# Patient Record
Sex: Male | Born: 1950 | Race: Black or African American | Hispanic: No | Marital: Single | Smoking: Never smoker
Health system: Southern US, Community
[De-identification: ages and names within clinical notes are randomized; demographics above are authoritative.]

## PROBLEM LIST (undated history)

## (undated) DIAGNOSIS — M1990 Unspecified osteoarthritis, unspecified site: Secondary | ICD-10-CM

## (undated) DIAGNOSIS — G4730 Sleep apnea, unspecified: Secondary | ICD-10-CM

## (undated) DIAGNOSIS — N529 Male erectile dysfunction, unspecified: Secondary | ICD-10-CM

## (undated) DIAGNOSIS — I509 Heart failure, unspecified: Secondary | ICD-10-CM

## (undated) DIAGNOSIS — E119 Type 2 diabetes mellitus without complications: Secondary | ICD-10-CM

## (undated) DIAGNOSIS — Z87442 Personal history of urinary calculi: Secondary | ICD-10-CM

## (undated) DIAGNOSIS — I10 Essential (primary) hypertension: Secondary | ICD-10-CM

## (undated) HISTORY — DX: Male erectile dysfunction, unspecified: N52.9

## (undated) HISTORY — PX: RENAL ARTERY STENT: SHX2321

## (undated) HISTORY — PX: JOINT REPLACEMENT: SHX530

## (undated) HISTORY — PX: CARDIAC CATHETERIZATION: SHX172

## (undated) HISTORY — DX: Heart failure, unspecified: I50.9

---

## 2001-02-12 HISTORY — PX: CORONARY ARTERY BYPASS GRAFT: SHX141

## 2014-07-28 ENCOUNTER — Encounter: Payer: Self-pay | Admitting: Emergency Medicine

## 2014-07-28 ENCOUNTER — Emergency Department
Admission: EM | Admit: 2014-07-28 | Discharge: 2014-07-28 | Disposition: A | Discharge status: Home / Self Care | Payer: Self-pay | Attending: Emergency Medicine | Admitting: Emergency Medicine

## 2014-07-28 DIAGNOSIS — Y9289 Other specified places as the place of occurrence of the external cause: Secondary | ICD-10-CM | POA: Insufficient documentation

## 2014-07-28 DIAGNOSIS — E119 Type 2 diabetes mellitus without complications: Secondary | ICD-10-CM | POA: Insufficient documentation

## 2014-07-28 DIAGNOSIS — S91202A Unspecified open wound of left great toe with damage to nail, initial encounter: Secondary | ICD-10-CM | POA: Insufficient documentation

## 2014-07-28 DIAGNOSIS — X58XXXA Exposure to other specified factors, initial encounter: Secondary | ICD-10-CM | POA: Insufficient documentation

## 2014-07-28 DIAGNOSIS — I10 Essential (primary) hypertension: Secondary | ICD-10-CM | POA: Insufficient documentation

## 2014-07-28 DIAGNOSIS — Y998 Other external cause status: Secondary | ICD-10-CM | POA: Insufficient documentation

## 2014-07-28 DIAGNOSIS — S91209A Unspecified open wound of unspecified toe(s) with damage to nail, initial encounter: Secondary | ICD-10-CM

## 2014-07-28 DIAGNOSIS — Y9389 Activity, other specified: Secondary | ICD-10-CM | POA: Insufficient documentation

## 2014-07-28 HISTORY — DX: Type 2 diabetes mellitus without complications: E11.9

## 2014-07-28 HISTORY — DX: Essential (primary) hypertension: I10

## 2014-07-28 MED ORDER — BACITRACIN-NEOMYCIN-POLYMYXIN OINTMENT TUBE
TOPICAL_OINTMENT | Freq: Once | CUTANEOUS | Status: AC
  Administered 2014-07-28: 1 via TOPICAL

## 2014-07-28 MED ORDER — SULFAMETHOXAZOLE-TRIMETHOPRIM 800-160 MG PO TABS
1.0000 | ORAL_TABLET | Freq: Once | ORAL | Status: AC
  Administered 2014-07-28: 1 via ORAL

## 2014-07-28 MED ORDER — BACITRACIN-NEOMYCIN-POLYMYXIN 400-5-5000 EX OINT
TOPICAL_OINTMENT | CUTANEOUS | Status: AC
  Filled 2014-07-28: qty 1

## 2014-07-28 MED ORDER — SULFAMETHOXAZOLE-TRIMETHOPRIM 800-160 MG PO TABS: 1.0000 | ORAL_TABLET | Freq: Two times a day (BID) | ORAL | Status: DC

## 2014-07-28 MED ORDER — SULFAMETHOXAZOLE-TRIMETHOPRIM 800-160 MG PO TABS
ORAL_TABLET | ORAL | Status: AC
  Administered 2014-07-28: 1 via ORAL
  Filled 2014-07-28: qty 1

## 2014-07-28 MED ORDER — NEOMYCIN-POLYMYXIN-PRAMOXINE 1 % EX CREA: TOPICAL_CREAM | Freq: Two times a day (BID) | CUTANEOUS | Status: DC

## 2014-07-28 NOTE — ED Notes (Signed)
Pt reports ingrown toenail 2 weeks ago, reports "ripping toenail off" about a week ago. Pt reports pain to left big toe, toe is bleeding. Bandage applied in triage.

## 2014-07-28 NOTE — Discharge Instructions (Signed)
Fingernail or Toenail Loss All or part of your fingernail or toenail has been lost. This may or may not grow back as a normal nail. A special non-stick bandage has been put on your finger or toe tightly to prevent bleeding. HOME CARE INSTRUCTIONS  The tips of fingers and toes are full of nerves and injuries are often very painful. The following will help you decrease the pain and obtain the best outcome.  Keep your hand or foot elevated above your heart to relieve pain and swelling. This will require lying in bed or on a couch with the hand or leg on pillows or sitting in a recliner with the leg up. Letting your hand or leg dangle may increase swelling, slow healing and cause throbbing pain.  Keep your dressing dry and clean.  Change your bandage in 24 hours after going home.  After your bandage is changed, soak your hand or foot in warm soapy water for 10 to 20 minutes. Do this 3 times per day. This helps reduce pain and swelling. After soaking, apply a clean, dry bandage. Change your bandage if it is wet or dirty.  Only take over-the-counter or prescription medicines for pain, discomfort, or fever as directed by your caregiver.  See your caregiver as needed for problems. SEEK IMMEDIATE MEDICAL CARE IF:   You have increased pain, swelling, drainage, or bleeding.  You have a fever. MAKE SURE YOU:   Understand these instructions.  Will watch your condition.  Will get help right away if you are not doing well or get worse. Document Released: 12/21/2005 Document Revised: 04/23/2011 Document Reviewed: 03/12/2006 Exit[employer] Patient Information 2015 [employer]. This information is not intended to replace advice given to you by your health care provider. Make sure you discuss any questions you have with your health care provider.  

## 2014-07-28 NOTE — ED Provider Notes (Signed)
[facility] Emergency Department Provider Note  ____________________________________________  Time seen: Approximately 7:37 PM  I have reviewed the triage vital signs and the nursing notes.   HISTORY  Chief Complaint Toe Pain    HPI Scott Hale is a 65 y.o. male patient's report have an ingrown toenail 2 weeks ago and he sized to take it out himself. Patient removed the whole nail on the left great toe. They he will he was healing well but he stopped bothering today and it started bleeding. He also noticed that there was more edema to the toe. Patient denies any pain at this time but he believes might be secondary to his neuropathy. Patient denies any fever or chills secondary to this complaint.   Past Medical History  Diagnosis Date  . Hypertension   . Diabetes mellitus without complication     There are no active problems to display for this patient.   Past Surgical History  Procedure Laterality Date  . Coronary artery bypass graft    . Joint replacement      Current Outpatient Rx  Name  Route  Sig  Dispense  Refill  . neomycin-polymyxin-pramoxine (NEOSPORIN PLUS) 1 % cream   Topical   Apply topically 2 (two) times daily.   14.2 g   0   . sulfamethoxazole-trimethoprim (BACTRIM DS,SEPTRA DS) 800-160 MG per tablet   Oral   Take 1 tablet by mouth 2 (two) times daily.   20 tablet   0     Allergies Review of patient's allergies indicates no known allergies.  No family history on file.  Social History History  Substance Use Topics  . Smoking status: Never Smoker   . Smokeless tobacco: Not on file  . Alcohol Use: No    Review of Systems Constitutional: No fever/chills Eyes: No visual changes. ENT: No sore throat. Cardiovascular: Denies chest pain. Respiratory: Denies shortness of breath. Gastrointestinal: No abdominal pain.  No nausea, no vomiting.  No diarrhea.  No constipation. Genitourinary: Negative for  dysuria. Musculoskeletal: Negative for back pain. Skin: Negative for rash. Erythema and edema to the left great toe. Neurological: Negative for headaches, focal weakness or numbness. Endocrine:Hypertension and diabetes  10-point ROS otherwise negative.  ____________________________________________   PHYSICAL EXAM:  VITAL SIGNS: ED Triage Vitals  Enc Vitals Group     BP 07/28/14 1921 147/95 mmHg     Pulse Rate 07/28/14 1921 82     Resp 07/28/14 1921 19     Temp 07/28/14 1921 98.4 F (36.9 C)     Temp Source 07/28/14 1921 Oral     SpO2 07/28/14 1921 100 %     Weight 07/28/14 1921 237 lb (107.502 kg)     Height 07/28/14 1921 6' (1.829 m)     Head Cir --      Peak Flow --      Pain Score 07/28/14 1922 0     Pain Loc --      Pain Edu? --      Excl. in GC? --    Constitutional: Alert and oriented. Well appearing and in no acute distress. Eyes: Conjunctivae are normal. PERRL. EOMI. Head: Atraumatic. Nose: No congestion/rhinnorhea. Mouth/Throat: Mucous membranes are moist.  Oropharynx non-erythematous. Neck: No stridor.  No deformity for nuchal range of motion nontender palpation. Hematological/Lymphatic/Immunilogical: No cervical lymphadenopathy. Cardiovascular: Normal rate, regular rhythm. Grossly normal heart sounds.  Good peripheral circulation. Respiratory: Normal respiratory effort.  No retractions. Lungs CTAB. Gastrointestinal: Soft and nontender. No distention.  No abdominal bruits. No CVA tenderness. Musculoskeletal: No lower extremity tenderness nor edema.  No joint effusions. Neurologic:  Normal speech and language. No gross focal neurologic deficits are appreciated. Speech is normal. No gait instability. Skin:  Skin is warm, dry and intact. No rash noted. Complete avulsion of the nail left great toe. There is erythema and edema. Psychiatric: Mood and affect are normal. Speech and behavior are normal.  ____________________________________________   LABS (all labs  ordered are listed, but only abnormal results are displayed)  Labs Reviewed - No data to display ____________________________________________  EKG   ____________________________________________  RADIOLOGY   ____________________________________________   PROCEDURES  Procedure(s) performed: None  Critical Care performed: No  ____________________________________________   INITIAL IMPRESSION / ASSESSMENT AND PLAN / ED COURSE  Pertinent labs & imaging results that were available during my care of the patient were reviewed by me and considered in my medical decision making (see chart for details).  Avulsion of the great left toe nail with cellulitis. ____________________________________________   FINAL CLINICAL IMPRESSION(S) / ED DIAGNOSES  Final diagnoses:  Nail avulsion of toe, initial encounter      Joni Reining, PA-C 07/28/14 1949  Maurilio Lovely, MD 07/29/14 9147

## 2015-12-26 DIAGNOSIS — E785 Hyperlipidemia, unspecified: Secondary | ICD-10-CM | POA: Diagnosis not present

## 2015-12-26 DIAGNOSIS — E119 Type 2 diabetes mellitus without complications: Secondary | ICD-10-CM | POA: Diagnosis not present

## 2015-12-26 DIAGNOSIS — M791 Myalgia: Secondary | ICD-10-CM | POA: Diagnosis not present

## 2015-12-26 DIAGNOSIS — I10 Essential (primary) hypertension: Secondary | ICD-10-CM | POA: Diagnosis not present

## 2015-12-26 DIAGNOSIS — Z125 Encounter for screening for malignant neoplasm of prostate: Secondary | ICD-10-CM | POA: Diagnosis not present

## 2015-12-27 DIAGNOSIS — Z125 Encounter for screening for malignant neoplasm of prostate: Secondary | ICD-10-CM | POA: Diagnosis not present

## 2015-12-27 DIAGNOSIS — E785 Hyperlipidemia, unspecified: Secondary | ICD-10-CM | POA: Diagnosis not present

## 2015-12-27 DIAGNOSIS — E119 Type 2 diabetes mellitus without complications: Secondary | ICD-10-CM | POA: Diagnosis not present

## 2015-12-27 DIAGNOSIS — I10 Essential (primary) hypertension: Secondary | ICD-10-CM | POA: Diagnosis not present

## 2016-02-09 DIAGNOSIS — D649 Anemia, unspecified: Secondary | ICD-10-CM | POA: Diagnosis not present

## 2016-03-19 DIAGNOSIS — E785 Hyperlipidemia, unspecified: Secondary | ICD-10-CM | POA: Diagnosis not present

## 2016-03-19 DIAGNOSIS — R5383 Other fatigue: Secondary | ICD-10-CM | POA: Diagnosis not present

## 2016-03-19 DIAGNOSIS — I10 Essential (primary) hypertension: Secondary | ICD-10-CM | POA: Diagnosis not present

## 2016-03-19 DIAGNOSIS — E119 Type 2 diabetes mellitus without complications: Secondary | ICD-10-CM | POA: Diagnosis not present

## 2016-03-19 DIAGNOSIS — N529 Male erectile dysfunction, unspecified: Secondary | ICD-10-CM | POA: Diagnosis not present

## 2016-03-19 DIAGNOSIS — R5381 Other malaise: Secondary | ICD-10-CM | POA: Diagnosis not present

## 2016-03-23 DIAGNOSIS — E349 Endocrine disorder, unspecified: Secondary | ICD-10-CM | POA: Diagnosis not present

## 2016-10-22 DIAGNOSIS — H52213 Irregular astigmatism, bilateral: Secondary | ICD-10-CM | POA: Diagnosis not present

## 2016-10-22 DIAGNOSIS — E113393 Type 2 diabetes mellitus with moderate nonproliferative diabetic retinopathy without macular edema, bilateral: Secondary | ICD-10-CM | POA: Diagnosis not present

## 2016-11-05 DIAGNOSIS — E1159 Type 2 diabetes mellitus with other circulatory complications: Secondary | ICD-10-CM | POA: Diagnosis not present

## 2016-11-11 ENCOUNTER — Emergency Department
Admission: EM | Admit: 2016-11-11 | Discharge: 2016-11-11 | Disposition: A | Discharge status: Home / Self Care | Payer: Medicare HMO | Attending: Emergency Medicine | Admitting: Emergency Medicine

## 2016-11-11 DIAGNOSIS — E119 Type 2 diabetes mellitus without complications: Secondary | ICD-10-CM | POA: Insufficient documentation

## 2016-11-11 DIAGNOSIS — X500XXA Overexertion from strenuous movement or load, initial encounter: Secondary | ICD-10-CM | POA: Diagnosis not present

## 2016-11-11 DIAGNOSIS — S76012A Strain of muscle, fascia and tendon of left hip, initial encounter: Secondary | ICD-10-CM | POA: Insufficient documentation

## 2016-11-11 DIAGNOSIS — Z9660 Presence of unspecified orthopedic joint implant: Secondary | ICD-10-CM | POA: Diagnosis not present

## 2016-11-11 DIAGNOSIS — G5702 Lesion of sciatic nerve, left lower limb: Secondary | ICD-10-CM | POA: Diagnosis not present

## 2016-11-11 DIAGNOSIS — M5432 Sciatica, left side: Secondary | ICD-10-CM

## 2016-11-11 DIAGNOSIS — Y999 Unspecified external cause status: Secondary | ICD-10-CM | POA: Insufficient documentation

## 2016-11-11 DIAGNOSIS — Y93B1 Activity, exercise machines primarily for muscle strengthening: Secondary | ICD-10-CM | POA: Insufficient documentation

## 2016-11-11 DIAGNOSIS — Y9239 Other specified sports and athletic area as the place of occurrence of the external cause: Secondary | ICD-10-CM | POA: Diagnosis not present

## 2016-11-11 DIAGNOSIS — I25810 Atherosclerosis of coronary artery bypass graft(s) without angina pectoris: Secondary | ICD-10-CM | POA: Insufficient documentation

## 2016-11-11 DIAGNOSIS — S3992XA Unspecified injury of lower back, initial encounter: Secondary | ICD-10-CM | POA: Diagnosis present

## 2016-11-11 DIAGNOSIS — S76312A Strain of muscle, fascia and tendon of the posterior muscle group at thigh level, left thigh, initial encounter: Secondary | ICD-10-CM | POA: Diagnosis not present

## 2016-11-11 DIAGNOSIS — I10 Essential (primary) hypertension: Secondary | ICD-10-CM | POA: Diagnosis not present

## 2016-11-11 MED ORDER — KETOROLAC TROMETHAMINE 10 MG PO TABS: 10.0000 mg | ORAL_TABLET | Freq: Three times a day (TID) | ORAL | 0 refills | Status: AC

## 2016-11-11 MED ORDER — BACLOFEN 5 MG PO TABS: 5.0000 mg | ORAL_TABLET | Freq: Three times a day (TID) | ORAL | 0 refills | Status: AC | PRN

## 2016-11-11 MED ORDER — KETOROLAC TROMETHAMINE 60 MG/2ML IM SOLN
30.0000 mg | Freq: Once | INTRAMUSCULAR | Status: AC
  Administered 2016-11-11: 30 mg via INTRAMUSCULAR
  Filled 2016-11-11: qty 2

## 2016-11-11 NOTE — ED Triage Notes (Signed)
Patient arrives c/o non-traumatic low back pain, described as "spasms"

## 2016-11-11 NOTE — Discharge Instructions (Signed)
Your exam is consistent with a strain to the muscles of the gluteal region, specifically, the piriformis muscle. Take the prescription meds as directed. Follow-up with ortho for continued symptoms. Perform the stretches as demonstrated.

## 2016-11-11 NOTE — ED Provider Notes (Signed)
[facility] Emergency Department Provider Note ____________________________________________  Time seen: 1903  I have reviewed the triage vital signs and the nursing notes.  HISTORY  Chief Complaint  Back Pain  HPI Scott Hale is a 66 y.o. male Presents to the ED for evaluation of pain to the low back and left high that he describes as spasms. The patient denies any frank injury, accident, or fall. He does admit to onset a few days after he was doing some leg presses and leg curls in the gym. He localizes pain to his left buttocks region with referral down the posterior lateral thigh. He describes pain is worse with transitioning from sitting to standing.He also describes intermittent catchy pain in the same region. He denies any distal paresthesias, foot drop, or incontinence. He has taken a couple of intermittent doses of Aleve without any lasting benefit.He denies any history of intermittent, chronic, ongoing back pain.  Past Medical History:  Diagnosis Date  . Diabetes mellitus without complication   . Hypertension    There are no active problems to display for this patient.  Past Surgical History:  Procedure Laterality Date  . CORONARY ARTERY BYPASS GRAFT    . JOINT REPLACEMENT      Prior to Admission medications   Medication Sig Start Date End Date Taking? Authorizing Provider  neomycin-polymyxin-pramoxine (NEOSPORIN PLUS) 1 % cream Apply topically 2 (two) times daily. 07/28/14   Joni Reining, PA-C  sulfamethoxazole-trimethoprim (BACTRIM DS,SEPTRA DS) 800-160 MG per tablet Take 1 tablet by mouth 2 (two) times daily. 07/28/14   Joni Reining, PA-C   Allergies Patient has no known allergies.  No family history on file.  Social History Social History  Substance Use Topics  . Smoking status: Never Smoker  . Smokeless tobacco: Not on file  . Alcohol use No    Review of Systems  Constitutional: Negative for fever. Cardiovascular: Negative for  chest pain. Respiratory: Negative for shortness of breath. Genitourinary: Negative for dysuria. Musculoskeletal: Negative for back pain. Left hip/leg pain as above Skin: Negative for rash. Neurological: Negative for headaches, focal weakness or numbness. ____________________________________________  PHYSICAL EXAM:  VITAL SIGNS: ED Triage Vitals  Enc Vitals Group     BP 11/11/16 1826 (!) 179/93     Pulse Rate 11/11/16 1826 (!) 59     Resp 11/11/16 1826 18     Temp 11/11/16 1826 99.1 F (37.3 C)     Temp Source 11/11/16 1826 Oral     SpO2 11/11/16 1826 98 %     Weight --      Height --      Head Circumference --      Peak Flow --      Pain Score 11/11/16 1824 8     Pain Loc --      Pain Edu? --      Excl. in GC? --     Constitutional: Alert and oriented. Well appearing and in no distress. Head: Normocephalic and atraumatic. Eyes: Conjunctivae are normal. Normal extraocular movements Cardiovascular: Normal rate, regular rhythm. Normal distal pulses. Respiratory: Normal respiratory effort. No wheezes/rales/rhonchi. Gastrointestinal: Soft and nontender. No distention. Musculoskeletal: normal spinal alignment without midline tenderness, spasm, deformity, or step-off. Patient with tenderness to palpation over the left mid gluteal region. He is tender to palpation over the lateral left hip as well. Pain is increased with hip flexion as well as supine straight leg raise. Nontender with normal range of motion in all extremities.  Neurologic:  Normal gait without ataxia. Normal speech and language. No gross focal neurologic deficits are appreciated. ___________________________________________   RADIOLOGY  deferred ____________________________________________  PROCEDURES  Toradol 30 mg IM ____________________________________________  INITIAL IMPRESSION / ASSESSMENT AND PLAN / ED COURSE  Patient ED evaluation of an acute piriformis syndrome on the left. He is discharged with a  prescription for ketorolac and baclofen to dose as directed. He will follow up with orthopod ongoing or worsening symptoms. ____________________________________________  FINAL CLINICAL IMPRESSION(S) / ED DIAGNOSES  Final diagnoses:  Muscle strain of gluteal region, left, initial encounter  Sciatica of left side  Piriformis syndrome of left side      Karmen Stabs, Charlesetta Ivory, PA-C 11/11/16 2044    Dionne Bucy, MD 11/12/16 [phone]

## 2016-11-12 ENCOUNTER — Telehealth: Payer: Self-pay | Admitting: Emergency Medicine

## 2016-11-12 NOTE — Telephone Encounter (Signed)
[facility] [place] [facility] called asking for alternative to toradol due to  Insurance coverage.  Per dr Roxan Hockey can changed to naproxen 375 mg twice daily for 10 days.

## 2017-03-13 ENCOUNTER — Telehealth: Payer: Self-pay | Admitting: Gastroenterology

## 2017-03-13 NOTE — Telephone Encounter (Signed)
Patient called to schedule colonoscopy.

## 2017-03-14 ENCOUNTER — Other Ambulatory Visit: Payer: Self-pay

## 2017-03-14 DIAGNOSIS — Z1211 Encounter for screening for malignant neoplasm of colon: Secondary | ICD-10-CM

## 2017-03-19 NOTE — Addendum Note (Signed)
Addended by: Ardyth ManARTER, Contessa Preuss Z on: 03/19/2017 10:34 AM   Modules accepted: Orders

## 2017-03-19 NOTE — Progress Notes (Signed)
gasrt

## 2017-03-24 ENCOUNTER — Emergency Department: Payer: Medicare HMO

## 2017-03-24 ENCOUNTER — Emergency Department
Admission: EM | Admit: 2017-03-24 | Discharge: 2017-03-24 | Disposition: A | Discharge status: Home / Self Care | Payer: Medicare HMO | Attending: Emergency Medicine | Admitting: Emergency Medicine

## 2017-03-24 ENCOUNTER — Encounter: Payer: Self-pay | Admitting: Emergency Medicine

## 2017-03-24 DIAGNOSIS — Z79899 Other long term (current) drug therapy: Secondary | ICD-10-CM | POA: Insufficient documentation

## 2017-03-24 DIAGNOSIS — I10 Essential (primary) hypertension: Secondary | ICD-10-CM | POA: Insufficient documentation

## 2017-03-24 DIAGNOSIS — R0602 Shortness of breath: Secondary | ICD-10-CM | POA: Insufficient documentation

## 2017-03-24 DIAGNOSIS — E119 Type 2 diabetes mellitus without complications: Secondary | ICD-10-CM | POA: Insufficient documentation

## 2017-03-24 DIAGNOSIS — R7989 Other specified abnormal findings of blood chemistry: Secondary | ICD-10-CM | POA: Diagnosis not present

## 2017-03-24 DIAGNOSIS — R21 Rash and other nonspecific skin eruption: Secondary | ICD-10-CM | POA: Diagnosis not present

## 2017-03-24 DIAGNOSIS — Z794 Long term (current) use of insulin: Secondary | ICD-10-CM | POA: Diagnosis not present

## 2017-03-24 DIAGNOSIS — Z7982 Long term (current) use of aspirin: Secondary | ICD-10-CM | POA: Diagnosis not present

## 2017-03-24 DIAGNOSIS — Z951 Presence of aortocoronary bypass graft: Secondary | ICD-10-CM | POA: Insufficient documentation

## 2017-03-24 DIAGNOSIS — B356 Tinea cruris: Secondary | ICD-10-CM | POA: Diagnosis not present

## 2017-03-24 LAB — COMPREHENSIVE METABOLIC PANEL
ALK PHOS: 62 U/L (ref 38–126)
ALT: 23 U/L (ref 17–63)
ANION GAP: 8 (ref 5–15)
AST: 28 U/L (ref 15–41)
Albumin: 3.8 g/dL (ref 3.5–5.0)
BILIRUBIN TOTAL: 0.7 mg/dL (ref 0.3–1.2)
BUN: 10 mg/dL (ref 6–20)
CALCIUM: 9.2 mg/dL (ref 8.9–10.3)
CO2: 25 mmol/L (ref 22–32)
Chloride: 105 mmol/L (ref 101–111)
Creatinine, Ser: 1.2 mg/dL (ref 0.61–1.24)
GFR calc Af Amer: >60 mL/min (ref >60)
GFR calc non Af Amer: >60 mL/min (ref >60)
Glucose, Bld: 134 mg/dL — ABNORMAL HIGH (ref 65–99)
Potassium: 4 mmol/L (ref 3.5–5.1)
Sodium: 138 mmol/L (ref 135–145)
TOTAL PROTEIN: 7.5 g/dL (ref 6.5–8.1)

## 2017-03-24 LAB — CBC
HEMATOCRIT: 38.7 % — AB (ref 40.0–52.0)
Hemoglobin: 12.8 g/dL — ABNORMAL LOW (ref 13.0–18.0)
MCH: 29.2 pg (ref 26.0–34.0)
MCHC: 33.2 g/dL (ref 32.0–36.0)
MCV: 87.9 fL (ref 80.0–100.0)
Platelets: 211 10*3/uL (ref 150–440)
RBC: 4.4 MIL/uL (ref 4.40–5.90)
RDW: 14 % (ref 11.5–14.5)
WBC: 6.2 10*3/uL (ref 3.8–10.6)

## 2017-03-24 LAB — BRAIN NATRIURETIC PEPTIDE: B Natriuretic Peptide: 185 pg/mL — ABNORMAL HIGH (ref 0.0–100.0)

## 2017-03-24 MED ORDER — CLOTRIMAZOLE-BETAMETHASONE 1-0.05 % EX CREA: TOPICAL_CREAM | CUTANEOUS | 0 refills | Status: DC

## 2017-03-24 NOTE — ED Provider Notes (Signed)
EKG read and interpreted by me shows normal sinus rhythm rate of 60 normal axis nonspecific ST-T wave changes   Arnaldo NatalMalinda, Paul F, MD 03/24/17 1242

## 2017-03-24 NOTE — ED Notes (Signed)
FIRST NURSE NOTE: pt states he tried some just for men and is having a rash, no noted hives or distress on arrival.

## 2017-03-24 NOTE — ED Triage Notes (Signed)
Pt reports using "just for men" and reports rash to face. Also reports starting a new diabetic medication and now has discoloration to his right upper thigh.

## 2017-03-24 NOTE — ED Provider Notes (Signed)
[facility] Emergency Department Provider Note  ____________________________________________  Time seen: Approximately 1:40 PM  I have reviewed the triage vital signs and the nursing notes.   HISTORY  Chief Complaint Rash    HPI Scott Hale is a 67 y.o. male that presents emergency department for evaluation of "scarring" to chin from beard dye for several weeks and rash to right inner thigh for 1 week.  Patient states that he saw a commercial on TV that he should get documentation if your beard dye is causing problems.  He has been using this dye for "a long time."  He also has a rash on his inner thigh that itches.  He has never had this before.  He also wants to discuss that for several months he also is noticing increased shortness of breath with exercise. Sometimes he gets out of breath just walking to the mailbox. He has not had any shortness of breath today. He does not have any SOB at rest.  He exercises daily.  He does not smoke. No cough, CP, nausea, vomiting, abdominal pain.  Past Medical History:  Diagnosis Date  . Diabetes mellitus without complication (HCC)   . Hypertension     There are no active problems to display for this patient.   Past Surgical History:  Procedure Laterality Date  . CORONARY ARTERY BYPASS GRAFT    . JOINT REPLACEMENT      Prior to Admission medications   Medication Sig Start Date End Date Taking? Authorizing Provider  ASPIRIN 81 PO Take by mouth.    [provider]  atorvastatin (LIPITOR) 40 MG tablet TAKE 1 TABLET ONE TIME DAILY 08/07/16   [provider]  clotrimazole-betamethasone (LOTRISONE) cream Apply to affected area 2 times daily 03/24/17 03/24/18  Enid Derry, PA-C  CREATINE MONOHYDRATE PO Use 1 Scoop once daily.    [provider]  glipiZIDE (GLUCOTROL) 10 MG tablet TAKE 1 TABLET EVERY MORNING BEFORE BREAKFAST 08/07/16   [provider]  insulin detemir (LEVEMIR) 100  UNIT/ML injection Inject into the skin. 08/20/16   [provider]  lisinopril (PRINIVIL,ZESTRIL) 20 MG tablet Take by mouth. 03/19/16 03/19/17  [provider]  neomycin-polymyxin-pramoxine (NEOSPORIN PLUS) 1 % cream Apply topically 2 (two) times daily. 07/28/14   Joni Reining, PA-C  sulfamethoxazole-trimethoprim (BACTRIM DS,SEPTRA DS) 800-160 MG per tablet Take 1 tablet by mouth 2 (two) times daily. 07/28/14   Joni Reining, PA-C    Allergies Patient has no known allergies.  No family history on file.  Social History Social History   Tobacco Use  . Smoking status: Never Smoker  Substance Use Topics  . Alcohol use: No  . Drug use: No     Review of Systems  Constitutional: No fever/chills Cardiovascular: No chest pain. Respiratory: No cough.  Gastrointestinal: No abdominal pain.  No nausea, no vomiting.  Musculoskeletal: Negative for musculoskeletal pain. Skin: Negative for abrasions, lacerations, ecchymosis.   ____________________________________________   PHYSICAL EXAM:  VITAL SIGNS: ED Triage Vitals  Enc Vitals Group     BP 03/24/17 1104 (!) 189/84     Pulse Rate 03/24/17 1104 74     Resp 03/24/17 1104 18     Temp 03/24/17 1104 98.3 F (36.8 C)     Temp Source 03/24/17 1104 Oral     SpO2 03/24/17 1104 100 %     Weight 03/24/17 1112 244 lb (110.7 kg)     Height 03/24/17 1112 6' (1.829 m)  Head Circumference --      Peak Flow --      Pain Score 03/24/17 1112 2     Pain Loc --      Pain Edu? --      Excl. in GC? --      Constitutional: Alert and oriented. Well appearing and in no acute distress. Eyes: Conjunctivae are normal. PERRL. EOMI. Head: Atraumatic. No rash to face noted.  ENT:      Ears:      Nose: No congestion/rhinnorhea.      Mouth/Throat: Mucous membranes are moist.  Neck: No stridor.   Cardiovascular: Normal rate, regular rhythm.  Good peripheral circulation. Respiratory: Normal respiratory effort without tachypnea or  retractions. Lungs CTAB. Good air entry to the bases with no decreased or absent breath sounds. Gastrointestinal: Bowel sounds 4 quadrants. Soft and nontender to palpation. No guarding or rigidity. No palpable masses. No distention. Musculoskeletal: Full range of motion to all extremities. No gross deformities appreciated. Dark scaling patch to inside of right thigh.  No edema to legs. Neurologic:  Normal speech and language. No gross focal neurologic deficits are appreciated.  Skin:  Skin is warm, dry and intact. No rash to face noted.   ____________________________________________   LABS (all labs ordered are listed, but only abnormal results are displayed)  Labs Reviewed  BRAIN NATRIURETIC PEPTIDE - Abnormal; Notable for the following components:      Result Value   B Natriuretic Peptide 185.0 (*)    All other components within normal limits  CBC - Abnormal; Notable for the following components:   Hemoglobin 12.8 (*)    HCT 38.7 (*)    All other components within normal limits  COMPREHENSIVE METABOLIC PANEL - Abnormal; Notable for the following components:   Glucose, Bld 134 (*)    All other components within normal limits   ____________________________________________  EKG   ____________________________________________  RADIOLOGY Lexine Baton, personally viewed and evaluated these images (plain radiographs) as part of my medical decision making, as well as reviewing the written report by the radiologist.  No results found.  ____________________________________________    PROCEDURES  Procedure(s) performed:    Procedures    Medications - No data to display   ____________________________________________   INITIAL IMPRESSION / ASSESSMENT AND PLAN / ED COURSE  Pertinent labs & imaging results that were available during my care of the patient were reviewed by me and considered in my medical decision making (see chart for details).  Review of the [place] CSRS  was performed in accordance of the [facility] prior to dispensing any controlled drugs.   Patient presented to the emergency department for evaluation of "scarring" to face after using beard dye. No visible scarring.  Rash on thigh is consistent with tinea cruris.  Patient also mentioned that he has noticed increase of SOB recently. No SOB today.  BNP was elevated on lab work.  Patient will follow up this week with heart failure clinic.  No EKG changes.  Chest x-ray negative for acute cardiopulmonary processes.  Vital signs and exam are reassuring.  Patient will be discharged home with prescriptions for clotrimazole. Patient is to follow up with PCP, the heart failure clinic and dermatology as directed. Patient is given ED precautions to return to the ED for any worsening or new symptoms.     ____________________________________________  FINAL CLINICAL IMPRESSION(S) / ED DIAGNOSES  Final diagnoses:  Tinea cruris  Elevated brain natriuretic peptide (BNP) level  Rash  NEW MEDICATIONS STARTED DURING THIS VISIT:  ED Discharge Orders        Ordered    clotrimazole-betamethasone (LOTRISONE) cream     03/24/17 1520          This chart was dictated using voice recognition software/Dragon. Despite best efforts to proofread, errors can occur which can change the meaning. Any change was purely unintentional.    Enid DerryWagner, Jaiyden Laur, PA-C 03/25/17 1518    Jeanmarie PlantMcShane, James A, MD 03/27/17 [phone]

## 2017-03-26 ENCOUNTER — Encounter: Payer: Self-pay | Admitting: Family

## 2017-03-26 ENCOUNTER — Ambulatory Visit: Payer: Medicare HMO | Attending: Family | Admitting: Family

## 2017-03-26 VITALS — BP 161/82 | HR 63 | Resp 18 | Ht 72.0 in | Wt 238.5 lb

## 2017-03-26 DIAGNOSIS — I509 Heart failure, unspecified: Secondary | ICD-10-CM | POA: Diagnosis not present

## 2017-03-26 DIAGNOSIS — Z794 Long term (current) use of insulin: Secondary | ICD-10-CM | POA: Diagnosis not present

## 2017-03-26 DIAGNOSIS — Z811 Family history of alcohol abuse and dependence: Secondary | ICD-10-CM | POA: Insufficient documentation

## 2017-03-26 DIAGNOSIS — Z9660 Presence of unspecified orthopedic joint implant: Secondary | ICD-10-CM | POA: Insufficient documentation

## 2017-03-26 DIAGNOSIS — Z833 Family history of diabetes mellitus: Secondary | ICD-10-CM | POA: Insufficient documentation

## 2017-03-26 DIAGNOSIS — Z79899 Other long term (current) drug therapy: Secondary | ICD-10-CM | POA: Diagnosis not present

## 2017-03-26 DIAGNOSIS — E119 Type 2 diabetes mellitus without complications: Secondary | ICD-10-CM

## 2017-03-26 DIAGNOSIS — Z951 Presence of aortocoronary bypass graft: Secondary | ICD-10-CM | POA: Insufficient documentation

## 2017-03-26 DIAGNOSIS — I10 Essential (primary) hypertension: Secondary | ICD-10-CM

## 2017-03-26 DIAGNOSIS — Z8249 Family history of ischemic heart disease and other diseases of the circulatory system: Secondary | ICD-10-CM | POA: Insufficient documentation

## 2017-03-26 DIAGNOSIS — N529 Male erectile dysfunction, unspecified: Secondary | ICD-10-CM | POA: Insufficient documentation

## 2017-03-26 DIAGNOSIS — I110 Hypertensive heart disease with heart failure: Secondary | ICD-10-CM | POA: Diagnosis not present

## 2017-03-26 DIAGNOSIS — Z7982 Long term (current) use of aspirin: Secondary | ICD-10-CM | POA: Insufficient documentation

## 2017-03-26 DIAGNOSIS — R0683 Snoring: Secondary | ICD-10-CM | POA: Diagnosis not present

## 2017-03-26 NOTE — Patient Instructions (Addendum)
Continue weighing daily and call for an overnight weight gain of > 2 pounds or a weekly weight gain of >5 pounds.   Follow up with Dr. Lady Hale on 04/10/2017 at 2:45pm    Low-Sodium Eating Plan Sodium, which is an element that makes up salt, helps you maintain a healthy balance of fluids in your body. Too much sodium can increase your blood pressure and cause fluid and waste to be held in your body. Your health care provider or dietitian may recommend following this plan if you have high blood pressure (hypertension), kidney disease, liver disease, or heart failure. Eating less sodium can help lower your blood pressure, reduce swelling, and protect your heart, liver, and kidneys. What are tips for following this plan? General guidelines  Most people on this plan should limit their sodium intake to 2,000 mg (milligrams) of sodium each day. Reading food labels  The Nutrition Facts label lists the amount of sodium in one serving of the food. If you eat more than one serving, you must multiply the listed amount of sodium by the number of servings.  Choose foods with less than 140 mg of sodium per serving.  Avoid foods with 300 mg of sodium or more per serving. Shopping  Look for lower-sodium products, often labeled as "low-sodium" or "no salt added."  Always check the sodium content even if foods are labeled as "unsalted" or "no salt added".  Buy fresh foods. ? Avoid canned foods and premade or frozen meals. ? Avoid canned, cured, or processed meats  Buy breads that have less than 80 mg of sodium per slice. Cooking  Eat more home-cooked food and less restaurant, buffet, and fast food.  Avoid adding salt when cooking. Use salt-free seasonings or herbs instead of table salt or sea salt. Check with your health care provider or pharmacist before using salt substitutes.  Cook with plant-based oils, such as canola, sunflower, or olive oil. Meal planning  When eating at a restaurant, ask that  your food be prepared with less salt or no salt, if possible.  Avoid foods that contain MSG (monosodium glutamate). MSG is sometimes added to [place] food, bouillon, and some canned foods. What foods are recommended? The items listed may not be a complete list. Talk with your dietitian about what dietary choices are best for you. Grains Low-sodium cereals, including oats, puffed wheat and rice, and shredded wheat. Low-sodium crackers. Unsalted rice. Unsalted pasta. Low-sodium bread. Whole-grain breads and whole-grain pasta. Vegetables Fresh or frozen vegetables. "No salt added" canned vegetables. "No salt added" tomato sauce and paste. Low-sodium or reduced-sodium tomato and vegetable juice. Fruits Fresh, frozen, or canned fruit. Fruit juice. Meats and other protein foods Fresh or frozen (no salt added) meat, poultry, seafood, and fish. Low-sodium canned tuna and salmon. Unsalted nuts. Dried peas, beans, and lentils without added salt. Unsalted canned beans. Eggs. Unsalted nut butters. Dairy Milk. Soy milk. Cheese that is naturally low in sodium, such as ricotta cheese, fresh mozzarella, or Swiss cheese Low-sodium or reduced-sodium cheese. Cream cheese. Yogurt. Fats and oils Unsalted butter. Unsalted margarine with no trans fat. Vegetable oils such as canola or olive oils. Seasonings and other foods Fresh and dried herbs and spices. Salt-free seasonings. Low-sodium mustard and ketchup. Sodium-free salad dressing. Sodium-free light mayonnaise. Fresh or refrigerated horseradish. Lemon juice. Vinegar. Homemade, reduced-sodium, or low-sodium soups. Unsalted popcorn and pretzels. Low-salt or salt-free chips. What foods are not recommended? The items listed may not be a complete list. Talk with your dietitian  about what dietary choices are best for you. Grains Instant hot cereals. Bread stuffing, pancake, and biscuit mixes. Croutons. Seasoned rice or pasta mixes. Noodle soup cups. Boxed or frozen  macaroni and cheese. Regular salted crackers. Self-rising flour. Vegetables Sauerkraut, pickled vegetables, and relishes. Olives. [place] fries. Onion rings. Regular canned vegetables (not low-sodium or reduced-sodium). Regular canned tomato sauce and paste (not low-sodium or reduced-sodium). Regular tomato and vegetable juice (not low-sodium or reduced-sodium). Frozen vegetables in sauces. Meats and other protein foods Meat or fish that is salted, canned, smoked, spiced, or pickled. Bacon, ham, sausage, hotdogs, corned beef, chipped beef, packaged lunch meats, salt pork, jerky, pickled herring, anchovies, regular canned tuna, sardines, salted nuts. Dairy Processed cheese and cheese spreads. Cheese curds. Blue cheese. Feta cheese. String cheese. Regular cottage cheese. Buttermilk. Canned milk. Fats and oils Salted butter. Regular margarine. Ghee. Bacon fat. Seasonings and other foods Onion salt, garlic salt, seasoned salt, table salt, and sea salt. Canned and packaged gravies. Worcestershire sauce. Tartar sauce. Barbecue sauce. Teriyaki sauce. Soy sauce, including reduced-sodium. Steak sauce. Fish sauce. Oyster sauce. Cocktail sauce. Horseradish that you find on the shelf. Regular ketchup and mustard. Meat flavorings and tenderizers. Bouillon cubes. Hot sauce and Tabasco sauce. Premade or packaged marinades. Premade or packaged taco seasonings. Relishes. Regular salad dressings. Salsa. Potato and tortilla chips. Corn chips and puffs. Salted popcorn and pretzels. Canned or dried soups. Pizza. Frozen entrees and pot pies. Summary  Eating less sodium can help lower your blood pressure, reduce swelling, and protect your heart, liver, and kidneys.  Most people on this plan should limit their sodium intake to 1,500-2,000 mg (milligrams) of sodium each day.  Canned, boxed, and frozen foods are high in sodium. Restaurant foods, fast foods, and pizza are also very high in sodium. You also get sodium by adding  salt to food.  Try to cook at home, eat more fresh fruits and vegetables, and eat less fast food, canned, processed, or prepared foods. This information is not intended to replace advice given to you by your health care provider. Make sure you discuss any questions you have with your health care provider. Document Released: 07/21/2001 Document Revised: 01/23/2016 Document Reviewed: 01/23/2016 Elsevier Interactive Patient Education  [employer].

## 2017-03-26 NOTE — Progress Notes (Signed)
Patient ID: Scott Hale, male    DOB: 1950/11/21, 67 y.o.   MRN: [id number]  HPI  Mr Strubel is a 67 y/o male with a history of diabetes, HTN and chronic heart failure.   There is no echo report.  Was in the ED 03/24/17 due to shortness of breath and a rash. BNP was elevated. He was treated and released.   He presents today for his initial visit with a chief complaint of minimal shortness of breath upon moderate exertion. He describes this as chronic having been present for several months with varying levels of severity. He has associated fatigue, light-headedness, intermittent chest pain and chronic difficulty sleeping along with this. He denies any cough, edema, abdominal distention or weight gain. Does endorse snoring.   Past Medical History:  Diagnosis Date  . CHF (congestive heart failure) (HCC)   . Diabetes mellitus without complication (HCC)   . Erectile dysfunction   . Hypertension    Past Surgical History:  Procedure Laterality Date  . CORONARY ARTERY BYPASS GRAFT  2003  . JOINT REPLACEMENT     Family History  Problem Relation Age of Onset  . Diabetes Mother   . Alcoholism Father   . Diabetes Sister   . Diabetes Sister   . Arrhythmia Sister   . Diabetes Sister    Social History   Tobacco Use  . Smoking status: Never Smoker  . Smokeless tobacco: Never Used  Substance Use Topics  . Alcohol use: No   No Known Allergies Prior to Admission medications   Medication Sig Start Date End Date Taking? Authorizing Provider  ASPIRIN 81 PO Take by mouth.   Yes [provider]  atorvastatin (LIPITOR) 40 MG tablet TAKE 1 TABLET ONE TIME DAILY 08/07/16  Yes [provider]  clotrimazole-betamethasone (LOTRISONE) cream Apply to affected area 2 times daily 03/24/17 03/24/18 Yes Enid Derry, PA-C  glipiZIDE (GLUCOTROL) 10 MG tablet TAKE 1 TABLET EVERY MORNING BEFORE BREAKFAST 08/07/16  Yes [provider]  insulin detemir (LEVEMIR) 100 UNIT/ML injection  Inject into the skin. 08/20/16  Yes [provider]  lisinopril (PRINIVIL,ZESTRIL) 20 MG tablet Take by mouth. 03/19/16 03/26/17 Yes [provider]    Review of Systems  Constitutional: Positive for fatigue. Negative for appetite change.  HENT: Negative for congestion, postnasal drip and sore throat.   Eyes: Negative.   Respiratory: Positive for shortness of breath. Negative for cough and chest tightness.   Cardiovascular: Positive for chest pain (when lifting heavy weights). Negative for leg swelling.  Gastrointestinal: Negative for abdominal distention and abdominal pain.  Endocrine: Negative.   Genitourinary: Negative.   Musculoskeletal: Negative for back pain and neck pain.  Skin: Negative.   Allergic/Immunologic: Negative.   Neurological: Positive for light-headedness (sometimes). Negative for dizziness.  Hematological: Negative for adenopathy. Does not bruise/bleed easily.  Psychiatric/Behavioral: Positive for sleep disturbance. Negative for dysphoric mood. The patient is not nervous/anxious.     Vitals:   03/26/17 1306  BP: (!) 161/82  Pulse: 63  Resp: 18  SpO2: 100%  Weight: 238 lb 8 oz (108.2 kg)  Height: 6' (1.829 m)   Wt Readings from Last 3 Encounters:  03/26/17 238 lb 8 oz (108.2 kg)  03/24/17 244 lb (110.7 kg)  07/28/14 237 lb (107.5 kg)   Lab Results  Component Value Date   CREATININE 1.20 03/24/2017     Physical Exam  Constitutional: He is oriented to person, place, and time. He appears well-developed and well-nourished.  HENT:  Head: Normocephalic and atraumatic.  Neck: Normal range of motion. Neck supple. No JVD present.  Cardiovascular: Normal rate and regular rhythm.  Pulmonary/Chest: Effort normal. He has no wheezes. He has no rales.  Abdominal: Soft. He exhibits no distension. There is no tenderness.  Musculoskeletal: He exhibits no edema or tenderness.  Neurological: He is alert and oriented to person, place, and time.  Skin:  Skin is warm and dry.  Psychiatric: He has a normal mood and affect. His behavior is normal. Thought content normal.  Nursing note and vitals reviewed.  Assessment & Plan:  1: Chronic heart failure with unknown ejection fraction- - NYHA class II - euvolemic today - weighing daily and he was reminded to call for an overnight weight gain of >2 pounds or a weekly weight gain of >5 pounds - not adding salt to his food. Reviewed the importance of closely following a 2000mg  sodium diet and written dietary information was given to him about this. Reviewed how to read food labels. Did admit to eating onion rings and hot dogs last night. - BNP 03/24/17 was 185.0 - is quite active and goes to the gym for about an hour every day - echo scheduled - cardiology (Fath) follow-up scheduled on 04/10/17  2: HTN- - BP mildly elevated but he admits that he was a little anxious about coming today - continue to monitor - sees PCP Burnett Sheng([facility]) tomorrow - BMP on 03/24/17 reviewed and showed sodium 138, potassium 4.0 and GFR >60  3: Diabetes-  - nonfasting glucose at home was 149 - A1c on 11/05/16 was 7.1% - drank sweet tea last night  4: Snoring- - agreeable to a sleep study as this could be contributing to his HTN  Medications were reviewed.  Return in 1 month or sooner for any questions/problems before then.

## 2017-03-27 DIAGNOSIS — E119 Type 2 diabetes mellitus without complications: Secondary | ICD-10-CM | POA: Diagnosis not present

## 2017-03-27 DIAGNOSIS — I509 Heart failure, unspecified: Secondary | ICD-10-CM | POA: Diagnosis not present

## 2017-03-27 DIAGNOSIS — L989 Disorder of the skin and subcutaneous tissue, unspecified: Secondary | ICD-10-CM | POA: Diagnosis not present

## 2017-03-27 DIAGNOSIS — E785 Hyperlipidemia, unspecified: Secondary | ICD-10-CM | POA: Diagnosis not present

## 2017-03-27 DIAGNOSIS — Z794 Long term (current) use of insulin: Secondary | ICD-10-CM | POA: Diagnosis not present

## 2017-03-27 DIAGNOSIS — I10 Essential (primary) hypertension: Secondary | ICD-10-CM | POA: Diagnosis not present

## 2017-03-27 DIAGNOSIS — Z125 Encounter for screening for malignant neoplasm of prostate: Secondary | ICD-10-CM | POA: Diagnosis not present

## 2017-03-28 ENCOUNTER — Encounter: Payer: Self-pay | Admitting: Family

## 2017-03-28 ENCOUNTER — Ambulatory Visit: Payer: Medicare HMO

## 2017-03-28 DIAGNOSIS — I509 Heart failure, unspecified: Secondary | ICD-10-CM | POA: Insufficient documentation

## 2017-03-28 DIAGNOSIS — R0683 Snoring: Secondary | ICD-10-CM | POA: Insufficient documentation

## 2017-03-28 DIAGNOSIS — E119 Type 2 diabetes mellitus without complications: Secondary | ICD-10-CM | POA: Insufficient documentation

## 2017-03-28 DIAGNOSIS — I10 Essential (primary) hypertension: Secondary | ICD-10-CM | POA: Insufficient documentation

## 2017-04-01 ENCOUNTER — Telehealth: Payer: Self-pay | Admitting: Gastroenterology

## 2017-04-01 NOTE — Telephone Encounter (Signed)
Patient called & would like to know where he needs to go for his procedure on 04-03-17.

## 2017-04-02 NOTE — Telephone Encounter (Signed)
LVM for patient informing him where to go for his colonoscopy and if he had any questions to call our office back.

## 2017-04-09 ENCOUNTER — Encounter: Payer: Self-pay | Admitting: *Deleted

## 2017-04-09 ENCOUNTER — Other Ambulatory Visit: Payer: Self-pay

## 2017-04-09 ENCOUNTER — Encounter
Admission: RE | Disposition: A | Discharge status: Home / Self Care | Payer: Self-pay | Source: Ambulatory Visit | Attending: Gastroenterology

## 2017-04-09 ENCOUNTER — Ambulatory Visit
Admission: RE | Admit: 2017-04-09 | Discharge: 2017-04-09 | Disposition: A | Discharge status: Home / Self Care | Payer: Medicare HMO | Source: Ambulatory Visit | Attending: Gastroenterology | Admitting: Gastroenterology

## 2017-04-09 ENCOUNTER — Ambulatory Visit: Payer: Medicare HMO | Admitting: Anesthesiology

## 2017-04-09 DIAGNOSIS — I110 Hypertensive heart disease with heart failure: Secondary | ICD-10-CM | POA: Insufficient documentation

## 2017-04-09 DIAGNOSIS — Z951 Presence of aortocoronary bypass graft: Secondary | ICD-10-CM | POA: Diagnosis not present

## 2017-04-09 DIAGNOSIS — Z6832 Body mass index (BMI) 32.0-32.9, adult: Secondary | ICD-10-CM | POA: Insufficient documentation

## 2017-04-09 DIAGNOSIS — R195 Other fecal abnormalities: Secondary | ICD-10-CM | POA: Diagnosis not present

## 2017-04-09 DIAGNOSIS — I509 Heart failure, unspecified: Secondary | ICD-10-CM | POA: Insufficient documentation

## 2017-04-09 DIAGNOSIS — Z79899 Other long term (current) drug therapy: Secondary | ICD-10-CM | POA: Insufficient documentation

## 2017-04-09 DIAGNOSIS — Z1211 Encounter for screening for malignant neoplasm of colon: Secondary | ICD-10-CM

## 2017-04-09 DIAGNOSIS — E669 Obesity, unspecified: Secondary | ICD-10-CM | POA: Insufficient documentation

## 2017-04-09 DIAGNOSIS — Z7982 Long term (current) use of aspirin: Secondary | ICD-10-CM | POA: Insufficient documentation

## 2017-04-09 DIAGNOSIS — Z538 Procedure and treatment not carried out for other reasons: Secondary | ICD-10-CM | POA: Diagnosis not present

## 2017-04-09 DIAGNOSIS — I2510 Atherosclerotic heart disease of native coronary artery without angina pectoris: Secondary | ICD-10-CM | POA: Diagnosis not present

## 2017-04-09 DIAGNOSIS — E119 Type 2 diabetes mellitus without complications: Secondary | ICD-10-CM | POA: Insufficient documentation

## 2017-04-09 DIAGNOSIS — Z794 Long term (current) use of insulin: Secondary | ICD-10-CM | POA: Insufficient documentation

## 2017-04-09 HISTORY — PX: COLONOSCOPY WITH PROPOFOL: SHX5780

## 2017-04-09 LAB — GLUCOSE, CAPILLARY: Glucose-Capillary: 75 mg/dL (ref 65–99)

## 2017-04-09 SURGERY — COLONOSCOPY WITH PROPOFOL
Anesthesia: General

## 2017-04-09 MED ORDER — SODIUM CHLORIDE 0.9 % IV SOLN
INTRAVENOUS | Status: DC
  Administered 2017-04-09: 09:00:00 via INTRAVENOUS

## 2017-04-09 MED ORDER — LIDOCAINE HCL (PF) 2 % IJ SOLN
INTRAMUSCULAR | Status: AC
  Filled 2017-04-09: qty 10

## 2017-04-09 MED ORDER — PROPOFOL 10 MG/ML IV BOLUS
INTRAVENOUS | Status: AC
  Filled 2017-04-09: qty 20

## 2017-04-09 MED ORDER — LIDOCAINE HCL (PF) 1 % IJ SOLN
INTRAMUSCULAR | Status: AC
  Administered 2017-04-09: 09:00:00
  Filled 2017-04-09: qty 2

## 2017-04-09 MED ORDER — PROPOFOL 10 MG/ML IV BOLUS
INTRAVENOUS | Status: DC | PRN
  Administered 2017-04-09: 90 mg via INTRAVENOUS

## 2017-04-09 MED ORDER — LIDOCAINE HCL (CARDIAC) 20 MG/ML IV SOLN
INTRAVENOUS | Status: DC | PRN
  Administered 2017-04-09: 50 mg via INTRAVENOUS

## 2017-04-09 NOTE — H&P (Signed)
     Scott MoodKiran Gerrit Rafalski, MD [address], [address], [place], [place], [id number] [address], [address], [place], [place], [address] Phone: [phone]  Fax: [phone]  Primary Care Physician:  Jerl MinaHedrick, James, MD   Pre-Procedure History & Physical: HPI:  Scott Hale is a 67 y.o. male is here for an colonoscopy.   Past Medical History:  Diagnosis Date  . CHF (congestive heart failure) (HCC)   . Diabetes mellitus without complication (HCC)   . Erectile dysfunction   . Hypertension     Past Surgical History:  Procedure Laterality Date  . CORONARY ARTERY BYPASS GRAFT  2003  . JOINT REPLACEMENT      Prior to Admission medications   Medication Sig Start Date End Date Taking? Authorizing Provider  ASPIRIN 81 PO Take by mouth.   Yes [provider]  glipiZIDE (GLUCOTROL) 10 MG tablet TAKE 1 TABLET EVERY MORNING BEFORE BREAKFAST 08/07/16  Yes [provider]  insulin detemir (LEVEMIR) 100 UNIT/ML injection Inject into the skin. 08/20/16  Yes [provider]  lisinopril (PRINIVIL,ZESTRIL) 20 MG tablet Take by mouth. 03/19/16 04/09/17 Yes [provider]    Allergies as of 03/15/2017  . (No Known Allergies)    Family History  Problem Relation Age of Onset  . Diabetes Mother   . Alcoholism Father   . Diabetes Sister   . Diabetes Sister   . Arrhythmia Sister   . Diabetes Sister     Social History   Socioeconomic History  . Marital status: Single    Spouse name: Not on file  . Number of children: 5  . Years of education: 3512  . Highest education level: 12th grade  Social Needs  . Financial resource strain: Somewhat hard  . Food insecurity - worry: Sometimes true  . Food insecurity - inability: Never true  . Transportation needs - medical: No  . Transportation needs - non-medical: No  Occupational History  . Not on file  Tobacco Use  . Smoking status: Never Smoker  . Smokeless tobacco: Never Used  Substance and Sexual Activity  .  Alcohol use: No  . Drug use: No  . Sexual activity: Yes    Birth control/protection: Condom  Other Topics Concern  . Not on file  Social History Narrative  . Not on file    Review of Systems: See HPI, otherwise negative ROS  Physical Exam: BP (!) 162/95   Pulse 66   Temp (!) 97 F (36.1 C) (Tympanic)   Resp 18   SpO2 100%  General:   Alert,  pleasant and cooperative in NAD Head:  Normocephalic and atraumatic. Neck:  Supple; no masses or thyromegaly. Lungs:  Clear throughout to auscultation, normal respiratory effort.    Heart:  +S1, +S2, Regular rate and rhythm, No edema. Abdomen:  Soft, nontender and nondistended. Normal bowel sounds, without guarding, and without rebound.   Neurologic:  Alert and  oriented x4;  grossly normal neurologically.  Impression/Plan: Scott Hale is here for an colonoscopy to be performed for Screening colonoscopy average risk   Risks, benefits, limitations, and alternatives regarding  colonoscopy have been reviewed with the patient.  Questions have been answered.  All parties agreeable.   Scott MoodKiran Tadeusz Stahl, MD  04/09/2017, 8:51 AM

## 2017-04-09 NOTE — Anesthesia Postprocedure Evaluation (Signed)
Anesthesia Post Note  Patient: Scott Hale  Procedure(s) Performed: COLONOSCOPY WITH PROPOFOL (N/A )  Patient location during evaluation: Endoscopy Anesthesia Type: General Level of consciousness: awake and alert and oriented Pain management: pain level controlled Vital Signs Assessment: post-procedure vital signs reviewed and stable Respiratory status: spontaneous breathing, nonlabored ventilation and respiratory function stable Cardiovascular status: blood pressure returned to baseline and stable Postop Assessment: no signs of nausea or vomiting Anesthetic complications: no     Last Vitals:  Vitals:   04/09/17 0949 04/09/17 0959  BP: 120/85 (!) 126/97  Pulse: (!) 55 (!) 56  Resp: (!) 6 17  Temp:    SpO2: 99% 100%    Last Pain:  Vitals:   04/09/17 0929  TempSrc: Tympanic  PainSc:                  Tajon Moring

## 2017-04-09 NOTE — Anesthesia Post-op Follow-up Note (Signed)
Anesthesia QCDR form completed.        

## 2017-04-09 NOTE — Transfer of Care (Signed)
Immediate Anesthesia Transfer of Care Note  Patient: Scott Hale  Procedure(s) Performed: COLONOSCOPY WITH PROPOFOL (N/A )  Patient Location: Endoscopy Unit  Anesthesia Type:General  Level of Consciousness: awake  Airway & Oxygen Therapy: Patient Spontanous Breathing  Post-op Assessment: Report given to RN  Post vital signs: stable  Last Vitals:  Vitals:   04/09/17 0824 04/09/17 0929  BP: (!) 162/95 135/76  Pulse: 66 (!) 58  Resp: 18 10  Temp: (!) 36.1 C (!) 36.4 C  SpO2: 100% 98%    Last Pain:  Vitals:   04/09/17 0929  TempSrc: Tympanic  PainSc:          Complications: No apparent anesthesia complications

## 2017-04-09 NOTE — Anesthesia Preprocedure Evaluation (Signed)
Anesthesia Evaluation  Patient identified by MRN, date of birth, ID band Patient awake    Reviewed: Allergy & Precautions, NPO status , Patient's Chart, lab work & pertinent test results  History of Anesthesia Complications Negative for: history of anesthetic complications  Airway Mallampati: III  TM Distance: >3 FB Neck ROM: Full    Dental  (+) Implants   Pulmonary neg pulmonary ROS, neg sleep apnea, neg COPD,    breath sounds clear to auscultation- rhonchi (-) wheezing      Cardiovascular hypertension, Pt. on medications + CAD, + CABG and +CHF  (-) Cardiac Stents  Rhythm:Regular Rate:Normal - Systolic murmurs and - Diastolic murmurs    Neuro/Psych negative neurological ROS  negative psych ROS   GI/Hepatic negative GI ROS, Neg liver ROS,   Endo/Other  diabetes, Insulin Dependent  Renal/GU negative Renal ROS     Musculoskeletal negative musculoskeletal ROS (+)   Abdominal (+) + obese,   Peds  Hematology negative hematology ROS (+)   Anesthesia Other Findings Past Medical History: No date: CHF (congestive heart failure) (HCC) No date: Diabetes mellitus without complication (HCC) No date: Erectile dysfunction No date: Hypertension   Reproductive/Obstetrics                             Anesthesia Physical Anesthesia Plan  ASA: III  Anesthesia Plan: General   Post-op Pain Management:    Induction: Intravenous  PONV Risk Score and Plan: 1 and Propofol infusion  Airway Management Planned: Natural Airway  Additional Equipment:   Intra-op Plan:   Post-operative Plan:   Informed Consent: I have reviewed the patients History and Physical, chart, labs and discussed the procedure including the risks, benefits and alternatives for the proposed anesthesia with the patient or authorized representative who has indicated his/her understanding and acceptance.   Dental advisory  given  Plan Discussed with: CRNA and Anesthesiologist  Anesthesia Plan Comments:         Anesthesia Quick Evaluation

## 2017-04-09 NOTE — Op Note (Signed)
[facility] Gastroenterology Patient Name: Scott Hale Procedure Date: 04/09/2017 9:16 AM MRN: [id number] Account #: [id number] Date of Birth: 06/29/1950 Admit Type: Outpatient Age: 67 Room: [facility] ENDO ROOM 1 Gender: Male Note Status: Finalized Procedure:            Colonoscopy Indications:          Screening for colorectal malignant neoplasm Providers:            Wyline Mood MD, MD Referring MD:         Rhona Leavens. Burnett Sheng, MD (Referring MD) Medicines:            Monitored Anesthesia Care Complications:        No immediate complications. Procedure:            Pre-Anesthesia Assessment:                       - Prior to the procedure, a History and Physical was                        performed, and patient medications, allergies and                        sensitivities were reviewed. The patient's tolerance of                        previous anesthesia was reviewed.                       - The risks and benefits of the procedure and the                        sedation options and risks were discussed with the                        patient. All questions were answered and informed                        consent was obtained.                       - ASA Grade Assessment: II - A patient with mild                        systemic disease.                       After obtaining informed consent, the colonoscope was                        passed under direct vision. Throughout the procedure,                        the patient's blood pressure, pulse, and oxygen                        saturations were monitored continuously. The                        Colonoscope was introduced through the anus with the  intention of advancing to the cecum. The scope was                        advanced to the sigmoid colon before the procedure was                        aborted. Medications were given. The colonoscopy was                        performed with ease. The  patient tolerated the                        procedure well. The quality of the bowel preparation                        was inadequate. Findings:      A large amount of semi-solid stool was found in the sigmoid colon,       interfering with visualization. Impression:           - Preparation of the colon was inadequate.                       - Stool in the sigmoid colon.                       - No specimens collected. Recommendation:       - Discharge patient to home (with escort).                       - Resume previous diet.                       - Continue present medications.                       - Repeat colonoscopy in 2 weeks because the bowel                        preparation was suboptimal. Procedure Code(s):    --- Professional ---                       [id number], 53, Colonoscopy, flexible; diagnostic, including                        collection of specimen(s) by brushing or washing, when                        performed (separate procedure) Diagnosis Code(s):    --- Professional ---                       Z12.11, Encounter for screening for malignant neoplasm                        of colon CPT copyright 2016 American Medical Association. All rights reserved. The codes documented in this report are preliminary and upon coder review may  be revised to meet current compliance requirements. Wyline MoodKiran Leonidas Boateng, MD Wyline MoodKiran Stephaniemarie Stoffel MD, MD 04/09/2017 9:25:44 AM This report has been signed electronically. Number of Addenda: 0 Note Initiated On: 04/09/2017 9:16 AM Total Procedure Duration: 0 hours 2 minutes 0 seconds  [facility]

## 2017-04-10 ENCOUNTER — Encounter: Payer: Self-pay | Admitting: Gastroenterology

## 2017-04-10 DIAGNOSIS — R0602 Shortness of breath: Secondary | ICD-10-CM | POA: Diagnosis not present

## 2017-04-10 DIAGNOSIS — R079 Chest pain, unspecified: Secondary | ICD-10-CM | POA: Diagnosis not present

## 2017-04-16 ENCOUNTER — Telehealth: Payer: Self-pay

## 2017-04-16 NOTE — Telephone Encounter (Signed)
LVM for patient requesting callback to reschedule colonoscopy.   Patient will need to schedule a colonoscopy with 2 day prep.  Mailed letter also.

## 2017-04-22 NOTE — Progress Notes (Deleted)
Patient ID: Scott Hale, male    DOB: 04-13-1950, 67 y.o.   MRN: [id number]  HPI  Scott Hale is a 67 y/o male with a history of diabetes, HTN and chronic heart failure.   There is no echo report.  Was in the ED 03/24/17 due to shortness of breath and a rash. BNP was elevated. He was treated and released.   He presents today for a follow-up visit with a chief complaint of    Past Medical History:  Diagnosis Date  . CHF (congestive heart failure) (HCC)   . Diabetes mellitus without complication (HCC)   . Erectile dysfunction   . Hypertension    Past Surgical History:  Procedure Laterality Date  . COLONOSCOPY WITH PROPOFOL N/A 04/09/2017   Procedure: COLONOSCOPY WITH PROPOFOL;  Surgeon: Wyline MoodAnna, Kiran, MD;  Location: [facility] ENDOSCOPY;  Service: Gastroenterology;  Laterality: N/A;  . CORONARY ARTERY BYPASS GRAFT  2003  . JOINT REPLACEMENT     Family History  Problem Relation Age of Onset  . Diabetes Mother   . Alcoholism Father   . Diabetes Sister   . Diabetes Sister   . Arrhythmia Sister   . Diabetes Sister    Social History   Tobacco Use  . Smoking status: Never Smoker  . Smokeless tobacco: Never Used  Substance Use Topics  . Alcohol use: No   No Known Allergies   Review of Systems  Constitutional: Positive for fatigue. Negative for appetite change.  HENT: Negative for congestion, postnasal drip and sore throat.   Eyes: Negative.   Respiratory: Positive for shortness of breath. Negative for cough and chest tightness.   Cardiovascular: Positive for chest pain (when lifting heavy weights). Negative for leg swelling.  Gastrointestinal: Negative for abdominal distention and abdominal pain.  Endocrine: Negative.   Genitourinary: Negative.   Musculoskeletal: Negative for back pain and neck pain.  Skin: Negative.   Allergic/Immunologic: Negative.   Neurological: Positive for light-headedness (sometimes). Negative for dizziness.  Hematological: Negative for adenopathy. Does  not bruise/bleed easily.  Psychiatric/Behavioral: Positive for sleep disturbance. Negative for dysphoric mood. The patient is not nervous/anxious.        Physical Exam  Constitutional: He is oriented to person, place, and time. He appears well-developed and well-nourished.  HENT:  Head: Normocephalic and atraumatic.  Neck: Normal range of motion. Neck supple. No JVD present.  Cardiovascular: Normal rate and regular rhythm.  Pulmonary/Chest: Effort normal. He has no wheezes. He has no rales.  Abdominal: Soft. He exhibits no distension. There is no tenderness.  Musculoskeletal: He exhibits no edema or tenderness.  Neurological: He is alert and oriented to person, place, and time.  Skin: Skin is warm and dry.  Psychiatric: He has a normal mood and affect. His behavior is normal. Thought content normal.  Nursing note and vitals reviewed.  Assessment & Plan:  1: Chronic heart failure with unknown ejection fraction- - NYHA class II - euvolemic today - weighing daily and he was reminded to call for an overnight weight gain of >2 pounds or a weekly weight gain of >5 pounds - not adding salt to his food. Reviewed the importance of closely following a 2000mg  sodium diet and written dietary information was given to him about this. Reviewed how to read food labels. Did admit to eating onion rings and hot dogs last night. - BNP 03/24/17 was 185.0 - is quite active and goes to the gym for about an hour every day - echo scheduled - ? cardiology (  [facility]) follow-up scheduled on 04/10/17  2: HTN- - BP mildly elevated but he admits that he was a little anxious about coming today - continue to monitor - saw PCP Burnett Sheng) 03/27/17 - BMP on 03/24/17 reviewed and showed sodium 138, potassium 4.0 and GFR >60  3: Diabetes-  - nonfasting glucose at home was  - A1c on 11/05/16 was 7.1% - drank sweet tea last night  4: Snoring- - agreeable to a sleep study as this could be contributing to his  HTN  Medications were reviewed.

## 2017-04-23 ENCOUNTER — Ambulatory Visit: Payer: Medicare HMO | Admitting: Family

## 2017-04-25 DIAGNOSIS — R079 Chest pain, unspecified: Secondary | ICD-10-CM | POA: Diagnosis not present

## 2017-04-29 DIAGNOSIS — R079 Chest pain, unspecified: Secondary | ICD-10-CM | POA: Diagnosis not present

## 2017-04-29 DIAGNOSIS — R0602 Shortness of breath: Secondary | ICD-10-CM | POA: Diagnosis not present

## 2017-05-03 NOTE — Progress Notes (Signed)
Patient ID: Scott Hale, male    DOB: 1950/03/22, 67 y.o.   MRN: [id number]  HPI  Mr Buras is a 67 y/o male with a history of diabetes, HTN and chronic heart failure.   Echo report from 04/29/17 reviewed and showed an EF of 35% along with mild MR/TR/AR. Stress test performed 04/29/17 and showed an EF of 31% along with inferolateral T wave inversions with stress.  Was in the ED 03/24/17 due to shortness of breath and a rash. BNP was elevated. He was treated and released.   He presents today for a follow-up visit with a chief complaint of minimal shortness of breath upon moderate exertion. He describes this as chronic in nature having been present for a few years. He has associated fatigue, intermittent chest pain and gradual weight gain along with this. He denies any difficulty sleeping, abdominal distention, palpitations, edema, cough, or dizziness. Has been started on carvedilol since he was last here. Continues to lift weights as he's done since he was ~ 67 years of age.   Past Medical History:  Diagnosis Date  . CHF (congestive heart failure) (HCC)   . Diabetes mellitus without complication (HCC)   . Erectile dysfunction   . Hypertension    Past Surgical History:  Procedure Laterality Date  . COLONOSCOPY WITH PROPOFOL N/A 04/09/2017   Procedure: COLONOSCOPY WITH PROPOFOL;  Surgeon: Wyline Mood, MD;  Location: [facility] ENDOSCOPY;  Service: Gastroenterology;  Laterality: N/A;  . CORONARY ARTERY BYPASS GRAFT  2003  . JOINT REPLACEMENT     Family History  Problem Relation Age of Onset  . Diabetes Mother   . Alcoholism Father   . Diabetes Sister   . Diabetes Sister   . Arrhythmia Sister   . Diabetes Sister    Social History   Tobacco Use  . Smoking status: Never Smoker  . Smokeless tobacco: Never Used  Substance Use Topics  . Alcohol use: No   No Known Allergies  Prior to Admission medications   Medication Sig Start Date End Date Taking? Authorizing Provider  ASPIRIN 81 PO Take  by mouth.   Yes [provider]  carvedilol (COREG) 6.25 MG tablet Take 6.25 mg by mouth 2 (two) times daily with a meal.   Yes [provider]  glipiZIDE (GLUCOTROL) 10 MG tablet TAKE 1 TABLET EVERY MORNING BEFORE BREAKFAST 08/07/16  Yes [provider]  insulin detemir (LEVEMIR) 100 UNIT/ML injection Inject 80 Units into the skin at bedtime.  08/20/16  Yes [provider]  lisinopril (PRINIVIL,ZESTRIL) 20 MG tablet Take by mouth. 03/19/16 05/07/17 Yes [provider]    Review of Systems  Constitutional: Positive for fatigue. Negative for appetite change.  HENT: Negative for congestion, postnasal drip and sore throat.   Eyes: Negative.   Respiratory: Positive for shortness of breath. Negative for cough and chest tightness.   Cardiovascular: Positive for chest pain (intermittently). Negative for palpitations and leg swelling.  Gastrointestinal: Negative for abdominal distention and abdominal pain.  Endocrine: Negative.   Genitourinary: Negative.   Musculoskeletal: Negative for back pain and neck pain.  Skin: Negative.   Allergic/Immunologic: Negative.   Neurological: Negative for dizziness and light-headedness.  Hematological: Negative for adenopathy. Does not bruise/bleed easily.  Psychiatric/Behavioral: Negative for dysphoric mood and sleep disturbance. The patient is not nervous/anxious.    Vitals:   05/07/17 1045  BP: 140/73  Pulse: (!) 55  Resp: 18  SpO2: 100%  Weight: 245 lb 8 oz (111.4 kg)  Height:  6' (1.829 m)   Wt Readings from Last 3 Encounters:  05/07/17 245 lb 8 oz (111.4 kg)  03/26/17 238 lb 8 oz (108.2 kg)  03/24/17 244 lb (110.7 kg)   Lab Results  Component Value Date   CREATININE 1.20 03/24/2017   Physical Exam  Constitutional: He is oriented to person, place, and time. He appears well-developed and well-nourished.  HENT:  Head: Normocephalic and atraumatic.  Neck: Normal range of motion. Neck supple. No JVD present.   Cardiovascular: Regular rhythm. Bradycardia present.  Pulmonary/Chest: Effort normal. He has no wheezes. He has no rales.  Abdominal: Soft. He exhibits no distension. There is no tenderness.  Musculoskeletal: He exhibits no edema or tenderness.  Neurological: He is alert and oriented to person, place, and time.  Skin: Skin is warm and dry.  Psychiatric: He has a normal mood and affect. His behavior is normal. Thought content normal.  Nursing note and vitals reviewed.  Assessment & Plan:  1: Chronic heart failure with reduced ejection fraction- - NYHA class II - euvolemic today - weighing daily and he was reminded to call for an overnight weight gain of >2 pounds or a weekly weight gain of >5 pounds - weight up 7 pounds since 03/26/17 - not adding salt to his food. Reviewed the importance of closely following a 2000mg  sodium diet  - BNP 03/24/17 was 185.0 - is quite active and goes to the gym for about an hour every day - saw cardiology ([facility]) 04/29/17 and returns in June - discussed changing his lisinopril to entresto at his next visit - may not be able to titrate up his carvedilol if he remains braydcardic - PharmD reconciled medications with the patient  2: HTN- - BP looks good today - continue to monitor - saw PCP Burnett Sheng([place]) 03/27/17 - BMP on 03/24/17 reviewed and showed sodium 138, potassium 4.0 and GFR >60  3: Diabetes-  - nonfasting glucose at home was 124 - A1c on 11/05/16 was 7.1%  Medications were reviewed.  Return in 1 month or sooner for any questions/problems before then.

## 2017-05-07 ENCOUNTER — Ambulatory Visit: Payer: Medicare HMO | Attending: Family | Admitting: Family

## 2017-05-07 ENCOUNTER — Encounter: Payer: Self-pay | Admitting: Family

## 2017-05-07 VITALS — BP 140/73 | HR 55 | Resp 18 | Ht 72.0 in | Wt 245.5 lb

## 2017-05-07 DIAGNOSIS — I10 Essential (primary) hypertension: Secondary | ICD-10-CM

## 2017-05-07 DIAGNOSIS — Z951 Presence of aortocoronary bypass graft: Secondary | ICD-10-CM | POA: Insufficient documentation

## 2017-05-07 DIAGNOSIS — N529 Male erectile dysfunction, unspecified: Secondary | ICD-10-CM | POA: Insufficient documentation

## 2017-05-07 DIAGNOSIS — I509 Heart failure, unspecified: Secondary | ICD-10-CM | POA: Diagnosis not present

## 2017-05-07 DIAGNOSIS — E119 Type 2 diabetes mellitus without complications: Secondary | ICD-10-CM | POA: Diagnosis not present

## 2017-05-07 DIAGNOSIS — Z794 Long term (current) use of insulin: Secondary | ICD-10-CM | POA: Diagnosis not present

## 2017-05-07 DIAGNOSIS — Z79899 Other long term (current) drug therapy: Secondary | ICD-10-CM | POA: Insufficient documentation

## 2017-05-07 DIAGNOSIS — Z7982 Long term (current) use of aspirin: Secondary | ICD-10-CM | POA: Insufficient documentation

## 2017-05-07 DIAGNOSIS — I5022 Chronic systolic (congestive) heart failure: Secondary | ICD-10-CM

## 2017-05-07 DIAGNOSIS — I110 Hypertensive heart disease with heart failure: Secondary | ICD-10-CM | POA: Insufficient documentation

## 2017-05-07 NOTE — Patient Instructions (Signed)
Continue weighing daily and call for an overnight weight gain of > 2 pounds or a weekly weight gain of >5 pounds. 

## 2017-06-08 NOTE — Progress Notes (Signed)
Patient ID: Scott Hale, male    DOB: 13-May-1950, 67 y.o.   MRN: [id number]  HPI  Scott Hale is a 67 y/o male with a history of diabetes, HTN and chronic heart failure.   Echo report from 04/29/17 reviewed and showed an EF of 35% along with mild Scott/TR/AR. Stress test performed 04/29/17 and showed an EF of 31% along with inferolateral T wave inversions with stress.  Was in the ED 03/24/17 due to shortness of breath and a rash. BNP was elevated. He was treated and released.   He presents today for a follow-up visit with a chief complaint of minimal shortness of breath upon moderate exertion. He says this has been present for several months although has been getting better. He has associated fatigue along with this. He denies any difficulty sleeping, abdominal distention, palpitations, edema, chest pain, cough or dizziness.   Past Medical History:  Diagnosis Date  . CHF (congestive heart failure) (HCC)   . Diabetes mellitus without complication (HCC)   . Erectile dysfunction   . Hypertension    Past Surgical History:  Procedure Laterality Date  . COLONOSCOPY WITH PROPOFOL N/A 04/09/2017   Procedure: COLONOSCOPY WITH PROPOFOL;  Surgeon: Wyline Mood, MD;  Location: [facility] ENDOSCOPY;  Service: Gastroenterology;  Laterality: N/A;  . CORONARY ARTERY BYPASS GRAFT  2003  . JOINT REPLACEMENT     Family History  Problem Relation Age of Onset  . Diabetes Mother   . Alcoholism Father   . Diabetes Sister   . Diabetes Sister   . Arrhythmia Sister   . Diabetes Sister    Social History   Tobacco Use  . Smoking status: Never Smoker  . Smokeless tobacco: Never Used  Substance Use Topics  . Alcohol use: No   No Known Allergies   Prior to Admission medications   Medication Sig Start Date End Date Taking? Authorizing Provider  ASPIRIN 81 PO Take by mouth daily.    Yes [provider]  carvedilol (COREG) 6.25 MG tablet Take 6.25 mg by mouth 2 (two) times daily with a meal.   Yes [provider]  glipiZIDE (GLUCOTROL) 10 MG tablet TAKE 1 TABLET EVERY MORNING BEFORE BREAKFAST 08/07/16  Yes [provider]  insulin detemir (LEVEMIR) 100 UNIT/ML injection Inject 80 Units into the skin at bedtime.  08/20/16  Yes [provider]  lisinopril (PRINIVIL,ZESTRIL) 20 MG tablet Take by mouth daily.  03/19/16 06/10/17 Yes [provider]    Review of Systems  Constitutional: Positive for fatigue (improving). Negative for appetite change.  HENT: Negative for congestion, postnasal drip and sore throat.   Eyes: Negative.   Respiratory: Positive for shortness of breath (improving). Negative for cough and chest tightness.   Cardiovascular: Negative for chest pain, palpitations and leg swelling.  Gastrointestinal: Negative for abdominal distention and abdominal pain.  Endocrine: Negative.   Genitourinary: Negative.   Musculoskeletal: Negative for back pain and neck pain.  Skin: Negative.   Allergic/Immunologic: Negative.   Neurological: Negative for dizziness and light-headedness.  Hematological: Negative for adenopathy. Does not bruise/bleed easily.  Psychiatric/Behavioral: Negative for dysphoric mood and sleep disturbance. The patient is not nervous/anxious.    Vitals:   06/10/17 1041  BP: (!) 145/81  Pulse: (!) 57  Resp: 18  SpO2: 100%  Weight: 244 lb 2 oz (110.7 kg)  Height: 6' (1.829 m)   Wt Readings from Last 3 Encounters:  06/10/17 244 lb 2 oz (110.7 kg)  05/07/17 245 lb 8 oz (  111.4 kg)  03/26/17 238 lb 8 oz (108.2 kg)   Lab Results  Component Value Date   CREATININE 1.20 03/24/2017    Physical Exam  Constitutional: He is oriented to person, place, and time. He appears well-developed and well-nourished.  HENT:  Head: Normocephalic and atraumatic.  Neck: Normal range of motion. Neck supple. No JVD present.  Cardiovascular: Regular rhythm. Bradycardia present.  Pulmonary/Chest: Effort normal. He has no wheezes. He has no rales.   Abdominal: Soft. He exhibits no distension. There is no tenderness.  Musculoskeletal: He exhibits no edema or tenderness.  Neurological: He is alert and oriented to person, place, and time.  Skin: Skin is warm and dry.  Psychiatric: He has a normal mood and affect. His behavior is normal. Thought content normal.  Nursing note and vitals reviewed.  Assessment & Plan:  1: Chronic heart failure with reduced ejection fraction- - NYHA class II - euvolemic today - weighing daily and he was reminded to call for an overnight weight gain of >2 pounds or a weekly weight gain of >5 pounds - weight stable - not adding salt to his food. Reviewed the importance of closely following a  sodium diet  - BNP 03/24/17 was 185.0 - is quite active and goes to the gym for about 1-2 hours every day - saw cardiology ([facility]) 04/29/17 and returns in June - will stop lisinopril and begin entresto 24/26mg  twice daily. 30 day coupon given for patient to get the first month of entresto free. Patient took lisinopril already today so advised him to not take it tomorrow and begin taking entresto twice daily on Wednesday May 1st.  - will check a BMP at his next visit - unable to titrate carvedilol due to bradycardia - PharmD reconciled medications with the patient  2: HTN- - BP looks good today - continue to monitor - saw PCP Burnett Sheng) 03/27/17 - BMP on 03/24/17 reviewed and showed sodium 138, potassium 4.0 and GFR >60  3: Diabetes-  - nonfasting glucose at home was 150 - A1c on 11/05/16 was 7.1%  Patient did not bring his medications nor a list. Each medication was verbally reviewed with the patient and he was encouraged to bring the bottles to every visit to confirm accuracy of list.   Return in 1 month or sooner for any questions/problems before then.

## 2017-06-10 ENCOUNTER — Encounter: Payer: Self-pay | Admitting: Family

## 2017-06-10 ENCOUNTER — Ambulatory Visit: Payer: Medicare HMO | Attending: Family | Admitting: Family

## 2017-06-10 VITALS — BP 145/81 | HR 57 | Resp 18 | Ht 72.0 in | Wt 244.1 lb

## 2017-06-10 DIAGNOSIS — E119 Type 2 diabetes mellitus without complications: Secondary | ICD-10-CM | POA: Diagnosis not present

## 2017-06-10 DIAGNOSIS — Z8249 Family history of ischemic heart disease and other diseases of the circulatory system: Secondary | ICD-10-CM | POA: Diagnosis not present

## 2017-06-10 DIAGNOSIS — I110 Hypertensive heart disease with heart failure: Secondary | ICD-10-CM | POA: Insufficient documentation

## 2017-06-10 DIAGNOSIS — Z79899 Other long term (current) drug therapy: Secondary | ICD-10-CM | POA: Insufficient documentation

## 2017-06-10 DIAGNOSIS — Z7982 Long term (current) use of aspirin: Secondary | ICD-10-CM | POA: Diagnosis not present

## 2017-06-10 DIAGNOSIS — Z794 Long term (current) use of insulin: Secondary | ICD-10-CM | POA: Diagnosis not present

## 2017-06-10 DIAGNOSIS — R001 Bradycardia, unspecified: Secondary | ICD-10-CM | POA: Diagnosis not present

## 2017-06-10 DIAGNOSIS — Z951 Presence of aortocoronary bypass graft: Secondary | ICD-10-CM | POA: Diagnosis not present

## 2017-06-10 DIAGNOSIS — Z833 Family history of diabetes mellitus: Secondary | ICD-10-CM | POA: Insufficient documentation

## 2017-06-10 DIAGNOSIS — R0602 Shortness of breath: Secondary | ICD-10-CM | POA: Diagnosis not present

## 2017-06-10 DIAGNOSIS — Z811 Family history of alcohol abuse and dependence: Secondary | ICD-10-CM | POA: Insufficient documentation

## 2017-06-10 DIAGNOSIS — Z9889 Other specified postprocedural states: Secondary | ICD-10-CM | POA: Diagnosis not present

## 2017-06-10 DIAGNOSIS — I5022 Chronic systolic (congestive) heart failure: Secondary | ICD-10-CM

## 2017-06-10 DIAGNOSIS — I10 Essential (primary) hypertension: Secondary | ICD-10-CM

## 2017-06-10 MED ORDER — SACUBITRIL-VALSARTAN 24-26 MG PO TABS: 1.0000 | ORAL_TABLET | Freq: Two times a day (BID) | ORAL | 3 refills | Status: DC

## 2017-06-10 NOTE — Patient Instructions (Addendum)
Continue weighing daily and call for an overnight weight gain of > 2 pounds or a weekly weight gain of >5 pounds.  Do not take anymore lisinopril. Begin entresto 24/26mg  twice daily beginning on Wednesday May 1st.

## 2017-07-08 NOTE — Progress Notes (Signed)
Patient ID: Scott Hale, male    DOB: [birth year], 67 y.o.   MRN: [id number]  HPI  Mr Consuegra is a 67 y/o male with a history of diabetes, HTN and chronic heart failure.   Echo report from 04/29/17 reviewed and showed an EF of 35% along with mild MR/TR/AR. Stress test performed 04/29/17 and showed an EF of 31% along with inferolateral T wave inversions with stress.  Was in the ED 03/24/17 due to shortness of breath and a rash. BNP was elevated. He was treated and released.   He presents today for a follow-up visit with a chief complaint of minimal shortness of breath upon moderate exertion. He has associated fatigue along with this. He denies any cough, chest pain, edema, palpitations, abdominal distention, dizziness, difficulty sleeping or weight gain. Has tolerated the initiation of entresto without known side effects.   Past Medical History:  Diagnosis Date  . CHF (congestive heart failure) (HCC)   . Diabetes mellitus without complication (HCC)   . Erectile dysfunction   . Hypertension    Past Surgical History:  Procedure Laterality Date  . COLONOSCOPY WITH PROPOFOL N/A 04/09/2017   Procedure: COLONOSCOPY WITH PROPOFOL;  Surgeon: Wyline Mood, MD;  Location: [facility] ENDOSCOPY;  Service: Gastroenterology;  Laterality: N/A;  . CORONARY ARTERY BYPASS GRAFT  2003  . JOINT REPLACEMENT     Family History  Problem Relation Age of Onset  . Diabetes Mother   . Alcoholism Father   . Diabetes Sister   . Diabetes Sister   . Arrhythmia Sister   . Diabetes Sister    Social History   Tobacco Use  . Smoking status: Never Smoker  . Smokeless tobacco: Never Used  Substance Use Topics  . Alcohol use: No   No Known Allergies   Prior to Admission medications   Medication Sig Start Date End Date Taking? Authorizing Provider  ASPIRIN 81 PO Take by mouth daily.    Yes [provider]  carvedilol (COREG) 6.25 MG tablet Take 6.25 mg by mouth 2 (two) times daily with a meal.   Yes [provider]  glipiZIDE (GLUCOTROL) 10 MG tablet TAKE 1 TABLET EVERY MORNING BEFORE BREAKFAST 08/07/16  Yes [provider]  insulin detemir (LEVEMIR) 100 UNIT/ML injection Inject 80 Units into the skin at bedtime.  08/20/16  Yes [provider]  sacubitril-valsartan (ENTRESTO) 24-26 MG Take 1 tablet by mouth 2 (two) times daily. 06/10/17  Yes Delma Freeze, FNP    Review of Systems  Constitutional: Positive for fatigue (improving). Negative for appetite change.  HENT: Negative for congestion, postnasal drip and sore throat.   Eyes: Negative.   Respiratory: Positive for shortness of breath (improving). Negative for cough and chest tightness.   Cardiovascular: Negative for chest pain, palpitations and leg swelling.  Gastrointestinal: Negative for abdominal distention and abdominal pain.  Endocrine: Negative.   Genitourinary: Negative.   Musculoskeletal: Negative for back pain and neck pain.  Skin: Negative.   Allergic/Immunologic: Negative.   Neurological: Negative for dizziness and light-headedness.  Hematological: Negative for adenopathy. Does not bruise/bleed easily.  Psychiatric/Behavioral: Negative for dysphoric mood and sleep disturbance. The patient is not nervous/anxious.    Vitals:   07/10/17 1036  BP: 137/82  Pulse: (!) 59  Resp: 18  SpO2: 100%  Weight: 242 lb 6 oz (109.9 kg)  Height: 6' (1.829 m)   Wt Readings from Last 3 Encounters:  07/10/17 242 lb 6 oz (109.9 kg)  06/10/17 244 lb 2  oz (110.7 kg)  05/07/17 245 lb 8 oz (111.4 kg)   Lab Results  Component Value Date   CREATININE 1.20 03/24/2017   Physical Exam  Constitutional: He is oriented to person, place, and time. He appears well-developed and well-nourished.  HENT:  Head: Normocephalic and atraumatic.  Neck: Normal range of motion. Neck supple. No JVD present.  Cardiovascular: Regular rhythm. Bradycardia present.  Pulmonary/Chest: Effort normal. He has no wheezes. He has no rales.   Abdominal: Soft. He exhibits no distension. There is no tenderness.  Musculoskeletal: He exhibits no edema or tenderness.  Neurological: He is alert and oriented to person, place, and time.  Skin: Skin is warm and dry.  Psychiatric: He has a normal mood and affect. His behavior is normal. Thought content normal.  Nursing note and vitals reviewed.  Assessment & Plan:  1: Chronic heart failure with reduced ejection fraction- - NYHA class II - euvolemic today - weighing daily and he was reminded to call for an overnight weight gain of >2 pounds or a weekly weight gain of >5 pounds - weight down 2 pounds since he was last here - not adding salt to his food. Reviewed the importance of closely following a  sodium diet  - BNP 03/24/17 was 185.0 - is quite active and goes to the gym for about 1-2 hours every day - saw cardiology ([facility]) 04/29/17 and returns in June - started entresto 24/26mg  at his last visit; will increase his entresto to 49/51mg  BID today - will check a BMP today - unable to titrate carvedilol due to bradycardia - PharmD reconciled medications with the patient  2: HTN- - BP looks good today - continue to monitor - saw PCP Burnett Sheng) 03/27/17 - BMP on 03/24/17 reviewed and showed sodium 138, potassium 4.0 and GFR >60  3: Diabetes-  - fasting glucose at home was 160; ate chicken and dumplings and sweet tea last night - A1c on 11/05/16 was 7.1%  Patient did not bring his medications nor a list. Each medication was verbally reviewed with the patient and he was encouraged to bring the bottles to every visit to confirm accuracy of list.   Return in 2 months or sooner for any questions/problems before then.

## 2017-07-10 ENCOUNTER — Encounter: Payer: Self-pay | Admitting: Family

## 2017-07-10 ENCOUNTER — Ambulatory Visit: Payer: Medicare HMO | Attending: Family | Admitting: Family

## 2017-07-10 ENCOUNTER — Other Ambulatory Visit: Payer: Self-pay | Admitting: Family

## 2017-07-10 VITALS — BP 137/82 | HR 59 | Resp 18 | Ht 72.0 in | Wt 242.4 lb

## 2017-07-10 DIAGNOSIS — Z951 Presence of aortocoronary bypass graft: Secondary | ICD-10-CM | POA: Insufficient documentation

## 2017-07-10 DIAGNOSIS — E119 Type 2 diabetes mellitus without complications: Secondary | ICD-10-CM | POA: Insufficient documentation

## 2017-07-10 DIAGNOSIS — I10 Essential (primary) hypertension: Secondary | ICD-10-CM

## 2017-07-10 DIAGNOSIS — I110 Hypertensive heart disease with heart failure: Secondary | ICD-10-CM | POA: Insufficient documentation

## 2017-07-10 DIAGNOSIS — I5022 Chronic systolic (congestive) heart failure: Secondary | ICD-10-CM | POA: Insufficient documentation

## 2017-07-10 DIAGNOSIS — Z79899 Other long term (current) drug therapy: Secondary | ICD-10-CM | POA: Insufficient documentation

## 2017-07-10 DIAGNOSIS — Z833 Family history of diabetes mellitus: Secondary | ICD-10-CM | POA: Insufficient documentation

## 2017-07-10 DIAGNOSIS — Z794 Long term (current) use of insulin: Secondary | ICD-10-CM | POA: Insufficient documentation

## 2017-07-10 DIAGNOSIS — Z7982 Long term (current) use of aspirin: Secondary | ICD-10-CM | POA: Diagnosis not present

## 2017-07-10 LAB — BASIC METABOLIC PANEL
Anion gap: 6 (ref 5–15)
BUN: 15 mg/dL (ref 6–20)
CHLORIDE: 101 mmol/L (ref 101–111)
CO2: 26 mmol/L (ref 22–32)
Calcium: 9.1 mg/dL (ref 8.9–10.3)
Creatinine, Ser: 1.21 mg/dL (ref 0.61–1.24)
GFR calc Af Amer: >60 mL/min (ref >60)
GLUCOSE: 236 mg/dL — AB (ref 65–99)
Potassium: 4.7 mmol/L (ref 3.5–5.1)
SODIUM: 133 mmol/L — AB (ref 135–145)

## 2017-07-10 MED ORDER — SACUBITRIL-VALSARTAN 49-51 MG PO TABS: 1.0000 | ORAL_TABLET | Freq: Two times a day (BID) | ORAL | 3 refills | Status: DC

## 2017-07-10 MED ORDER — SACUBITRIL-VALSARTAN 49-51 MG PO TABS: 1.0000 | ORAL_TABLET | Freq: Two times a day (BID) | ORAL | 5 refills | Status: DC

## 2017-07-10 NOTE — Patient Instructions (Addendum)
Continue weighing daily and call for an overnight weight gain of > 2 pounds or a weekly weight gain of >5 pounds.  Pick up entresto 49/51mg  dose at the pharmacy

## 2017-07-25 DIAGNOSIS — R0602 Shortness of breath: Secondary | ICD-10-CM | POA: Diagnosis not present

## 2017-07-25 DIAGNOSIS — R079 Chest pain, unspecified: Secondary | ICD-10-CM | POA: Diagnosis not present

## 2017-08-09 DIAGNOSIS — R7309 Other abnormal glucose: Secondary | ICD-10-CM | POA: Diagnosis not present

## 2017-08-09 DIAGNOSIS — Z1159 Encounter for screening for other viral diseases: Secondary | ICD-10-CM | POA: Diagnosis not present

## 2017-08-09 DIAGNOSIS — I10 Essential (primary) hypertension: Secondary | ICD-10-CM | POA: Diagnosis not present

## 2017-08-09 DIAGNOSIS — Z1389 Encounter for screening for other disorder: Secondary | ICD-10-CM | POA: Diagnosis not present

## 2017-08-09 DIAGNOSIS — I2510 Atherosclerotic heart disease of native coronary artery without angina pectoris: Secondary | ICD-10-CM | POA: Diagnosis not present

## 2017-08-09 DIAGNOSIS — E785 Hyperlipidemia, unspecified: Secondary | ICD-10-CM | POA: Diagnosis not present

## 2017-08-09 DIAGNOSIS — E119 Type 2 diabetes mellitus without complications: Secondary | ICD-10-CM | POA: Diagnosis not present

## 2017-08-21 DIAGNOSIS — Z1211 Encounter for screening for malignant neoplasm of colon: Secondary | ICD-10-CM | POA: Diagnosis not present

## 2017-08-27 DIAGNOSIS — N182 Chronic kidney disease, stage 2 (mild): Secondary | ICD-10-CM | POA: Diagnosis not present

## 2017-08-27 DIAGNOSIS — N183 Chronic kidney disease, stage 3 (moderate): Secondary | ICD-10-CM | POA: Diagnosis not present

## 2017-09-09 ENCOUNTER — Ambulatory Visit: Payer: Medicare HMO | Attending: Family | Admitting: Family

## 2017-09-09 ENCOUNTER — Encounter: Payer: Self-pay | Admitting: Family

## 2017-09-09 VITALS — BP 141/80 | HR 63 | Resp 18 | Ht 72.0 in | Wt 246.1 lb

## 2017-09-09 DIAGNOSIS — E119 Type 2 diabetes mellitus without complications: Secondary | ICD-10-CM

## 2017-09-09 DIAGNOSIS — Z79899 Other long term (current) drug therapy: Secondary | ICD-10-CM | POA: Diagnosis not present

## 2017-09-09 DIAGNOSIS — I509 Heart failure, unspecified: Secondary | ICD-10-CM | POA: Diagnosis not present

## 2017-09-09 DIAGNOSIS — N529 Male erectile dysfunction, unspecified: Secondary | ICD-10-CM | POA: Insufficient documentation

## 2017-09-09 DIAGNOSIS — Z951 Presence of aortocoronary bypass graft: Secondary | ICD-10-CM | POA: Insufficient documentation

## 2017-09-09 DIAGNOSIS — I110 Hypertensive heart disease with heart failure: Secondary | ICD-10-CM | POA: Insufficient documentation

## 2017-09-09 DIAGNOSIS — I5022 Chronic systolic (congestive) heart failure: Secondary | ICD-10-CM

## 2017-09-09 DIAGNOSIS — R001 Bradycardia, unspecified: Secondary | ICD-10-CM | POA: Diagnosis not present

## 2017-09-09 DIAGNOSIS — I10 Essential (primary) hypertension: Secondary | ICD-10-CM

## 2017-09-09 DIAGNOSIS — Z7982 Long term (current) use of aspirin: Secondary | ICD-10-CM | POA: Insufficient documentation

## 2017-09-09 DIAGNOSIS — Z794 Long term (current) use of insulin: Secondary | ICD-10-CM | POA: Diagnosis not present

## 2017-09-09 LAB — BASIC METABOLIC PANEL
Anion gap: 9 (ref 5–15)
BUN: 10 mg/dL (ref 8–23)
CHLORIDE: 104 mmol/L (ref 98–111)
CO2: 26 mmol/L (ref 22–32)
Calcium: 9.5 mg/dL (ref 8.9–10.3)
Creatinine, Ser: 1.32 mg/dL — ABNORMAL HIGH (ref 0.61–1.24)
GFR calc Af Amer: >60 mL/min (ref >60)
GFR calc non Af Amer: 55 mL/min — ABNORMAL LOW (ref >60)
GLUCOSE: 151 mg/dL — AB (ref 70–99)
POTASSIUM: 4.6 mmol/L (ref 3.5–5.1)
Sodium: 139 mmol/L (ref 135–145)

## 2017-09-09 NOTE — Progress Notes (Signed)
Patient ID: Scott Hale, male    DOB: 10/02/1950, 67 y.o.   MRN: [id number]  HPI  Mr Bunte is a 67 y/o male with a history of diabetes, HTN and chronic heart failure.   Echo report from 04/29/17 reviewed and showed an EF of 35% along with mild MR/TR/AR. Stress test performed 04/29/17 and showed an EF of 31% along with inferolateral T wave inversions with stress.  Was in the ED 03/24/17 due to shortness of breath and a rash. BNP was elevated. He was treated and released.   He presents today for a follow-up visit with a chief complaint of minimal shortness of breath upon moderate exertion. He describes this as chronic in nature having been present for several months. He has associated fatigue and slight gradual weight gain along with this. He denies any difficulty sleeping, abdominal distention, palpitations, pedal edema, chest pain, cough or dizziness. Has not had entresto this morning as he ran out of the medication yesterday. Has a slight rash under his arms which he thinks may be related to new deodorant.   Past Medical History:  Diagnosis Date  . CHF (congestive heart failure) (HCC)   . Diabetes mellitus without complication (HCC)   . Erectile dysfunction   . Hypertension    Past Surgical History:  Procedure Laterality Date  . COLONOSCOPY WITH PROPOFOL N/A 04/09/2017   Procedure: COLONOSCOPY WITH PROPOFOL;  Surgeon: Wyline Mood, MD;  Location: [facility] ENDOSCOPY;  Service: Gastroenterology;  Laterality: N/A;  . CORONARY ARTERY BYPASS GRAFT  2003  . JOINT REPLACEMENT     Family History  Problem Relation Age of Onset  . Diabetes Mother   . Alcoholism Father   . Diabetes Sister   . Diabetes Sister   . Arrhythmia Sister   . Diabetes Sister    Social History   Tobacco Use  . Smoking status: Never Smoker  . Smokeless tobacco: Never Used  Substance Use Topics  . Alcohol use: No   No Known Allergies   Prior to Admission medications   Medication Sig Start Date End Date Taking?  Authorizing Provider  ASPIRIN 81 PO Take by mouth daily.    Yes [provider]  atorvastatin (LIPITOR) 40 MG tablet Take 40 mg by mouth daily. 08/07/16  Yes [provider]  carvedilol (COREG) 6.25 MG tablet Take 6.25 mg by mouth 2 (two) times daily with a meal.   Yes [provider]  glipiZIDE (GLUCOTROL) 10 MG tablet TAKE 1 TABLET EVERY MORNING BEFORE BREAKFAST 08/07/16  Yes [provider]  insulin detemir (LEVEMIR) 100 UNIT/ML injection Inject 80 Units into the skin at bedtime.  08/20/16  Yes [provider]  sacubitril-valsartan (ENTRESTO) 49-51 MG Take 1 tablet by mouth 2 (two) times daily. 07/10/17  Yes Delma Freeze, FNP    Review of Systems  Constitutional: Positive for fatigue (improving). Negative for appetite change.  HENT: Negative for congestion, postnasal drip and sore throat.   Eyes: Negative.   Respiratory: Positive for shortness of breath (minimal). Negative for cough and chest tightness.   Cardiovascular: Negative for chest pain, palpitations and leg swelling.  Gastrointestinal: Negative for abdominal distention and abdominal pain.  Endocrine: Negative.   Genitourinary: Negative.   Musculoskeletal: Negative for back pain and neck pain.  Skin: Positive for rash (times 2 weeks).  Allergic/Immunologic: Negative.   Neurological: Negative for dizziness and light-headedness.  Hematological: Negative for adenopathy. Does not bruise/bleed easily.  Psychiatric/Behavioral: Negative for dysphoric mood and sleep disturbance. The  patient is not nervous/anxious.    Vitals:   09/09/17 1252  BP: (!) 141/80  Pulse: 63  Resp: 18  SpO2: 99%  Weight: 246 lb 2 oz (111.6 kg)  Height: 6' (1.829 m)   Wt Readings from Last 3 Encounters:  09/09/17 246 lb 2 oz (111.6 kg)  07/10/17 242 lb 6 oz (109.9 kg)  06/10/17 244 lb 2 oz (110.7 kg)   Lab Results  Component Value Date   CREATININE 1.21 07/10/2017   CREATININE 1.20 03/24/2017     Physical Exam  Constitutional: He is oriented to person, place, and time. He appears well-developed and well-nourished.  HENT:  Head: Normocephalic and atraumatic.  Neck: Normal range of motion. Neck supple. No JVD present.  Cardiovascular: Regular rhythm. Bradycardia present.  Pulmonary/Chest: Effort normal. He has no wheezes. He has no rales.  Abdominal: Soft. He exhibits no distension. There is no tenderness.  Musculoskeletal: He exhibits no edema or tenderness.  Neurological: He is alert and oriented to person, place, and time.  Skin: Skin is warm and dry.  Psychiatric: He has a normal mood and affect. His behavior is normal. Thought content normal.  Nursing note and vitals reviewed.  Assessment & Plan:  1: Chronic heart failure with reduced ejection fraction- - NYHA class II - euvolemic today - weighing daily and he was reminded to call for an overnight weight gain of >2 pounds or a weekly weight gain of >5 pounds - weight up 4 pounds since he was last here 2 months ago - not adding salt to his food. Reviewed the importance of closely following a 2000mg  sodium diet  - BNP 03/24/17 was 185.0 - is quite active and goes to the gym for about 1-2 hours every day although has been taking a break this week - saw cardiology (Fath) 04/29/17  - will check a BMP today since entresto increased at his last visit - unable to titrate carvedilol due to bradycardia  2: HTN- - BP elevated today but he hasn't taken entresto yet today as he ran out; new RX provided today - continue to monitor - saw PCP Burnett Sheng([place]) 03/27/17 - BMP on 03/24/17 reviewed and showed sodium 138, potassium 4.0 and GFR >60  3: Diabetes-  - fasting glucose at home last night was ~ 200 as he ate oreo's last night - A1c on 11/05/16 was 7.1%  Patient did not bring his medications nor a list. Each medication was verbally reviewed with the patient and he was encouraged to bring the bottles to every visit to confirm accuracy  of list.   Return in 3 months or sooner for any questions/problems before then.

## 2017-09-09 NOTE — Patient Instructions (Signed)
Continue weighing daily and call for an overnight weight gain of > 2 pounds or a weekly weight gain of >5 pounds. 

## 2017-09-10 ENCOUNTER — Encounter: Payer: Self-pay | Admitting: Family

## 2017-11-19 DIAGNOSIS — I10 Essential (primary) hypertension: Secondary | ICD-10-CM | POA: Diagnosis not present

## 2017-11-19 DIAGNOSIS — E785 Hyperlipidemia, unspecified: Secondary | ICD-10-CM | POA: Diagnosis not present

## 2017-11-19 DIAGNOSIS — E1122 Type 2 diabetes mellitus with diabetic chronic kidney disease: Secondary | ICD-10-CM | POA: Diagnosis not present

## 2017-11-19 DIAGNOSIS — Z23 Encounter for immunization: Secondary | ICD-10-CM | POA: Diagnosis not present

## 2017-11-19 DIAGNOSIS — Z125 Encounter for screening for malignant neoplasm of prostate: Secondary | ICD-10-CM | POA: Diagnosis not present

## 2017-11-19 DIAGNOSIS — N182 Chronic kidney disease, stage 2 (mild): Secondary | ICD-10-CM | POA: Diagnosis not present

## 2017-11-19 DIAGNOSIS — Z0000 Encounter for general adult medical examination without abnormal findings: Secondary | ICD-10-CM | POA: Diagnosis not present

## 2017-11-19 DIAGNOSIS — B379 Candidiasis, unspecified: Secondary | ICD-10-CM | POA: Diagnosis not present

## 2017-11-24 NOTE — Progress Notes (Signed)
Patient ID: Scott Hale, male    DOB: [birth year], 67 y.o.   MRN: [id number]  HPI  Scott Hale is a 67 y/o male with a history of diabetes, HTN and chronic heart failure.   Echo report from 04/29/17 reviewed and showed an EF of 35% along with mild Scott/TR/AR. Stress test performed 04/29/17 and showed an EF of 31% along with inferolateral T wave inversions with stress.  Was in the ED 03/24/17 due to shortness of breath and a rash. BNP was elevated. He was treated and released.   He presents today for a follow-up visit with a chief complaint of minimal shortness of breath upon moderate exertion. He says this has been present for many months. He has associated fatigue along with this. He denies any difficulty sleeping, abdominal distention, palpitations, pedal edema, chest pain, dizziness, cough or weight gain.   Past Medical History:  Diagnosis Date  . CHF (congestive heart failure) (HCC)   . Diabetes mellitus without complication (HCC)   . Erectile dysfunction   . Hypertension    Past Surgical History:  Procedure Laterality Date  . COLONOSCOPY WITH PROPOFOL N/A 04/09/2017   Procedure: COLONOSCOPY WITH PROPOFOL;  Surgeon: Scott Mood, MD;  Location: [facility] ENDOSCOPY;  Service: Gastroenterology;  Laterality: N/A;  . CORONARY ARTERY BYPASS GRAFT  2003  . JOINT REPLACEMENT     Family History  Problem Relation Age of Onset  . Diabetes Mother   . Alcoholism Father   . Diabetes Sister   . Diabetes Sister   . Arrhythmia Sister   . Diabetes Sister    Social History   Tobacco Use  . Smoking status: Never Smoker  . Smokeless tobacco: Never Used  Substance Use Topics  . Alcohol use: No   No Known Allergies   Prior to Admission medications   Medication Sig Start Date End Date Taking? Authorizing Provider  ASPIRIN 81 PO Take by mouth daily.    Yes [provider]  atorvastatin (LIPITOR) 40 MG tablet Take 40 mg by mouth daily. 08/07/16  Yes [provider]  carvedilol (COREG)  6.25 MG tablet Take 6.25 mg by mouth 2 (two) times daily with a meal.   Yes [provider]  glipiZIDE (GLUCOTROL) 10 MG tablet TAKE 1 TABLET EVERY MORNING BEFORE BREAKFAST 08/07/16  Yes [provider]  insulin detemir (LEVEMIR) 100 UNIT/ML injection Inject 80 Units into the skin at bedtime.  08/20/16  Yes [provider]  sacubitril-valsartan (ENTRESTO) 49-51 MG Take 1 tablet by mouth 2 (two) times daily. 07/10/17  Yes Scott Freeze, FNP    Review of Systems  Constitutional: Positive for fatigue (improving). Negative for appetite change.  HENT: Negative for congestion, postnasal drip and sore throat.   Eyes: Negative.   Respiratory: Positive for shortness of breath (minimal). Negative for cough and chest tightness.   Cardiovascular: Negative for chest pain, palpitations and leg swelling.  Gastrointestinal: Negative for abdominal distention and abdominal pain.  Endocrine: Negative.   Genitourinary: Negative.   Musculoskeletal: Negative for back pain and neck pain.  Skin: Negative.   Allergic/Immunologic: Negative.   Neurological: Negative for dizziness and light-headedness.  Hematological: Negative for adenopathy. Does not bruise/bleed easily.  Psychiatric/Behavioral: Negative for dysphoric Hale and sleep disturbance. The patient is not nervous/anxious.    Vitals:   11/26/17 1253  BP: (!) 161/71  Pulse: (!) 58  Resp: (!) 100  SpO2: 99%  Weight: 246 lb 6 oz (111.8 kg)  Height: 6' (1.829 m)  Wt Readings from Last 3 Encounters:  11/26/17 246 lb 6 oz (111.8 kg)  09/09/17 246 lb 2 oz (111.6 kg)  07/10/17 242 lb 6 oz (109.9 kg)   Lab Results  Component Value Date   CREATININE 1.32 (H) 09/09/2017   CREATININE 1.21 07/10/2017   CREATININE 1.20 03/24/2017    Physical Exam  Constitutional: He is oriented to person, place, and time. He appears well-developed and well-nourished.  HENT:  Head: Normocephalic and atraumatic.  Neck: Normal range of motion.  Neck supple. No JVD present.  Cardiovascular: Regular rhythm. Bradycardia present.  Pulmonary/Chest: Effort normal. He has no wheezes. He has no rales.  Abdominal: Soft. He exhibits no distension. There is no tenderness.  Musculoskeletal: He exhibits no edema or tenderness.  Neurological: He is alert and oriented to person, place, and time.  Skin: Skin is warm and dry.  Psychiatric: He has a normal Hale and affect. His behavior is normal. Thought content normal.  Nursing note and vitals reviewed.  Assessment & Plan:  1: Chronic heart failure with reduced ejection fraction- - NYHA class II - euvolemic today - weighing daily and he was reminded to call for an overnight weight gain of >2 pounds or a weekly weight gain of >5 pounds - weight unchanged from last visit  2 months ago - not adding salt to his food. Reviewed the importance of closely following a 2000mg  sodium diet  - BNP 03/24/17 was 185.0 - is quite active and goes to the gym for about 1-2 hours every day  - saw cardiology (Scott Hale) 07/25/17  - unable to titrate carvedilol due to bradycardia - has gotten his flu vaccine for this season  2: HTN- - BP elevated today although slightly better upon recheck; had previously been good - discussed increasing entresto at his next visit if able - saw PCP Scott Hale) 11/19/17 - BMP on 09/09/17 reviewed and showed sodium 139, potassium 4.6, creatinine 1.32 and GFR >60  3: Diabetes-  - fasting glucose this morning at home was 97 - A1c on 11/05/16 was 7.1% - saw nephrology (Scott Hale) 08/27/17  Patient did not bring his medications nor a list. Each medication was verbally reviewed with the patient and he was encouraged to bring the bottles to every visit to confirm accuracy of list.   Return in 4 months or sooner for any questions/problems before then.

## 2017-11-26 ENCOUNTER — Ambulatory Visit: Payer: Medicare HMO | Attending: Family | Admitting: Family

## 2017-11-26 ENCOUNTER — Encounter: Payer: Self-pay | Admitting: Family

## 2017-11-26 VITALS — BP 150/80 | HR 58 | Resp 18 | Ht 72.0 in | Wt 246.4 lb

## 2017-11-26 DIAGNOSIS — Z79899 Other long term (current) drug therapy: Secondary | ICD-10-CM | POA: Diagnosis not present

## 2017-11-26 DIAGNOSIS — I5022 Chronic systolic (congestive) heart failure: Secondary | ICD-10-CM | POA: Insufficient documentation

## 2017-11-26 DIAGNOSIS — I110 Hypertensive heart disease with heart failure: Secondary | ICD-10-CM | POA: Insufficient documentation

## 2017-11-26 DIAGNOSIS — Z794 Long term (current) use of insulin: Secondary | ICD-10-CM | POA: Diagnosis not present

## 2017-11-26 DIAGNOSIS — E119 Type 2 diabetes mellitus without complications: Secondary | ICD-10-CM | POA: Diagnosis not present

## 2017-11-26 DIAGNOSIS — Z951 Presence of aortocoronary bypass graft: Secondary | ICD-10-CM | POA: Insufficient documentation

## 2017-11-26 DIAGNOSIS — Z7982 Long term (current) use of aspirin: Secondary | ICD-10-CM | POA: Insufficient documentation

## 2017-11-26 DIAGNOSIS — I10 Essential (primary) hypertension: Secondary | ICD-10-CM

## 2017-11-26 NOTE — Patient Instructions (Signed)
Continue weighing daily and call for an overnight weight gain of > 2 pounds or a weekly weight gain of >5 pounds. 

## 2017-11-27 ENCOUNTER — Encounter: Payer: Self-pay | Admitting: Family

## 2018-01-07 ENCOUNTER — Other Ambulatory Visit: Payer: Self-pay | Admitting: Family

## 2018-01-23 ENCOUNTER — Other Ambulatory Visit: Payer: Self-pay | Admitting: Family

## 2018-01-23 MED ORDER — SACUBITRIL-VALSARTAN 49-51 MG PO TABS: 1.0000 | ORAL_TABLET | Freq: Two times a day (BID) | ORAL | 3 refills | Status: DC

## 2018-02-25 DIAGNOSIS — R7309 Other abnormal glucose: Secondary | ICD-10-CM | POA: Diagnosis not present

## 2018-02-25 DIAGNOSIS — E119 Type 2 diabetes mellitus without complications: Secondary | ICD-10-CM | POA: Diagnosis not present

## 2018-02-25 DIAGNOSIS — Z0000 Encounter for general adult medical examination without abnormal findings: Secondary | ICD-10-CM | POA: Diagnosis not present

## 2018-03-23 NOTE — Progress Notes (Signed)
Patient ID: Scott Hale, male    DOB: 05/05/1950, 67 y.o.   MRN: [id number]  HPI  Scott Hale is a 68 y/o male with a history of diabetes, HTN and chronic heart failure.   Echo report from 04/29/17 reviewed and showed an EF of 35% along with mild Scott/TR/AR. Stress test performed 04/29/17 and showed an EF of 31% along with inferolateral T wave inversions with stress.  He hasn't been in the ED or admitted in the last 6 months.    He presents today for a follow-up visit with a chief complaint of minimal fatigue upon moderate exertion. He describes this as chronic in nature having been present for several years. He has no associated symptoms and currently denies any difficulty sleeping, dizziness, abdominal distention, palpitations, pedal edema, chest pain, shortness of breath, cough or weight gain. Does mention that he's having problems with mold in the apartment complex that he lives at. Says that it's been so bad that it's been on the headboard of his bed as well as his clothes.   Past Medical History:  Diagnosis Date  . CHF (congestive heart failure) (HCC)   . Diabetes mellitus without complication (HCC)   . Erectile dysfunction   . Hypertension    Past Surgical History:  Procedure Laterality Date  . COLONOSCOPY WITH PROPOFOL N/A 04/09/2017   Procedure: COLONOSCOPY WITH PROPOFOL;  Surgeon: Wyline Mood, MD;  Location: [facility] ENDOSCOPY;  Service: Gastroenterology;  Laterality: N/A;  . CORONARY ARTERY BYPASS GRAFT  2003  . JOINT REPLACEMENT     Family History  Problem Relation Age of Onset  . Diabetes Mother   . Alcoholism Father   . Diabetes Sister   . Diabetes Sister   . Arrhythmia Sister   . Diabetes Sister    Social History   Tobacco Use  . Smoking status: Never Smoker  . Smokeless tobacco: Never Used  Substance Use Topics  . Alcohol use: No   No Known Allergies   Prior to Admission medications   Medication Sig Start Date End Date Taking? Authorizing Provider  ASPIRIN 81 PO  Take by mouth daily.    Yes [provider]  atorvastatin (LIPITOR) 40 MG tablet Take 40 mg by mouth daily. 08/07/16  Yes [provider]  carvedilol (COREG) 6.25 MG tablet Take 6.25 mg by mouth 2 (two) times daily with a meal.   Yes [provider]  glipiZIDE (GLUCOTROL) 10 MG tablet TAKE 1 TABLET EVERY MORNING BEFORE BREAKFAST 08/07/16  Yes [provider]  ibuprofen (ADVIL,MOTRIN) 200 MG tablet Take 200 mg by mouth every 6 (six) hours as needed.   Yes [provider]  insulin detemir (LEVEMIR) 100 UNIT/ML injection Inject 80 Units into the skin at bedtime.  08/20/16  Yes [provider]  sacubitril-valsartan (ENTRESTO) 49-51 MG Take 1 tablet by mouth 2 (two) times daily. 01/23/18  Yes Delma Freeze, FNP    Review of Systems  Constitutional: Positive for fatigue (minimal). Negative for appetite change.  HENT: Negative for congestion, postnasal drip and sore throat.   Eyes: Negative.   Respiratory: Negative for cough, chest tightness and shortness of breath.   Cardiovascular: Negative for chest pain, palpitations and leg swelling.  Gastrointestinal: Negative for abdominal distention and abdominal pain.  Endocrine: Negative.   Genitourinary: Negative.   Musculoskeletal: Negative for back pain and neck pain.  Skin: Negative.   Allergic/Immunologic: Negative.   Neurological: Negative for dizziness and light-headedness.  Hematological: Negative for adenopathy. Does not  bruise/bleed easily.  Psychiatric/Behavioral: Negative for dysphoric mood and sleep disturbance. The patient is not nervous/anxious.    Vitals:   03/25/18 1233  BP: (!) 169/85  Pulse: (!) 55  Resp: 18  SpO2: 100%  Weight: 244 lb 4 oz (110.8 kg)  Height: 6' (1.829 m)   Wt Readings from Last 3 Encounters:  03/25/18 244 lb 4 oz (110.8 kg)  11/26/17 246 lb 6 oz (111.8 kg)  09/09/17 246 lb 2 oz (111.6 kg)   Lab Results  Component Value Date   CREATININE 1.32 (H)  09/09/2017   CREATININE 1.21 07/10/2017   CREATININE 1.20 03/24/2017    Physical Exam  Constitutional: He is oriented to person, place, and time. He appears well-developed and well-nourished.  HENT:  Head: Normocephalic and atraumatic.  Neck: Normal range of motion. Neck supple. No JVD present.  Cardiovascular: Regular rhythm. Bradycardia present.  Pulmonary/Chest: Effort normal. He has no wheezes. He has no rales.  Abdominal: Soft. He exhibits no distension. There is no abdominal tenderness.  Musculoskeletal:        General: No tenderness or edema.  Neurological: He is alert and oriented to person, place, and time.  Skin: Skin is warm and dry.  Psychiatric: He has a normal mood and affect. His behavior is normal. Thought content normal.  Nursing note and vitals reviewed.  Assessment & Plan:  1: Chronic heart failure with reduced ejection fraction- - NYHA class II - euvolemic today - weighing daily and he was reminded to call for an overnight weight gain of >2 pounds or a weekly weight gain of >5 pounds - weight stable from last visit here 4 months ago - not adding salt to his food. Reviewed the importance of closely following a 2000mg  sodium diet  - BNP 03/24/17 was 185.0 - is quite active and goes to the gym for about 1-2 hours every day  - saw cardiology (Fath) 07/25/17  - unable to titrate carvedilol due to bradycardia - will increase his entresto to 97/103mg  BID. Advised him to finish his current bottle by taking 2 tablets BID of his 49/51mg  dose until gone. Will send in paper copy of new dose to [employer] as he's getting the medication from them at this time - will check a BMP at his next visit - has gotten his flu vaccine for this season - apartment complex has been treating for mold; he is also considering finding a new place to live  2: HTN- - BP elevated today - increasing entresto per above - saw PCP Burnett Sheng) 11/19/17 - BMP on 09/09/17 reviewed and showed sodium 139,  potassium 4.6, creatinine 1.32 and GFR >60  3: Diabetes-  - A1c on 11/05/16 was 7.1% - saw nephrology ([place]) 08/27/17  Patient did not bring his medications nor a list. Each medication was verbally reviewed with the patient and he was encouraged to bring the bottles to every visit to confirm accuracy of list.   Return in 1 month or sooner for any questions/problems before then.

## 2018-03-25 ENCOUNTER — Encounter: Payer: Self-pay | Admitting: Family

## 2018-03-25 ENCOUNTER — Ambulatory Visit: Payer: Medicare HMO | Attending: Family | Admitting: Family

## 2018-03-25 VITALS — BP 169/85 | HR 55 | Resp 18 | Ht 72.0 in | Wt 244.2 lb

## 2018-03-25 DIAGNOSIS — R5383 Other fatigue: Secondary | ICD-10-CM | POA: Diagnosis not present

## 2018-03-25 DIAGNOSIS — Z833 Family history of diabetes mellitus: Secondary | ICD-10-CM | POA: Diagnosis not present

## 2018-03-25 DIAGNOSIS — Z7982 Long term (current) use of aspirin: Secondary | ICD-10-CM | POA: Diagnosis not present

## 2018-03-25 DIAGNOSIS — Z79899 Other long term (current) drug therapy: Secondary | ICD-10-CM | POA: Insufficient documentation

## 2018-03-25 DIAGNOSIS — E119 Type 2 diabetes mellitus without complications: Secondary | ICD-10-CM | POA: Diagnosis not present

## 2018-03-25 DIAGNOSIS — Z951 Presence of aortocoronary bypass graft: Secondary | ICD-10-CM | POA: Insufficient documentation

## 2018-03-25 DIAGNOSIS — Z794 Long term (current) use of insulin: Secondary | ICD-10-CM | POA: Insufficient documentation

## 2018-03-25 DIAGNOSIS — I110 Hypertensive heart disease with heart failure: Secondary | ICD-10-CM | POA: Diagnosis not present

## 2018-03-25 DIAGNOSIS — I5022 Chronic systolic (congestive) heart failure: Secondary | ICD-10-CM | POA: Diagnosis not present

## 2018-03-25 DIAGNOSIS — I10 Essential (primary) hypertension: Secondary | ICD-10-CM

## 2018-03-25 MED ORDER — SACUBITRIL-VALSARTAN 97-103 MG PO TABS: 1.0000 | ORAL_TABLET | Freq: Two times a day (BID) | ORAL | 3 refills | Status: DC

## 2018-03-25 NOTE — Patient Instructions (Addendum)
Continue weighing daily and call for an overnight weight gain of > 2 pounds or a weekly weight gain of >5 pounds.  Finish current entresto dose by taking 2 tablets twice daily until gone and then begin the 97/103mg  dose as 1 tablet twice daily

## 2018-03-26 ENCOUNTER — Encounter: Payer: Self-pay | Admitting: Family

## 2018-04-18 NOTE — Progress Notes (Signed)
Patient ID: Scott Hale, male    DOB: [birth year], 68 y.o.   MRN: [id number]  HPI  Scott Hale is a 68 y/o male with a history of diabetes, HTN and chronic heart failure.   Echo report from 04/29/17 reviewed and showed an EF of 35% along with mild Scott/TR/AR. Stress test performed 04/29/17 and showed an EF of 31% along with inferolateral T wave inversions with stress.  He hasn't been in the ED or admitted in the last 6 months.    He presents today for a follow-up visit with a chief complaint of minimal fatigue upon moderate exertion. He describes this as chronic in nature having been present for several years. Has no other associated symptoms and currently denies any difficulty sleeping, dizziness, abdominal distention, palpitations, pedal edema, chest pain, shortness of breath, cough or weight gain. Has tolerated entresto titration without known side effects.   Past Medical History:  Diagnosis Date  . CHF (congestive heart failure) (HCC)   . Diabetes mellitus without complication (HCC)   . Erectile dysfunction   . Hypertension    Past Surgical History:  Procedure Laterality Date  . COLONOSCOPY WITH PROPOFOL N/A 04/09/2017   Procedure: COLONOSCOPY WITH PROPOFOL;  Surgeon: Scott Mood, MD;  Location: [facility] ENDOSCOPY;  Service: Gastroenterology;  Laterality: N/A;  . CORONARY ARTERY BYPASS GRAFT  2003  . JOINT REPLACEMENT     Family History  Problem Relation Age of Onset  . Diabetes Mother   . Alcoholism Father   . Diabetes Sister   . Diabetes Sister   . Arrhythmia Sister   . Diabetes Sister    Social History   Tobacco Use  . Smoking status: Never Smoker  . Smokeless tobacco: Never Used  Substance Use Topics  . Alcohol use: No   No Known Allergies   Prior to Admission medications   Medication Sig Start Date End Date Taking? Authorizing Provider  ASPIRIN 81 PO Take by mouth daily.    Yes [provider]  atorvastatin (LIPITOR) 40 MG tablet Take 40 mg by mouth daily.  08/07/16  Yes [provider]  carvedilol (COREG) 6.25 MG tablet Take 6.25 mg by mouth 2 (two) times daily with a meal.   Yes [provider]  glipiZIDE (GLUCOTROL) 10 MG tablet TAKE 1 TABLET EVERY MORNING BEFORE BREAKFAST 08/07/16  Yes [provider]  ibuprofen (ADVIL,MOTRIN) 200 MG tablet Take 200 mg by mouth every 6 (six) hours as needed.   Yes [provider]  insulin detemir (LEVEMIR) 100 UNIT/ML injection Inject 80 Units into the skin at bedtime.  08/20/16  Yes [provider]  NON FORMULARY 386 mg 2 (two) times daily. Testosterone booster OTC product   Yes [provider]  sacubitril-valsartan (ENTRESTO) 97-103 MG Take 1 tablet by mouth 2 (two) times daily. 03/25/18  Yes Scott Freeze, FNP     Review of Systems  Constitutional: Positive for fatigue (minimal). Negative for appetite change.  HENT: Negative for congestion, postnasal drip and sore throat.   Eyes: Negative.   Respiratory: Negative for cough, chest tightness and shortness of breath.   Cardiovascular: Negative for chest pain, palpitations and leg swelling.  Gastrointestinal: Negative for abdominal distention and abdominal pain.  Endocrine: Negative.   Genitourinary: Negative.   Musculoskeletal: Negative for back pain and neck pain.  Skin: Negative.   Allergic/Immunologic: Negative.   Neurological: Negative for dizziness and light-headedness.  Hematological: Negative for adenopathy. Does not bruise/bleed easily.  Psychiatric/Behavioral: Negative for dysphoric Hale  and sleep disturbance. The patient is not nervous/anxious.    Vitals:   04/22/18 1338  BP: (!) 154/90  Pulse: (!) 55  Resp: 18  SpO2: 100%  Weight: 240 lb 8 oz (109.1 kg)  Height: 6' (1.829 m)   Wt Readings from Last 3 Encounters:  04/22/18 240 lb 8 oz (109.1 kg)  03/25/18 244 lb 4 oz (110.8 kg)  11/26/17 246 lb 6 oz (111.8 kg)   Lab Results  Component Value Date   CREATININE 1.32 (H) 09/09/2017    CREATININE 1.21 07/10/2017   CREATININE 1.20 03/24/2017     Physical Exam  Constitutional: He is oriented to person, place, and time. He appears well-developed and well-nourished.  HENT:  Head: Normocephalic and atraumatic.  Neck: Normal range of motion. Neck supple. No JVD present.  Cardiovascular: Regular rhythm. Bradycardia present.  Pulmonary/Chest: Effort normal. He has no wheezes. He has no rales.  Abdominal: Soft. He exhibits no distension. There is no abdominal tenderness.  Musculoskeletal:        General: No tenderness or edema.  Neurological: He is alert and oriented to person, place, and time.  Skin: Skin is warm and dry.  Psychiatric: He has a normal Hale and affect. His behavior is normal. Thought content normal.  Nursing note and vitals reviewed.  Assessment & Plan:  1: Chronic heart failure with reduced ejection fraction- - NYHA class II - euvolemic today - weighing daily and he was reminded to call for an overnight weight gain of >2 pounds or a weekly weight gain of >5 pounds - weight down 4 pounds since last here 1 month - not adding salt to his food. Reviewed the importance of closely following a 2000mg  sodium diet  - BNP 03/24/17 was 185.0 - is quite active and goes to the gym for about 1-2 hours every day  - saw cardiology (Scott Hale) 07/25/17  - unable to titrate carvedilol due to bradycardia - will check a BMP today since entresto was increased at his last visit - will add bidil as 1 tablet three times daily; explained that a headache was a common side effect  - if unable to tolerate bidil and BP remains elevated, may need to add spironolactone - has gotten his flu vaccine for this season - PharmD reconciled medications with the patient - apartment complex mold situation is much improved  2: HTN- - BP slightly elevated today; adding bidil per above - saw PCP Scott Hale) 11/19/17 - BMP on 09/09/17 reviewed and showed sodium 139, potassium 4.6, creatinine 1.32 and  GFR >60  3: Diabetes-  - A1c on 11/05/16 was 7.1% - saw nephrology (Scott Hale) 08/27/17  Patient did not bring his medications nor a list. Each medication was verbally reviewed with the patient and he was encouraged to bring the bottles to every visit to confirm accuracy of list.   Return in 5 weeks or sooner for any questions/problems before then.

## 2018-04-22 ENCOUNTER — Ambulatory Visit: Payer: Medicare HMO | Attending: Family | Admitting: Family

## 2018-04-22 ENCOUNTER — Encounter: Payer: Self-pay | Admitting: Pharmacist

## 2018-04-22 ENCOUNTER — Encounter: Payer: Self-pay | Admitting: Family

## 2018-04-22 VITALS — BP 154/90 | HR 55 | Resp 18 | Ht 72.0 in | Wt 240.5 lb

## 2018-04-22 DIAGNOSIS — Z951 Presence of aortocoronary bypass graft: Secondary | ICD-10-CM | POA: Insufficient documentation

## 2018-04-22 DIAGNOSIS — I110 Hypertensive heart disease with heart failure: Secondary | ICD-10-CM | POA: Diagnosis not present

## 2018-04-22 DIAGNOSIS — E119 Type 2 diabetes mellitus without complications: Secondary | ICD-10-CM | POA: Insufficient documentation

## 2018-04-22 DIAGNOSIS — Z794 Long term (current) use of insulin: Secondary | ICD-10-CM | POA: Diagnosis not present

## 2018-04-22 DIAGNOSIS — Z7982 Long term (current) use of aspirin: Secondary | ICD-10-CM | POA: Insufficient documentation

## 2018-04-22 DIAGNOSIS — R5383 Other fatigue: Secondary | ICD-10-CM | POA: Diagnosis not present

## 2018-04-22 DIAGNOSIS — I5022 Chronic systolic (congestive) heart failure: Secondary | ICD-10-CM | POA: Diagnosis not present

## 2018-04-22 DIAGNOSIS — Z79899 Other long term (current) drug therapy: Secondary | ICD-10-CM | POA: Diagnosis not present

## 2018-04-22 DIAGNOSIS — I10 Essential (primary) hypertension: Secondary | ICD-10-CM

## 2018-04-22 LAB — BASIC METABOLIC PANEL
Anion gap: 9 (ref 5–15)
BUN: 12 mg/dL (ref 8–23)
CO2: 26 mmol/L (ref 22–32)
CREATININE: 1.13 mg/dL (ref 0.61–1.24)
Calcium: 9.4 mg/dL (ref 8.9–10.3)
Chloride: 102 mmol/L (ref 98–111)
GFR calc Af Amer: >60 mL/min (ref >60)
GFR calc non Af Amer: >60 mL/min (ref >60)
Glucose, Bld: 105 mg/dL — ABNORMAL HIGH (ref 70–99)
Potassium: 4.3 mmol/L (ref 3.5–5.1)
SODIUM: 137 mmol/L (ref 135–145)

## 2018-04-22 MED ORDER — ISOSORB DINITRATE-HYDRALAZINE 20-37.5 MG PO TABS: 1.0000 | ORAL_TABLET | Freq: Three times a day (TID) | ORAL | 3 refills | Status: DC

## 2018-04-22 NOTE — Progress Notes (Signed)
[facility] - HEART FAILURE CLINIC - PHARMACIST COUNSELING NOTE  ADHERENCE ASSESSMENT  Adherence strategy: keeps medications together   Do you ever forget to take your medication? [] Yes (1) [x] No (0)  Do you ever skip doses due to side effects? [] Yes (1) [x] No (0)  Do you have trouble affording your medicines? [] Yes (1) [x] No (0)  Are you ever unable to pick up your medication due to transportation difficulties? [] Yes (1) [x] No (0)  Do you ever stop taking your medications because you don't believe they are helping? [] Yes (1) [x] No (0)  Total score _0______    Recommendations given to patient about increasing adherence: None. Patient reports good adherence with his current strategy. He had an issue with the cost of Entresto but that has been resolved.  Guideline-Directed Medical Therapy/Evidence Based Medicine    ACE/ARB/ARNI: Sherryll Burger 97/103 mg twice daily   Beta Blocker: carvedilol 6.25 mg twice daily   Aldosterone Antagonist: none Diuretic: none    SUBJECTIVE   HPI: Here for follow up visit. Entresto increased at last visit. Denies dizziness/light-headedness.   Past Medical History:  Diagnosis Date  . CHF (congestive heart failure) (HCC)   . Diabetes mellitus without complication (HCC)   . Erectile dysfunction   . Hypertension         OBJECTIVE    Vital signs: HR 55, BP 154/90, weight (pounds) 240.8  ECHO: Date 04/29/17, EF 35%    BMP Latest Ref Rng & Units 09/09/2017 07/10/2017 03/24/2017  Glucose 70 - 99 mg/dL 237(S) 283(T) 517(O)  BUN 8 - 23 mg/dL 10 15 10   Creatinine 0.61 - 1.24 mg/dL 1.60(V) 3.71 0.62  Sodium 135 - 145 mmol/L 139 133(L) 138  Potassium 3.5 - 5.1 mmol/L 4.6 4.7 4.0  Chloride 98 - 111 mmol/L 104 101 105  CO2 22 - 32 mmol/L 26 26 25   Calcium 8.9 - 10.3 mg/dL 9.5 9.1 9.2    ASSESSMENT 68 year old male with HFrEF. Entresto increased at last visit. Patient denies dizziness/light-headedness. He is taking all medications as  prescribed. BP has improved since last visit but continues to remain elevated. He reports he has not had any significant weight gain.   PLAN Patient to get labs drawn today. If labs stable, will add Bidil 20/37.5 mg three times daily.    Time spent: 10 minutes  Abby K Ellington, Pharm.D. 04/22/2018 2:28 PM    Current Outpatient Medications:  .  ASPIRIN 81 PO, Take by mouth daily. , Disp: , Rfl:  .  atorvastatin (LIPITOR) 40 MG tablet, Take 40 mg by mouth daily., Disp: , Rfl:  .  carvedilol (COREG) 6.25 MG tablet, Take 6.25 mg by mouth 2 (two) times daily with a meal., Disp: , Rfl:  .  glipiZIDE (GLUCOTROL) 10 MG tablet, TAKE 1 TABLET EVERY MORNING BEFORE BREAKFAST, Disp: , Rfl:  .  ibuprofen (ADVIL,MOTRIN) 200 MG tablet, Take 200 mg by mouth every 6 (six) hours as needed., Disp: , Rfl:  .  insulin detemir (LEVEMIR) 100 UNIT/ML injection, Inject 80 Units into the skin at bedtime. , Disp: , Rfl:  .  isosorbide-hydrALAZINE (BIDIL) 20-37.5 MG tablet, Take 1 tablet by mouth 3 (three) times daily., Disp: 90 tablet, Rfl: 3 .  NON FORMULARY, 386 mg 2 (two) times daily. Testosterone booster OTC product, Disp: , Rfl:  .  sacubitril-valsartan (ENTRESTO) 97-103 MG, Take 1 tablet by mouth 2 (two) times daily., Disp: 180 tablet, Rfl: 3   COUNSELING POINTS/CLINICAL PEARLS  Carvedilol (Goal: weight  less than 85 kg is 25 mg BID, weight greater than 85 kg is 50 mg BID)  Patient should avoid activities requiring coordination until drug effects are realized, as drug may cause dizziness.  This drug may cause diarrhea, nausea, vomiting, arthralgia, back pain, myalgia, headache, vision disorder, erectile dysfunction, reduced libido, or fatigue.  Instruct patient to report signs/symptoms of adverse cardiovascular effects such as hypotension (especially in elderly patients), arrhythmias, syncope, palpitations, angina, or edema.  Drug may mask symptoms of hypoglycemia. Advise diabetic patients to carefully  monitor blood sugar levels.  Patient should take drug with food.  Advise patient against sudden discontinuation of drug. Entresto (Goal: 97/103 mg twice daily)  Warn male patient to avoid pregnancy during therapy and to report a pregnancy to a physician.  Advise patient to report symptomatic hypotension.  Side effects may include hyperkalemia, cough, dizziness, or renal failure.   DRUGS TO AVOID IN HEART FAILURE  Drug or Class Mechanism  Analgesics . NSAIDs . COX-2 inhibitors . Glucocorticoids  Sodium and water retention, increased systemic vascular resistance, decreased response to diuretics   Diabetes Medications . Metformin . Thiazolidinediones o Rosiglitazone (Avandia) o Pioglitazone (Actos) . DPP4 Inhibitors o Saxagliptin (Onglyza) o Sitagliptin (Januvia)   Lactic acidosis Possible calcium channel blockade   Unknown  Antiarrhythmics . Class I  o Flecainide o Disopyramide . Class III o Sotalol . Other o Dronedarone  Negative inotrope, proarrhythmic   Proarrhythmic, beta blockade  Negative inotrope  Antihypertensives . Alpha Blockers o Doxazosin . Calcium Channel Blockers o Diltiazem o Verapamil o Nifedipine . Central Alpha Adrenergics o Moxonidine . Peripheral Vasodilators o Minoxidil  Increases renin and aldosterone  Negative inotrope    Possible sympathetic withdrawal  Unknown  Anti-infective . Itraconazole . Amphotericin B  Negative inotrope Unknown  Hematologic . Anagrelide . Cilostazol   Possible inhibition of PD IV Inhibition of PD III causing arrhythmias  Neurologic/Psychiatric . Stimulants . Anti-Seizure Drugs o Carbamazepine o Pregabalin . Antidepressants o Tricyclics o Citalopram . Parkinsons o Bromocriptine o Pergolide o Pramipexole . Antipsychotics o Clozapine . Antimigraine o Ergotamine o Methysergide . Appetite suppressants . Bipolar o Lithium  Peripheral alpha and beta agonist activity  Negative  inotrope and chronotrope Calcium channel blockade  Negative inotrope, proarrhythmic Dose-dependent QT prolongation  Excessive serotonin activity/valvular damage Excessive serotonin activity/valvular damage Unknown  IgE mediated hypersensitivy, calcium channel blockade  Excessive serotonin activity/valvular damage Excessive serotonin activity/valvular damage Valvular damage  Direct myofibrillar degeneration, adrenergic stimulation  Antimalarials . Chloroquine . Hydroxychloroquine Intracellular inhibition of lysosomal enzymes  Urologic Agents . Alpha Blockers o Doxazosin o Prazosin o Tamsulosin o Terazosin  Increased renin and aldosterone  Adapted from Page RL, et al. "Drugs That May Cause or Exacerbate Heart Failure: A Scientific Statement from the American Heart  Association." Circulation 2016; 134:e32-e69. DOI: 10.1161/CIR.0000000000000426   MEDICATION ADHERENCES TIPS AND STRATEGIES 1. Taking medication as prescribed improves patient outcomes in heart failure (reduces hospitalizations, improves symptoms, increases survival) 2. Side effects of medications can be managed by decreasing doses, switching agents, stopping drugs, or adding additional therapy. Please let someone in the Heart Failure Clinic know if you have having bothersome side effects so we can modify your regimen. Do not alter your medication regimen without talking to [place].  3. Medication reminders can help patients remember to take drugs on time. If you are missing or forgetting doses you can try linking behaviors, using pill boxes, or an electronic reminder like an alarm on your phone or an app. Some  people can also get automated phone calls as medication reminders.

## 2018-04-22 NOTE — Patient Instructions (Signed)
Continue weighing daily and call for an overnight weight gain of > 2 pounds or a weekly weight gain of >5 pounds. 

## 2018-04-23 ENCOUNTER — Encounter: Payer: Self-pay | Admitting: Family

## 2018-04-23 ENCOUNTER — Telehealth: Payer: Self-pay

## 2018-04-23 NOTE — Telephone Encounter (Signed)
Patient is enrolled in the [facility] [employer] for assistance with BIDIL.   Member ID: [id number]  Group ID: [id number]  RxBIN ID: [id number] PCN: [id number] Eligibility End: 04/22/2019   Assistance Amount: $1000  Patient advised to only use on Bidil prescription and pharmacy given information.

## 2018-05-08 ENCOUNTER — Other Ambulatory Visit: Payer: Self-pay | Admitting: Family

## 2018-05-08 MED ORDER — SACUBITRIL-VALSARTAN 97-103 MG PO TABS: 1.0000 | ORAL_TABLET | Freq: Two times a day (BID) | ORAL | 1 refills | Status: DC

## 2018-05-27 ENCOUNTER — Ambulatory Visit: Payer: Medicare HMO | Admitting: Family

## 2018-07-21 ENCOUNTER — Ambulatory Visit: Payer: Medicare HMO | Attending: Family | Admitting: Family

## 2018-07-21 ENCOUNTER — Other Ambulatory Visit: Payer: Self-pay

## 2018-07-21 ENCOUNTER — Encounter: Payer: Self-pay | Admitting: Family

## 2018-07-21 VITALS — BP 152/85 | HR 62 | Resp 18 | Ht 72.0 in | Wt 252.0 lb

## 2018-07-21 DIAGNOSIS — I10 Essential (primary) hypertension: Secondary | ICD-10-CM

## 2018-07-21 DIAGNOSIS — Z794 Long term (current) use of insulin: Secondary | ICD-10-CM | POA: Diagnosis not present

## 2018-07-21 DIAGNOSIS — Z79899 Other long term (current) drug therapy: Secondary | ICD-10-CM | POA: Diagnosis not present

## 2018-07-21 DIAGNOSIS — E119 Type 2 diabetes mellitus without complications: Secondary | ICD-10-CM | POA: Diagnosis not present

## 2018-07-21 DIAGNOSIS — Z7982 Long term (current) use of aspirin: Secondary | ICD-10-CM | POA: Diagnosis not present

## 2018-07-21 DIAGNOSIS — I110 Hypertensive heart disease with heart failure: Secondary | ICD-10-CM | POA: Insufficient documentation

## 2018-07-21 DIAGNOSIS — Z951 Presence of aortocoronary bypass graft: Secondary | ICD-10-CM | POA: Insufficient documentation

## 2018-07-21 DIAGNOSIS — I509 Heart failure, unspecified: Secondary | ICD-10-CM | POA: Diagnosis present

## 2018-07-21 DIAGNOSIS — Z811 Family history of alcohol abuse and dependence: Secondary | ICD-10-CM | POA: Insufficient documentation

## 2018-07-21 DIAGNOSIS — Z833 Family history of diabetes mellitus: Secondary | ICD-10-CM | POA: Insufficient documentation

## 2018-07-21 DIAGNOSIS — R21 Rash and other nonspecific skin eruption: Secondary | ICD-10-CM | POA: Diagnosis not present

## 2018-07-21 DIAGNOSIS — I5022 Chronic systolic (congestive) heart failure: Secondary | ICD-10-CM | POA: Insufficient documentation

## 2018-07-21 NOTE — Patient Instructions (Signed)
Continue weighing daily and call for an overnight weight gain of > 2 pounds or a weekly weight gain of >5 pounds. 

## 2018-07-21 NOTE — Progress Notes (Signed)
Patient ID: Scott Hale, male    DOB: [birth year], 68 y.o.   MRN: [id number]  HPI  Scott Hale is a 68 y/o male with a history of diabetes, HTN and chronic heart failure.   Echo report from 04/29/17 reviewed and showed an EF of 35% along with mild Scott/TR/AR. Stress test performed 04/29/17 and showed an EF of 31% along with inferolateral T wave inversions with stress.  He hasn't been in the ED or admitted in the last 6 months.    He presents today for a follow-up visit with a chief complaint of minimal shortness of breath. He describes this as chronic in nature having been present for several years on an intermittent basis. He has associated fatigue & gradual weight gain along with this. He denies any difficulty sleeping, abdominal distention, palpitations, pedal edema, chest pain, cough or dizziness. Says that due to COVID-19 and his gym being shut down, he hasn't been doing any cardio so has gradually put on weight.   Past Medical History:  Diagnosis Date  . CHF (congestive heart failure) (HCC)   . Diabetes mellitus without complication (HCC)   . Erectile dysfunction   . Hypertension    Past Surgical History:  Procedure Laterality Date  . COLONOSCOPY WITH PROPOFOL N/A 04/09/2017   Procedure: COLONOSCOPY WITH PROPOFOL;  Surgeon: Wyline MoodAnna, Kiran, MD;  [facility];  Service: Gastroenterology;  Laterality: N/A;  . CORONARY ARTERY BYPASS GRAFT  2003  . JOINT REPLACEMENT     Family History  Problem Relation Age of Onset  . Diabetes Mother   . Alcoholism Father   . Diabetes Sister   . Diabetes Sister   . Arrhythmia Sister   . Diabetes Sister    Social History   Tobacco Use  . Smoking status: Never Smoker  . Smokeless tobacco: Never Used  Substance Use Topics  . Alcohol use: No   No Known Allergies   Prior to Admission medications   Medication Sig Start Date End Date Taking? Authorizing Provider  ASPIRIN 81 PO Take by mouth daily.    Yes [provider]   atorvastatin (LIPITOR) 40 MG tablet Take 40 mg by mouth daily. 08/07/16  Yes [provider]  carvedilol (COREG) 6.25 MG tablet Take 6.25 mg by mouth 2 (two) times daily with a meal.   Yes [provider]  glipiZIDE (GLUCOTROL) 10 MG tablet TAKE 1 TABLET EVERY MORNING BEFORE BREAKFAST 08/07/16  Yes [provider]  ibuprofen (ADVIL,MOTRIN) 200 MG tablet Take 200 mg by mouth every 6 (six) hours as needed.   Yes [provider]  insulin detemir (LEVEMIR) 100 UNIT/ML injection Inject 80 Units into the skin at bedtime.  08/20/16  Yes [provider]  isosorbide-hydrALAZINE (BIDIL) 20-37.5 MG tablet Take 1 tablet by mouth 3 (three) times daily. 04/22/18  Yes Raymona Boss A, FNP  NON FORMULARY 386 mg 2 (two) times daily. Testosterone booster OTC product   Yes [provider]  sacubitril-valsartan (ENTRESTO) 97-103 MG Take 1 tablet by mouth 2 (two) times daily. 05/08/18  Yes Delma FreezeHackney, Gissella Niblack A, FNP    Review of Systems  Constitutional: Positive for fatigue (minimal). Negative for appetite change.  HENT: Negative for congestion, postnasal drip and sore throat.   Eyes: Negative.   Respiratory: Positive for shortness of breath. Negative for cough and chest tightness.   Cardiovascular: Negative for chest pain, palpitations and leg swelling.  Gastrointestinal: Negative for abdominal distention and abdominal pain.  Endocrine: Negative.   Genitourinary:  Negative.   Musculoskeletal: Negative for back pain and neck pain.  Skin: Positive for rash (behind left upper arm).  Allergic/Immunologic: Negative.   Neurological: Negative for dizziness and light-headedness.  Hematological: Negative for adenopathy. Does not bruise/bleed easily.  Psychiatric/Behavioral: Negative for dysphoric mood and sleep disturbance. The patient is not nervous/anxious.    Vitals:   07/21/18 1321  BP: (!) 152/85  Pulse: 62  Resp: 18  SpO2: 100%  Weight: 252 lb (114.3 kg)  Height:  6' (1.829 m)   Wt Readings from Last 3 Encounters:  07/21/18 252 lb (114.3 kg)  04/22/18 240 lb 8 oz (109.1 kg)  03/25/18 244 lb 4 oz (110.8 kg)   Lab Results  Component Value Date   CREATININE 1.13 04/22/2018   CREATININE 1.32 (H) 09/09/2017   CREATININE 1.21 07/10/2017    Physical Exam  Constitutional: He is oriented to person, place, and time. He appears well-developed and well-nourished.  HENT:  Head: Normocephalic and atraumatic.  Neck: Normal range of motion. Neck supple. No JVD present.  Cardiovascular: Regular rhythm. Bradycardia present.  Pulmonary/Chest: Effort normal. He has no wheezes. He has no rales.  Abdominal: Soft. He exhibits no distension. There is no abdominal tenderness.  Musculoskeletal:        General: No tenderness or edema.  Neurological: He is alert and oriented to person, place, and time.  Skin: Skin is warm and dry. Rash noted. Rash is papular.     Psychiatric: He has a normal mood and affect. His behavior is normal. Thought content normal.  Nursing note and vitals reviewed.  Assessment & Plan:  1: Chronic heart failure with reduced ejection fraction- - NYHA class II - euvolemic today - weighing daily and he was reminded to call for an overnight weight gain of >2 pounds or a weekly weight gain of >5 pounds - weight up 12 pounds from last visit here 3 months ago - says that due to not having access to his gym, he's gradually gained weight - not adding salt to his food. Reviewed the importance of closely following a 2000mg  sodium diet  - BNP 03/24/17 was 185.0 - saw cardiology (Fath) 07/25/17  - unable to titrate carvedilol due to bradycardia - tolerating bidil without known side effects; 84 samples of bidil given to patient  2: HTN- - BP mildly elevated today - bidil added at last visit - saw PCP Kary Kos) 11/19/17 - BMP on 04/22/2018 reviewed and showed sodium 137, potassium 4.3, creatinine 1.13 and GFR >60  3: Diabetes-  - A1c on 11/05/16  was 7.1% - saw nephrology (Kshirsagar) 08/27/17  4: Rash- - patient says this has been present for ~ 3 days and is quite painful - 3 areas are scabbed over and doesn't appear vesicular at this time - encouraged him to call his PCP to have rash evaluated  Patient did not bring his medications nor a list. Each medication was verbally reviewed with the patient and he was encouraged to bring the bottles to every visit to confirm accuracy of list.   Return in 4 months or sooner for any questions/problems before then.

## 2018-07-22 DIAGNOSIS — B029 Zoster without complications: Secondary | ICD-10-CM | POA: Diagnosis not present

## 2018-08-20 ENCOUNTER — Other Ambulatory Visit: Payer: Self-pay | Admitting: Family

## 2018-08-26 DIAGNOSIS — I509 Heart failure, unspecified: Secondary | ICD-10-CM | POA: Diagnosis not present

## 2018-08-26 DIAGNOSIS — N529 Male erectile dysfunction, unspecified: Secondary | ICD-10-CM | POA: Diagnosis not present

## 2018-08-26 DIAGNOSIS — E1122 Type 2 diabetes mellitus with diabetic chronic kidney disease: Secondary | ICD-10-CM | POA: Diagnosis not present

## 2018-08-26 DIAGNOSIS — N182 Chronic kidney disease, stage 2 (mild): Secondary | ICD-10-CM | POA: Diagnosis not present

## 2018-09-18 ENCOUNTER — Other Ambulatory Visit: Payer: Self-pay | Admitting: Family

## 2018-11-17 ENCOUNTER — Ambulatory Visit: Payer: Medicare HMO | Admitting: Family

## 2018-11-17 DIAGNOSIS — I10 Essential (primary) hypertension: Secondary | ICD-10-CM | POA: Diagnosis not present

## 2018-11-17 DIAGNOSIS — E1122 Type 2 diabetes mellitus with diabetic chronic kidney disease: Secondary | ICD-10-CM | POA: Diagnosis not present

## 2018-11-17 DIAGNOSIS — N182 Chronic kidney disease, stage 2 (mild): Secondary | ICD-10-CM | POA: Diagnosis not present

## 2018-11-17 DIAGNOSIS — E785 Hyperlipidemia, unspecified: Secondary | ICD-10-CM | POA: Diagnosis not present

## 2018-11-17 DIAGNOSIS — Z125 Encounter for screening for malignant neoplasm of prostate: Secondary | ICD-10-CM | POA: Diagnosis not present

## 2018-11-17 NOTE — Progress Notes (Signed)
Patient ID: Scott Hale, male    DOB: 1951-02-01, 68 y.o.   MRN: [id number]  HPI  Scott Hale is a 68 y/o male with a history of diabetes, HTN and chronic heart failure.   Echo report from 04/29/17 reviewed and showed an EF of 35% along with mild Scott/TR/AR. Stress test performed 04/29/17 and showed an EF of 31% along with inferolateral T wave inversions with stress.  He hasn't been in the ED or admitted in the last 6 months.    He presents today for a follow-up visit with a chief complaint of minimal shortness of breath upon moderate exertion. He describes this as chronic in nature having been present for several years. He has associated fatigue along with this. He denies any difficulty sleeping, dizziness, abdominal distention, palpitations, pedal edema, chest pain, cough or weight gain. He says that he wakes up feeling just as tired as when he went to sleep and thinks he snores because his partner has woken him up before.    He says that he's resumed going to the gym and lifting weights. He says that he can not afford to pay for the entresto and bidil and took his last bidil yesterday and only has 1 more entresto left.   Past Medical History:  Diagnosis Date  . CHF (congestive heart failure) ([place])   . Diabetes mellitus without complication ([place])   . Erectile dysfunction   . Hypertension    Past Surgical History:  Procedure Laterality Date  . COLONOSCOPY WITH PROPOFOL N/A 04/09/2017   Procedure: COLONOSCOPY WITH PROPOFOL;  Surgeon: Jonathon Bellows, MD;  Location: [facility] ENDOSCOPY;  Service: Gastroenterology;  Laterality: N/A;  . CORONARY ARTERY BYPASS GRAFT  2003  . JOINT REPLACEMENT     Family History  Problem Relation Age of Onset  . Diabetes Mother   . Alcoholism Father   . Diabetes Sister   . Diabetes Sister   . Arrhythmia Sister   . Diabetes Sister    Social History   Tobacco Use  . Smoking status: Never Smoker  . Smokeless tobacco: Never Used  Substance Use Topics  . Alcohol  use: No   No Known Allergies   Prior to Admission medications   Medication Sig Start Date End Date Taking? Authorizing Provider  ASPIRIN 81 PO Take by mouth daily.    Yes [provider]  atorvastatin (LIPITOR) 40 MG tablet Take 40 mg by mouth daily. 08/07/16  Yes [provider]  carvedilol (COREG) 6.25 MG tablet Take 6.25 mg by mouth 2 (two) times daily with a meal.   Yes [provider]  cholecalciferol (VITAMIN D) 25 MCG (1000 UT) tablet Take 1,000 Units by mouth daily.   Yes [provider]  ENTRESTO 97-103 MG Take 1 tablet by mouth twice daily 08/21/18  Yes ,  A, FNP  glipiZIDE (GLUCOTROL) 10 MG tablet TAKE 1 TABLET EVERY MORNING BEFORE BREAKFAST 08/07/16  Yes [provider]  ibuprofen (ADVIL,MOTRIN) 200 MG tablet Take 200 mg by mouth every 6 (six) hours as needed.   Yes [provider]  insulin detemir (LEVEMIR) 100 UNIT/ML injection Inject 80 Units into the skin at bedtime.  08/20/16  Yes [provider]  vitamin B-12 (CYANOCOBALAMIN) 500 MCG tablet Take 500 mcg by mouth daily.   Yes [provider]  BIDIL 20-37.5 MG tablet TAKE 1 TABLET BY MOUTH THREE TIMES DAILY Patient not taking: Reported on 11/18/2018 09/18/18   [name], FNP  NON [id number] 568  mg 2 (two) times daily. Testosterone booster OTC product    [provider]     Review of Systems  Constitutional: Positive for fatigue (minimal). Negative for appetite change.  HENT: Negative for congestion, postnasal drip and sore throat.   Eyes: Negative.   Respiratory: Positive for shortness of breath. Negative for cough and chest tightness.   Cardiovascular: Negative for chest pain, palpitations and leg swelling.  Gastrointestinal: Negative for abdominal distention and abdominal pain.  Endocrine: Negative.   Genitourinary: Negative.   Musculoskeletal: Negative for back pain and neck pain.  Skin: Negative.   Allergic/Immunologic:  Negative.   Neurological: Negative for dizziness and light-headedness.  Hematological: Negative for adenopathy. Does not bruise/bleed easily.  Psychiatric/Behavioral: Negative for dysphoric mood and sleep disturbance. The patient is not nervous/anxious.    Vitals:   11/18/18 1046  BP: (!) 167/84  Pulse: 62  Resp: 18  SpO2: 100%  Weight: 247 lb 2 oz (112.1 kg)  Height: 6' (1.829 m)   Wt Readings from Last 3 Encounters:  11/18/18 247 lb 2 oz (112.1 kg)  07/21/18 252 lb (114.3 kg)  04/22/18 240 lb 8 oz (109.1 kg)   Lab Results  Component Value Date   CREATININE 1.13 04/22/2018   CREATININE 1.32 (H) 09/09/2017   CREATININE 1.21 07/10/2017    Physical Exam  Constitutional: He is oriented to person, place, and time. He appears well-developed and well-nourished.  HENT:  Head: Normocephalic and atraumatic.  Neck: Normal range of motion. Neck supple. No JVD present.  Cardiovascular: Regular rhythm. Bradycardia present.  Pulmonary/Chest: Effort normal. He has no wheezes. He has no rales.  Abdominal: Soft. He exhibits no distension. There is no abdominal tenderness.  Musculoskeletal:        General: No tenderness or edema.  Neurological: He is alert and oriented to person, place, and time.  Skin: Skin is warm and dry.  Psychiatric: He has a normal mood and affect. His behavior is normal. Thought content normal.  Nursing note and vitals reviewed.  Assessment & Plan:  1: Chronic heart failure with reduced ejection fraction- - NYHA class II - euvolemic today - weighing daily and he was reminded to call for an overnight weight gain of >2 pounds or a weekly weight gain of >5 pounds - weight down 5 pounds from last visit here 4 months ago - has resumed going to the gym and lifting weights - not adding salt to his food. Reviewed the importance of closely following a 2000mg  sodium diet  - BNP 03/24/17 was 185.0 - saw cardiology (Fath) 07/25/17  - will stop bidil as it's unaffordable to  patient - samples provided of entresto 49/51mg  and he was instructed to take 2 tablets BID; [employer] paperwork filled out for him - will add spironolactone 25mg  daily - will check BMP at his next visit - consider adding farxiga at future visits - unable to titrate carvedilol due to bradycardia  2: HTN- - BP elevated today; adding spironolactone per above - saw PCP 07/27/17) 08/26/2018 - BMP on 11/17/2018 reviewed and showed sodium 135, potassium 4.1, creatinine 1.1 and GFR 81  3: Diabetes-  - A1c on 11/17/2018 was 8.2% - saw nephrology (Kshirsagar) 08/27/17 - glucose 86 this morning  4: Snoring- - will refer for sleep study to rule out sleep apnea  Medication bottles were reviewed.   Return in 1 month or sooner for amy questions/problems before then.

## 2018-11-18 ENCOUNTER — Encounter: Payer: Self-pay | Admitting: Family

## 2018-11-18 ENCOUNTER — Other Ambulatory Visit: Payer: Self-pay

## 2018-11-18 ENCOUNTER — Ambulatory Visit: Payer: Medicare HMO | Attending: Family | Admitting: Family

## 2018-11-18 VITALS — BP 167/84 | HR 62 | Resp 18 | Ht 72.0 in | Wt 247.1 lb

## 2018-11-18 DIAGNOSIS — Z794 Long term (current) use of insulin: Secondary | ICD-10-CM | POA: Insufficient documentation

## 2018-11-18 DIAGNOSIS — Z833 Family history of diabetes mellitus: Secondary | ICD-10-CM | POA: Diagnosis not present

## 2018-11-18 DIAGNOSIS — I110 Hypertensive heart disease with heart failure: Secondary | ICD-10-CM | POA: Diagnosis not present

## 2018-11-18 DIAGNOSIS — Z79899 Other long term (current) drug therapy: Secondary | ICD-10-CM | POA: Diagnosis not present

## 2018-11-18 DIAGNOSIS — Z811 Family history of alcohol abuse and dependence: Secondary | ICD-10-CM | POA: Diagnosis not present

## 2018-11-18 DIAGNOSIS — Z8249 Family history of ischemic heart disease and other diseases of the circulatory system: Secondary | ICD-10-CM | POA: Insufficient documentation

## 2018-11-18 DIAGNOSIS — R0683 Snoring: Secondary | ICD-10-CM | POA: Insufficient documentation

## 2018-11-18 DIAGNOSIS — Z951 Presence of aortocoronary bypass graft: Secondary | ICD-10-CM | POA: Insufficient documentation

## 2018-11-18 DIAGNOSIS — E119 Type 2 diabetes mellitus without complications: Secondary | ICD-10-CM | POA: Insufficient documentation

## 2018-11-18 DIAGNOSIS — Z7982 Long term (current) use of aspirin: Secondary | ICD-10-CM | POA: Diagnosis not present

## 2018-11-18 DIAGNOSIS — I5022 Chronic systolic (congestive) heart failure: Secondary | ICD-10-CM

## 2018-11-18 DIAGNOSIS — I10 Essential (primary) hypertension: Secondary | ICD-10-CM

## 2018-11-18 MED ORDER — SPIRONOLACTONE 25 MG PO TABS: 25.0000 mg | ORAL_TABLET | Freq: Every day | ORAL | 5 refills | Status: DC

## 2018-11-18 NOTE — Patient Instructions (Signed)
Continue weighing daily and call for an overnight weight gain of > 2 pounds or a weekly weight gain of >5 pounds. 

## 2018-11-24 DIAGNOSIS — Z0000 Encounter for general adult medical examination without abnormal findings: Secondary | ICD-10-CM | POA: Diagnosis not present

## 2018-11-24 DIAGNOSIS — E785 Hyperlipidemia, unspecified: Secondary | ICD-10-CM | POA: Diagnosis not present

## 2018-11-24 DIAGNOSIS — Z23 Encounter for immunization: Secondary | ICD-10-CM | POA: Diagnosis not present

## 2018-11-24 DIAGNOSIS — I10 Essential (primary) hypertension: Secondary | ICD-10-CM | POA: Diagnosis not present

## 2018-11-24 DIAGNOSIS — E1165 Type 2 diabetes mellitus with hyperglycemia: Secondary | ICD-10-CM | POA: Diagnosis not present

## 2018-12-10 ENCOUNTER — Encounter: Payer: Self-pay | Admitting: Internal Medicine

## 2018-12-10 ENCOUNTER — Ambulatory Visit: Payer: Medicare HMO | Attending: Internal Medicine

## 2018-12-10 DIAGNOSIS — G4733 Obstructive sleep apnea (adult) (pediatric): Secondary | ICD-10-CM | POA: Diagnosis not present

## 2018-12-10 DIAGNOSIS — I10 Essential (primary) hypertension: Secondary | ICD-10-CM | POA: Insufficient documentation

## 2018-12-10 DIAGNOSIS — R0681 Apnea, not elsewhere classified: Secondary | ICD-10-CM | POA: Diagnosis present

## 2018-12-11 ENCOUNTER — Other Ambulatory Visit: Payer: Self-pay

## 2018-12-15 ENCOUNTER — Encounter: Payer: Self-pay | Admitting: Family

## 2018-12-15 ENCOUNTER — Other Ambulatory Visit: Payer: Self-pay

## 2018-12-15 ENCOUNTER — Ambulatory Visit: Payer: Medicare HMO | Attending: Family | Admitting: Family

## 2018-12-15 VITALS — BP 140/85 | HR 58 | Resp 20 | Ht 72.0 in | Wt 240.4 lb

## 2018-12-15 DIAGNOSIS — Z8249 Family history of ischemic heart disease and other diseases of the circulatory system: Secondary | ICD-10-CM | POA: Insufficient documentation

## 2018-12-15 DIAGNOSIS — E119 Type 2 diabetes mellitus without complications: Secondary | ICD-10-CM | POA: Insufficient documentation

## 2018-12-15 DIAGNOSIS — R0683 Snoring: Secondary | ICD-10-CM | POA: Insufficient documentation

## 2018-12-15 DIAGNOSIS — Z7982 Long term (current) use of aspirin: Secondary | ICD-10-CM | POA: Diagnosis not present

## 2018-12-15 DIAGNOSIS — I5022 Chronic systolic (congestive) heart failure: Secondary | ICD-10-CM | POA: Insufficient documentation

## 2018-12-15 DIAGNOSIS — Z79899 Other long term (current) drug therapy: Secondary | ICD-10-CM | POA: Diagnosis not present

## 2018-12-15 DIAGNOSIS — Z883 Allergy status to other anti-infective agents status: Secondary | ICD-10-CM | POA: Insufficient documentation

## 2018-12-15 DIAGNOSIS — Z951 Presence of aortocoronary bypass graft: Secondary | ICD-10-CM | POA: Diagnosis not present

## 2018-12-15 DIAGNOSIS — I110 Hypertensive heart disease with heart failure: Secondary | ICD-10-CM | POA: Insufficient documentation

## 2018-12-15 DIAGNOSIS — I10 Essential (primary) hypertension: Secondary | ICD-10-CM

## 2018-12-15 DIAGNOSIS — Z794 Long term (current) use of insulin: Secondary | ICD-10-CM | POA: Diagnosis not present

## 2018-12-15 LAB — BASIC METABOLIC PANEL
Anion gap: 9 (ref 5–15)
BUN: 13 mg/dL (ref 8–23)
CO2: 27 mmol/L (ref 22–32)
Calcium: 9.3 mg/dL (ref 8.9–10.3)
Chloride: 102 mmol/L (ref 98–111)
Creatinine, Ser: 1.14 mg/dL (ref 0.61–1.24)
GFR calc Af Amer: >60 mL/min (ref >60)
GFR calc non Af Amer: >60 mL/min (ref >60)
Glucose, Bld: 99 mg/dL (ref 70–99)
Potassium: 4.3 mmol/L (ref 3.5–5.1)
Sodium: 138 mmol/L (ref 135–145)

## 2018-12-15 NOTE — Progress Notes (Signed)
Patient ID: Scott Hale, male    DOB: 1950/09/15, 68 y.o.   MRN: [id number]  HPI  Scott Hale is Hale 68 y/o male with Hale history of diabetes, HTN and chronic heart failure.   Echo report from 04/29/17 reviewed and showed an EF of 35% along with mild Scott/TR/AR. Stress test performed 04/29/17 and showed an EF of 31% along with inferolateral T wave inversions with stress.  He hasn't been in the ED or admitted in the last 6 months.    He presents today for Hale follow-up visit with Hale chief complaint of Hale follow-up visit. He denies fatigue, chest pain, shortness of breath, leg swelling, abdominal distention, dizziness, light-headedness, and trouble sleeping. He weighs himself every day at the gym and has been lifting weights. He does not add salt to his foods and looks at the nutrition label. He recently had Hale sleep study completed. He was able to receive his Scott Hale from the Scott [facility].  Past Medical History:  Diagnosis Date  . CHF (congestive heart failure) (Scott Hale)   . Diabetes mellitus without complication (Scott Hale)   . Erectile dysfunction   . Hypertension    Past Surgical History:  Procedure Laterality Date  . COLONOSCOPY WITH PROPOFOL N/Hale 04/09/2017   Procedure: COLONOSCOPY WITH PROPOFOL;  Surgeon: Scott Mood, Hale;  Location: [facility];  Service: Gastroenterology;  Laterality: N/Hale;  . CORONARY ARTERY BYPASS GRAFT  2003  . JOINT REPLACEMENT     Family History  Problem Relation Age of Onset  . Diabetes Mother   . Alcoholism Father   . Diabetes Sister   . Diabetes Sister   . Arrhythmia Sister   . Diabetes Sister    Social History   Tobacco Use  . Smoking status: Never Smoker  . Smokeless tobacco: Never Used  Substance Use Topics  . Alcohol use: No   No Known Allergies   Prior to Admission medications   Medication Sig Start Date End Date Taking? Authorizing Provider  ASPIRIN 81 PO Take by mouth daily.    Yes Scott Hale  atorvastatin (LIPITOR) 40 MG tablet Take  40 mg by mouth daily. 08/07/16  Yes Scott Hale  carvedilol (COREG) 6.25 MG tablet Take 6.25 mg by mouth 2 (two) times daily with Hale meal.   Yes Scott Hale  cholecalciferol (VITAMIN D) 25 MCG (1000 UT) tablet Take 1,000 Units by mouth daily.   Yes Scott Hale  Scott Hale 97-103 MG Take 1 tablet by mouth twice daily 08/21/18  Yes Scott Hale  glipiZIDE (GLUCOTROL) 10 MG tablet TAKE 1 TABLET EVERY MORNING BEFORE BREAKFAST 08/07/16  Yes Scott Hale  insulin detemir (LEVEMIR) 100 UNIT/ML injection Inject 80 Units into the skin at bedtime.  08/20/16  Yes Scott Hale  spironolactone (ALDACTONE) 25 MG tablet Take 1 tablet (25 mg total) by mouth daily. 11/18/18 02/16/19 Yes Scott Hale  vitamin B-12 (CYANOCOBALAMIN) 500 MCG tablet Take 500 mcg by mouth daily.   Yes Scott Hale  ibuprofen (ADVIL,MOTRIN) 200 MG tablet Take 200 mg by mouth every 6 (six) hours as needed.    Scott Hale  NON FORMULARY 386 mg 2 (two) times daily. Testosterone booster OTC product    Scott Hale   Review of Systems  Constitutional: Negative for appetite change and fatigue.  HENT: Negative for congestion, postnasal drip and sore throat.   Eyes: Negative.   Respiratory: Negative for cough, chest tightness and shortness of breath.  Cardiovascular: Negative for chest pain, palpitations and leg swelling.  Gastrointestinal: Negative for abdominal distention and abdominal pain.  Endocrine: Negative.   Genitourinary: Negative.   Musculoskeletal: Negative for back pain and neck pain.  Skin: Negative.   Allergic/Immunologic: Negative.   Neurological: Negative for dizziness and light-headedness.  Hematological: Negative for adenopathy. Does not bruise/bleed easily.  Psychiatric/Behavioral: Negative for dysphoric Hale and sleep disturbance. The patient is not nervous/anxious.    Vitals:   12/15/18 1249  BP: 140/85   Pulse: (!) 58  Resp: 20  SpO2: 100%   Filed Weights   12/15/18 1249  Weight: 240 lb 6.4 oz (109 kg)   Lab Results  Component Value Date   CREATININE 1.13 04/22/2018   CREATININE 1.32 (H) 09/09/2017   CREATININE 1.21 07/10/2017    Physical Exam  Constitutional: He is oriented to person, place, and time. He appears well-developed and well-nourished.  HENT:  Head: Normocephalic and atraumatic.  Neck: Normal range of motion. Neck supple. No JVD present.  Cardiovascular: Regular rhythm. Bradycardia present.  Pulmonary/Chest: Effort normal. He has no wheezes. He has no rales.  Abdominal: Soft. He exhibits no distension. There is no abdominal tenderness.  Musculoskeletal:        General: No tenderness or edema.  Neurological: He is alert and oriented to person, place, and time.  Skin: Skin is warm and dry.  Psychiatric: He has Hale normal Hale and affect. His behavior is normal. Thought content normal.  Nursing note and vitals reviewed.  Assessment & Plan:  1: Chronic heart failure with reduced ejection fraction- - NYHA class I - euvolemic today - weighing daily and he was reminded to call for an overnight weight gain of >2 pounds or Hale weekly weight gain of >5 pounds - weight down 7 pounds from last visit here 1 month ago - going to the gym and lifting weights - not adding salt to his food. Reviewed the importance of closely following Hale 2000mg  sodium diet  - BNP 03/24/17 was 185.0 - saw cardiology (Scott Hale) 07/25/17  - will check BMP today - consider adding farxiga at future visits if becomes symptomatic - unable to titrate carvedilol due to bradycardia  2: HTN- - BP slightly elevated today, however much improved from last visit.  - consider adding clonidine in the future as he was unable to afford Bidil. - saw PCP Scott Hale) 08/26/2018 - BMP on 11/17/2018 reviewed and showed sodium 135, potassium 4.1, creatinine 1.1 and GFR 81  3: Diabetes-  - A1c on 11/17/2018 was 8.2% - saw  nephrology (Scott Hale) 08/27/17 - glucose 241 this morning  4: Snoring- - had sleep study 12/12/2018, results are pending.  Medication bottles were reviewed.   Return in 3 months or sooner for amy questions/problems before then.

## 2018-12-15 NOTE — Patient Instructions (Signed)
Continue weighing daily and call for an overnight weight gain of > 2 pounds or a weekly weight gain of >5 pounds. 

## 2018-12-31 NOTE — Procedures (Signed)
[facility] [address] [place], [place] [address]  Patient Name: Scott Hale DOB: 1950-10-25   SLEEP STUDY INTERPRETATION  DATE OF SERVICE: December 10, 2018   SLEEP STUDY HISTORY: This patient is referred to the sleep lab for a baseline Polysomnography. Pertinent history includes a history of diagnosis of excessive daytime somnolence and snoring.  PROCEDURE: This overnight polysomnogram was performed using the Alice 5 acquisition system using the standard diagnostic protocol as outlined by the AASM. This includes 6 channels of EEG, 2 channelscannels of EOG, chin EMG, bilateral anterior tibialis EMG, nasal/oral thermister, PTAF, chest and abdominal wall movements, ECG and pulse oximetry. Apneas and Hypopneas were scored per AASM definition.  SLEEP ARCHITECHTURE: This is a baseline polysomnograph  study. The total recording time was 407.0 minutes and the patients total sleep time is noted to be 253.4 minutes. Sleep onset latency was 103.1 minutes and is prolonged.  Stage R sleep onset latency was 122.0 minutes. Sleep maintenance efficiency was 62.3% and is decreased.  Sleep staging expressed as a percentage of total sleep time demonstrated 8.3% N1, 49.3% N2 and 32.9% N3  sleep. Stage R represents 9.5% of total sleep time. This is decreased.  There were a total of 43 arousals  for an overall arousal index of 10.2 per hour of sleep. PLMS arousal were not noted. Arousals without respiratory events are  noted. This can contribute to sleep architechture disruption.  RESPIRATORY MONITORING:   Patient exhibits evidence of sleep disorderd breathing characterized by 19 central apneas, 23 obstructive apneas and 0 mixed apneas. There were 29 obstructive hypopneas and 0 RERAs. Most of the apneas/hypopneas were of obstructive variety. The total apnea hypopnea index (apneas and hypopneas per hour of sleep) is 16.8 respiratory events per hour and is moderate.  Respiratory monitoring  demonstrated mild snoring through the night. There are a total of 162 snoring episodes representing 1.1% of sleep.   Baseline oxygen saturation during wakefulness was 97.6% and during NREM sleep averaged 96.7% through the night. Arterial saturation during REM sleep was 96% through the night. There was significant  oxygen desaturation with the respiratory events. Arterial oxygen desaturation occurred of at least 4% was noted with a low saturation of 87%. The study was performed off oxygen.  CARDIAC MONITORING:   Average heart rate is 56 during sleep with a high of 79 beats per minute. Malignant arrhythmias were not noted.    IMPRESSIONS:  --This overnight polysomnogram demonstrates presence of moderate obstructive sleep apnea with an overall AHI 16.8 per hour. --The overall AHI was no worse  during Stage R. --There were associated significant arterial oxygen desaturations noted with a low saturation 87% --There was no significant PLMS noted in this study. --There is mild snoring noted throughout the study.    RECOMMENDATIONS:  --CPAP titration study is indicated in this study to determine the optimal response to the therapy.  Clinical correlation is recommended.  We would be happy to evaluate the patient if so desired. --Nasal decongestants and antihistamines may be of help for increased upper airways resistance when present. --Weight loss through dietary and lifestyle modification is recommended in the presence of obesity. --A search for and treatment of any underlying cardiopulmonary disease is      recommended in the presence of oxygen desaturations. --Alternative treatment options if the patient is not willing to use CPAP include oral   appliances as well as surgical intervention which may help in the appropriate patient. --Clinical correlation is recommended. Please feel free  to call the office for any further  questions or assistance in the care of this patient.     Yevonne Pax,  MD [facility] Pulmonary Critical Care Medicine Sleep medicine

## 2019-01-26 ENCOUNTER — Telehealth: Payer: Self-pay

## 2019-01-26 ENCOUNTER — Other Ambulatory Visit: Payer: Self-pay | Admitting: Family

## 2019-01-26 NOTE — Telephone Encounter (Signed)
Spoke with New Patient from [facility] sleepmed, referred by his pcp, Dr Devona Konig did receive results and if patient needs to follow up with our office to get CPAP results he has been advised to call us back. Beth

## 2019-02-02 DIAGNOSIS — K5909 Other constipation: Secondary | ICD-10-CM | POA: Diagnosis not present

## 2019-02-02 DIAGNOSIS — Z1211 Encounter for screening for malignant neoplasm of colon: Secondary | ICD-10-CM | POA: Diagnosis not present

## 2019-02-04 ENCOUNTER — Other Ambulatory Visit: Admission: RE | Admit: 2019-02-04 | Payer: Medicare HMO | Source: Ambulatory Visit

## 2019-03-04 IMAGING — CR DG CHEST 2V
1 series · 2 of 2 positions shown · non-contrast
Comparison: None.

CLINICAL DATA: Shortness of Breath

EXAM:
CHEST  2 VIEW

[Series 1: dg chest 2 view · 0.14mm/px · 2 of 2 slices shown]
[im 1/2]
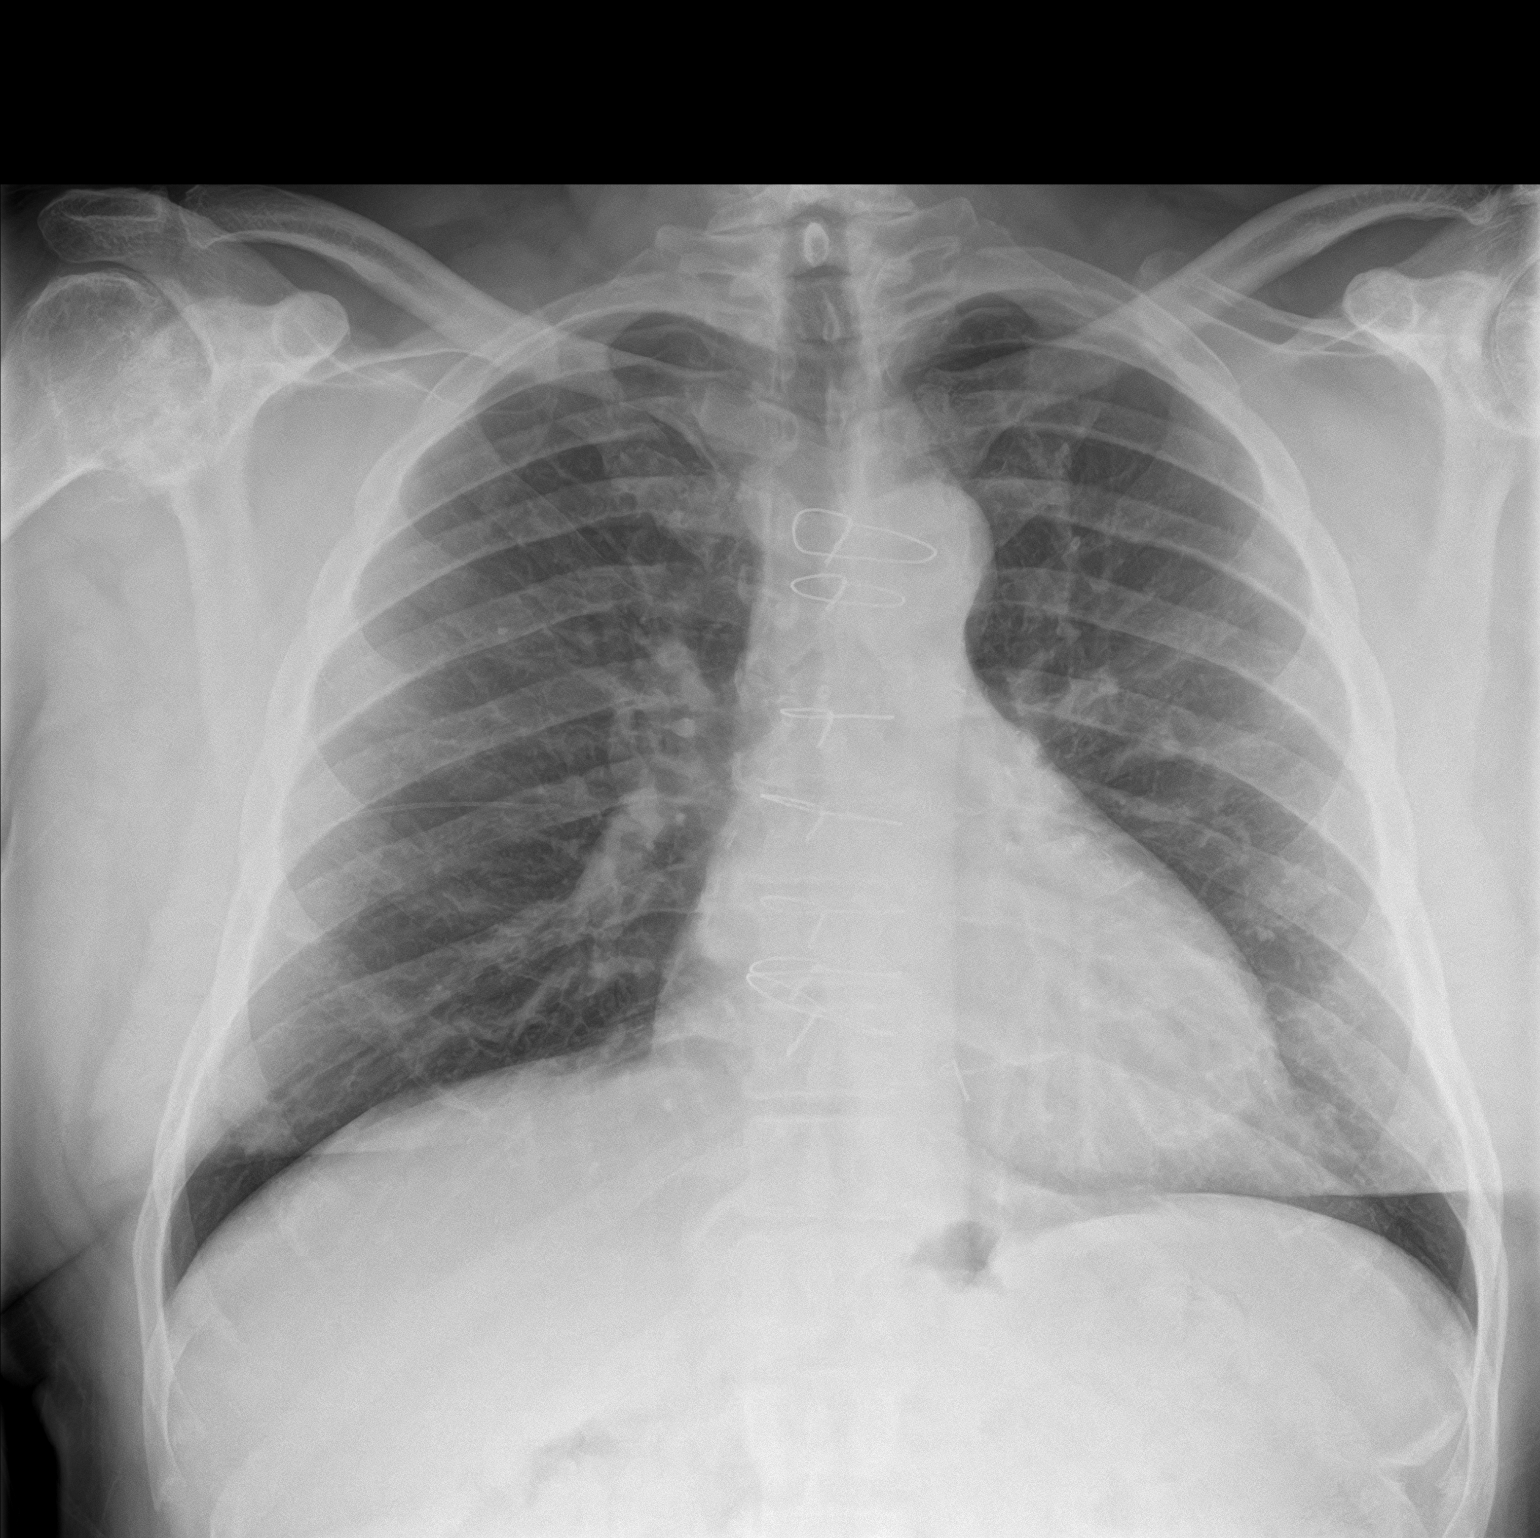
[im 2/2]
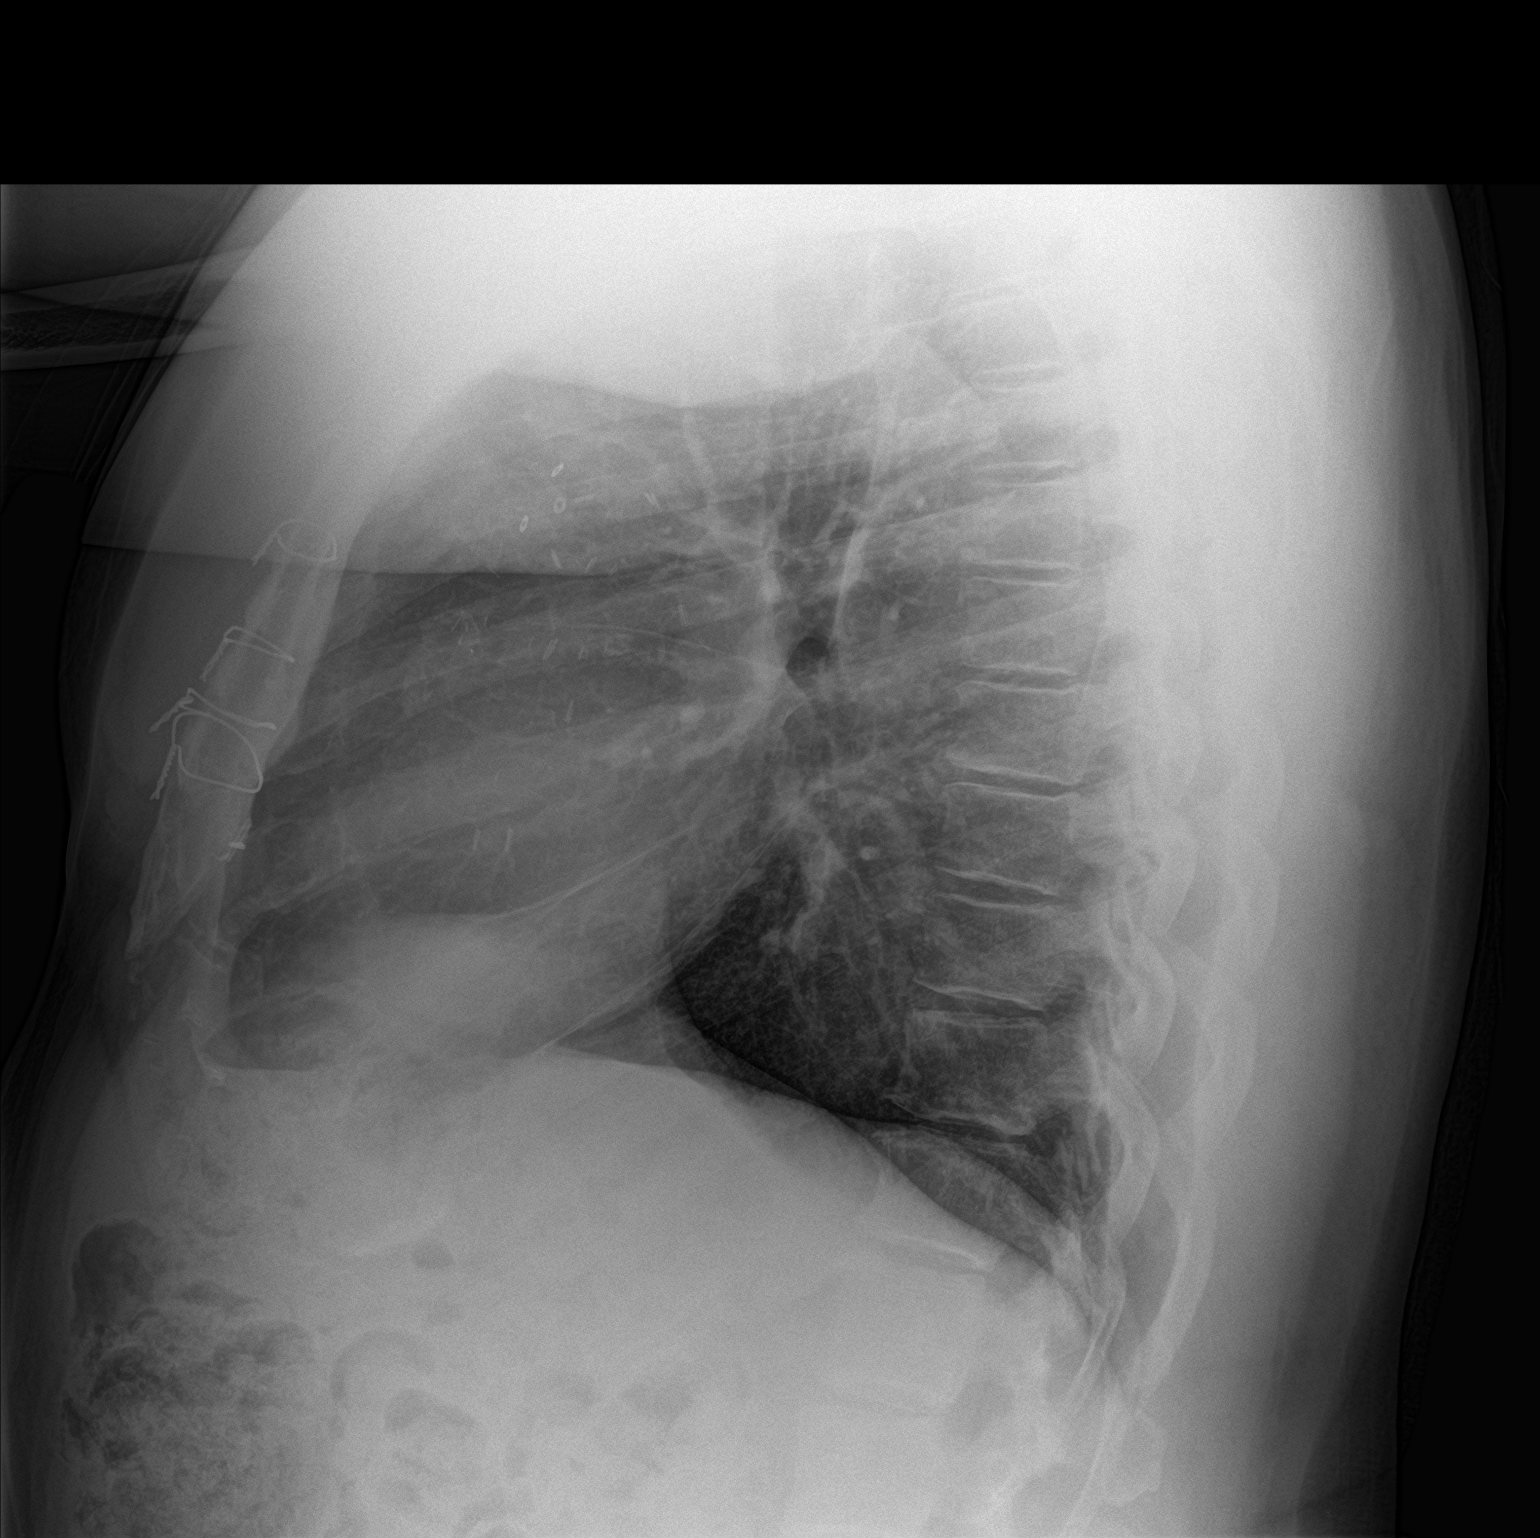

[2 of 2 positions shown; findings below may reference images not displayed]

FINDINGS: Prior CABG. Heart and mediastinal contours are within normal limits.
No focal opacities or effusions. No acute bony abnormality.
IMPRESSION: No active cardiopulmonary disease.

## 2019-03-18 NOTE — Progress Notes (Signed)
Patient ID: Scott Hale, male    DOB: 25-Dec-1950, 69 y.o.   MRN: [id number]  HPI  Mr Kawahara is a 69 y/o male with a history of diabetes, HTN and chronic heart failure.   Echo report from 04/29/17 reviewed and showed an EF of 35% along with mild MR/TR/AR. Stress test performed 04/29/17 and showed an EF of 31% along with inferolateral T wave inversions with stress.  He hasn't been in the ED or admitted in the last 6 months.    He presents today for a follow-up visit with a chief complaint of a follow-up visit. Currently without any symptoms and specifically denies any difficulty sleeping, dizziness, abdominal distention, palpitations, pedal edema, chest pain, shortness of breath, cough or fatigue. Has noticed a slight weight gain but says that he's been working out at the gym and has noticed that he's building muscle.   Past Medical History:  Diagnosis Date  . CHF (congestive heart failure) (HCC)   . Diabetes mellitus without complication (H[facility])   . Erectile dysfunction   . Hypertension    Past Surgical History:  Procedure Laterality Date  . COLONOSCOPY WITH PROPOFOL N/A 04/09/2017   Procedure: COLONOSCOPY WITH PROPOFOL;  Surgeon: Wyline Mood, MD;  Location: [facility] ENDOSCOPY;  Service: Gastroenterology;  Laterality: N/A;  . CORONARY ARTERY BYPASS GRAFT  2003  . JOINT REPLACEMENT     Family History  Problem Relation Age of Onset  . Diabetes Mother   . Alcoholism Father   . Diabetes Sister   . Diabetes Sister   . Arrhythmia Sister   . Diabetes Sister    Social History   Tobacco Use  . Smoking status: Never Smoker  . Smokeless tobacco: Never Used  Substance Use Topics  . Alcohol use: No   No Known Allergies   Prior to Admission medications   Medication Sig Start Date End Date Taking? Authorizing Provider  ASPIRIN 81 PO Take by mouth daily.    Yes [provider]  atorvastatin (LIPITOR) 40 MG tablet Take 40 mg by mouth daily. 08/07/16  Yes [provider]   carvedilol (COREG) 6.25 MG tablet Take 6.25 mg by mouth 2 (two) times daily with a meal.   Yes [provider]  cholecalciferol (VITAMIN D) 25 MCG (1000 UT) tablet Take 1,000 Units by mouth daily.   Yes [provider]  ENTRESTO 97-103 MG Take 1 tablet by mouth twice daily 08/21/18  Yes Wileen Duncanson A, FNP  glipiZIDE (GLUCOTROL) 10 MG tablet TAKE 1 TABLET EVERY MORNING BEFORE BREAKFAST 08/07/16  Yes [provider]  ibuprofen (ADVIL,MOTRIN) 200 MG tablet Take 200 mg by mouth every 6 (six) hours as needed.   Yes [provider]  insulin detemir (LEVEMIR) 100 UNIT/ML injection Inject 80 Units into the skin at bedtime.  08/20/16  Yes [provider]  NON FORMULARY 386 mg 2 (two) times daily. Testosterone booster OTC product   Yes [provider]  spironolactone (ALDACTONE) 25 MG tablet Take 1 tablet (25 mg total) by mouth daily. 11/18/18 03/19/19 Yes Ashden Sonnenberg, Jarold Song, FNP  vitamin B-12 (CYANOCOBALAMIN) 500 MCG tablet Take 500 mcg by mouth daily.   Yes [provider]    Review of Systems  Constitutional: Negative for appetite change and fatigue.  HENT: Negative for congestion, postnasal drip and sore throat.   Eyes: Negative.   Respiratory: Negative for cough, chest tightness and shortness of breath.   Cardiovascular: Negative for chest pain, palpitations and leg swelling.  Gastrointestinal:  Negative for abdominal distention and abdominal pain.  Endocrine: Negative.   Genitourinary: Negative.   Musculoskeletal: Negative for back pain and neck pain.  Skin: Negative.   Allergic/Immunologic: Negative.   Neurological: Negative for dizziness and light-headedness.  Hematological: Negative for adenopathy. Does not bruise/bleed easily.  Psychiatric/Behavioral: Negative for dysphoric mood and sleep disturbance. The patient is not nervous/anxious.    Vitals:   03/19/19 1241  BP: 135/77  Pulse: 67  Resp: 20  SpO2: 100%  Weight: 244 lb 3.2 oz  (110.8 kg)  Height: 6' (1.829 m)   Wt Readings from Last 3 Encounters:  03/19/19 244 lb 3.2 oz (110.8 kg)  12/15/18 240 lb 6.4 oz (109 kg)  11/18/18 247 lb 2 oz (112.1 kg)   Lab Results  Component Value Date   CREATININE 1.14 12/15/2018   CREATININE 1.13 04/22/2018   CREATININE 1.32 (H) 09/09/2017    Physical Exam  Constitutional: He is oriented to person, place, and time. He appears well-developed and well-nourished.  HENT:  Head: Normocephalic and atraumatic.  Neck: No JVD present.  Cardiovascular: Normal rate and regular rhythm.  Pulmonary/Chest: Effort normal. He has no wheezes. He has no rales.  Abdominal: Soft. He exhibits no distension. There is no abdominal tenderness.  Musculoskeletal:        General: No tenderness or edema.     Cervical back: Normal range of motion and neck supple.  Neurological: He is alert and oriented to person, place, and time.  Skin: Skin is warm and dry.  Psychiatric: He has a normal mood and affect. His behavior is normal. Thought content normal.  Nursing note and vitals reviewed.  Assessment & Plan:  1: Chronic heart failure with reduced ejection fraction- - NYHA class I - euvolemic today - weighing daily and he was reminded to call for an overnight weight gain of >2 pounds or a weekly weight gain of >5 pounds - weight up 4 pounds from last visit here 3 months ago - going to the gym and lifting weights - not adding salt to his food. Reviewed the importance of closely following a 2000mg  sodium diet  - BNP 03/24/17 was 185.0 - saw cardiology (Fath) 07/25/17  - needs updated echo and this will be scheduled - consider adding farxiga at future visits if becomes symptomatic - if HR remains normal, could titrate up carvedilol  2: HTN- - BP looks good today - saw PCP Kary Kos) 11/24/2018 - BMP on 12/15/2018 reviewed and showed sodium 138, potassium 4.3, creatinine 1.14 and GFR >60  3: Diabetes-  - A1c on 11/17/2018 was 8.2% - saw nephrology  ([place]) 08/27/17 - glucose 141 at home this morning   Medication bottles were reviewed.   Return in 6 months or sooner for any questions/problems before then.

## 2019-03-19 ENCOUNTER — Encounter: Payer: Self-pay | Admitting: Family

## 2019-03-19 ENCOUNTER — Other Ambulatory Visit: Payer: Self-pay

## 2019-03-19 ENCOUNTER — Ambulatory Visit: Payer: Medicare HMO | Attending: Family | Admitting: Family

## 2019-03-19 VITALS — BP 135/77 | HR 67 | Resp 20 | Ht 72.0 in | Wt 244.2 lb

## 2019-03-19 DIAGNOSIS — Z79899 Other long term (current) drug therapy: Secondary | ICD-10-CM | POA: Diagnosis not present

## 2019-03-19 DIAGNOSIS — I5022 Chronic systolic (congestive) heart failure: Secondary | ICD-10-CM | POA: Diagnosis not present

## 2019-03-19 DIAGNOSIS — Z7984 Long term (current) use of oral hypoglycemic drugs: Secondary | ICD-10-CM | POA: Diagnosis not present

## 2019-03-19 DIAGNOSIS — Z7982 Long term (current) use of aspirin: Secondary | ICD-10-CM | POA: Diagnosis not present

## 2019-03-19 DIAGNOSIS — E119 Type 2 diabetes mellitus without complications: Secondary | ICD-10-CM | POA: Insufficient documentation

## 2019-03-19 DIAGNOSIS — I10 Essential (primary) hypertension: Secondary | ICD-10-CM

## 2019-03-19 DIAGNOSIS — I110 Hypertensive heart disease with heart failure: Secondary | ICD-10-CM | POA: Diagnosis not present

## 2019-03-19 NOTE — Patient Instructions (Signed)
Continue weighing daily and call for an overnight weight gain of > 2 pounds or a weekly weight gain of >5 pounds. 

## 2019-03-26 DIAGNOSIS — Z794 Long term (current) use of insulin: Secondary | ICD-10-CM | POA: Diagnosis not present

## 2019-03-26 DIAGNOSIS — E1165 Type 2 diabetes mellitus with hyperglycemia: Secondary | ICD-10-CM | POA: Diagnosis not present

## 2019-04-16 DIAGNOSIS — E1165 Type 2 diabetes mellitus with hyperglycemia: Secondary | ICD-10-CM | POA: Diagnosis not present

## 2019-04-16 DIAGNOSIS — N529 Male erectile dysfunction, unspecified: Secondary | ICD-10-CM | POA: Diagnosis not present

## 2019-04-16 DIAGNOSIS — Z794 Long term (current) use of insulin: Secondary | ICD-10-CM | POA: Diagnosis not present

## 2019-04-16 DIAGNOSIS — I509 Heart failure, unspecified: Secondary | ICD-10-CM | POA: Diagnosis not present

## 2019-04-16 DIAGNOSIS — N289 Disorder of kidney and ureter, unspecified: Secondary | ICD-10-CM | POA: Diagnosis not present

## 2019-05-18 ENCOUNTER — Other Ambulatory Visit: Payer: Medicare HMO | Attending: Internal Medicine

## 2019-05-19 ENCOUNTER — Other Ambulatory Visit: Payer: Self-pay | Admitting: Family

## 2019-05-19 MED ORDER — SPIRONOLACTONE 25 MG PO TABS: 25.0000 mg | ORAL_TABLET | Freq: Every day | ORAL | 5 refills | Status: DC

## 2019-05-20 ENCOUNTER — Encounter: Admission: RE | Payer: Self-pay | Source: Home / Self Care

## 2019-05-20 ENCOUNTER — Ambulatory Visit: Admission: RE | Admit: 2019-05-20 | Payer: Medicare HMO | Source: Home / Self Care | Admitting: Internal Medicine

## 2019-05-20 SURGERY — COLONOSCOPY WITH PROPOFOL
Anesthesia: General

## 2019-06-02 DIAGNOSIS — Z794 Long term (current) use of insulin: Secondary | ICD-10-CM | POA: Diagnosis not present

## 2019-06-02 DIAGNOSIS — E1165 Type 2 diabetes mellitus with hyperglycemia: Secondary | ICD-10-CM | POA: Diagnosis not present

## 2019-06-02 DIAGNOSIS — E669 Obesity, unspecified: Secondary | ICD-10-CM | POA: Diagnosis not present

## 2019-06-02 DIAGNOSIS — G4733 Obstructive sleep apnea (adult) (pediatric): Secondary | ICD-10-CM | POA: Diagnosis not present

## 2019-06-02 DIAGNOSIS — E1169 Type 2 diabetes mellitus with other specified complication: Secondary | ICD-10-CM | POA: Diagnosis not present

## 2019-06-02 DIAGNOSIS — E1159 Type 2 diabetes mellitus with other circulatory complications: Secondary | ICD-10-CM | POA: Diagnosis not present

## 2019-07-21 DIAGNOSIS — Z794 Long term (current) use of insulin: Secondary | ICD-10-CM | POA: Diagnosis not present

## 2019-07-21 DIAGNOSIS — E1159 Type 2 diabetes mellitus with other circulatory complications: Secondary | ICD-10-CM | POA: Diagnosis not present

## 2019-07-21 DIAGNOSIS — E1165 Type 2 diabetes mellitus with hyperglycemia: Secondary | ICD-10-CM | POA: Diagnosis not present

## 2019-09-17 ENCOUNTER — Ambulatory Visit: Payer: Medicare HMO | Attending: Family | Admitting: Family

## 2019-09-17 ENCOUNTER — Encounter: Payer: Self-pay | Admitting: Family

## 2019-09-17 ENCOUNTER — Other Ambulatory Visit: Payer: Self-pay

## 2019-09-17 VITALS — BP 135/78 | HR 69 | Resp 20 | Ht 72.0 in | Wt 234.0 lb

## 2019-09-17 DIAGNOSIS — Z951 Presence of aortocoronary bypass graft: Secondary | ICD-10-CM | POA: Insufficient documentation

## 2019-09-17 DIAGNOSIS — I10 Essential (primary) hypertension: Secondary | ICD-10-CM

## 2019-09-17 DIAGNOSIS — Z833 Family history of diabetes mellitus: Secondary | ICD-10-CM | POA: Diagnosis not present

## 2019-09-17 DIAGNOSIS — Z791 Long term (current) use of non-steroidal anti-inflammatories (NSAID): Secondary | ICD-10-CM | POA: Insufficient documentation

## 2019-09-17 DIAGNOSIS — E119 Type 2 diabetes mellitus without complications: Secondary | ICD-10-CM | POA: Insufficient documentation

## 2019-09-17 DIAGNOSIS — I5022 Chronic systolic (congestive) heart failure: Secondary | ICD-10-CM | POA: Diagnosis not present

## 2019-09-17 DIAGNOSIS — I110 Hypertensive heart disease with heart failure: Secondary | ICD-10-CM | POA: Insufficient documentation

## 2019-09-17 DIAGNOSIS — Z7982 Long term (current) use of aspirin: Secondary | ICD-10-CM | POA: Insufficient documentation

## 2019-09-17 DIAGNOSIS — Z79899 Other long term (current) drug therapy: Secondary | ICD-10-CM | POA: Diagnosis not present

## 2019-09-17 DIAGNOSIS — Z794 Long term (current) use of insulin: Secondary | ICD-10-CM | POA: Diagnosis not present

## 2019-09-17 NOTE — Progress Notes (Signed)
Patient ID: Scott Hale, male    DOB: [birth year], 69 y.o.   MRN: [id number]  HPI  Scott Hale is a 69 y/o male with a history of diabetes, HTN and chronic heart failure.   Echo report from 04/29/17 reviewed and showed an EF of 35% along with mild Scott/TR/AR. Stress test performed 04/29/17 and showed an EF of 31% along with inferolateral T wave inversions with stress.  He hasn't been in the ED or admitted in the last 6 months.    He presents today with a chief complaint of a follow-up visit. He currently doesn't have any symptoms and specifically denies any difficulty sleeping, dizziness, abdominal distention, palpitations, pedal edema, chest pain, shortness of breath, cough, fatigue or weight gain. Has been push mowing a yard every week along with going to the gym. Unable to afford his copay at cardiology office.   Has been started on ozempic which he thinks has also contributed to his weight loss but says that he will be unable to afford the copay for the medication.   Past Medical History:  Diagnosis Date  . CHF (congestive heart failure) (HCC)   . Diabetes mellitus without complication (HCC)   . Erectile dysfunction   . Hypertension    Past Surgical History:  Procedure Laterality Date  . COLONOSCOPY WITH PROPOFOL N/A 04/09/2017   Procedure: COLONOSCOPY WITH PROPOFOL;  Surgeon: Wyline Mood, MD;  Location: [facility] ENDOSCOPY;  Service: Gastroenterology;  Laterality: N/A;  . CORONARY ARTERY BYPASS GRAFT  2003  . JOINT REPLACEMENT     Family History  Problem Relation Age of Onset  . Diabetes Mother   . Alcoholism Father   . Diabetes Sister   . Diabetes Sister   . Arrhythmia Sister   . Diabetes Sister    Social History   Tobacco Use  . Smoking status: Never Smoker  . Smokeless tobacco: Never Used  Substance Use Topics  . Alcohol use: No   No Known Allergies   Prior to Admission medications   Medication Sig Start Date End Date Taking? Authorizing Provider  ASPIRIN 81 PO Take by  mouth daily.    Yes [provider]  atorvastatin (LIPITOR) 40 MG tablet Take 40 mg by mouth daily. 08/07/16  Yes [provider]  carvedilol (COREG) 6.25 MG tablet Take 6.25 mg by mouth 2 (two) times daily with a meal.   Yes [provider]  cholecalciferol (VITAMIN D) 25 MCG (1000 UT) tablet Take 1,000 Units by mouth daily.   Yes [provider]  ENTRESTO 97-103 MG Take 1 tablet by mouth twice daily 08/21/18  Yes Avarose Mervine A, FNP  glipiZIDE (GLUCOTROL) 10 MG tablet TAKE 1 TABLET EVERY MORNING BEFORE BREAKFAST 08/07/16  Yes [provider]  ibuprofen (ADVIL,MOTRIN) 200 MG tablet Take 200 mg by mouth every 6 (six) hours as needed.   Yes [provider]  insulin detemir (LEVEMIR) 100 UNIT/ML injection Inject 80 Units into the skin at bedtime.  08/20/16  Yes [provider]  NON FORMULARY 386 mg 2 (two) times daily. Testosterone booster OTC product   Yes [provider]  Semaglutide,0.25 or 0.5MG /DOS, (OZEMPIC, 0.25 OR 0.5 MG/DOSE,) 2 MG/1.5ML SOPN Inject into the skin once a week.   Yes [provider]  tadalafil (CIALIS) 5 MG tablet Take 5 mg by mouth daily as needed for erectile dysfunction.   Yes [provider]  vitamin B-12 (CYANOCOBALAMIN) 500 MCG tablet Take 500 mcg by mouth daily.   Yes  [provider]  spironolactone (ALDACTONE) 25 MG tablet Take 1 tablet (25 mg total) by mouth daily. 05/19/19 08/17/19  Delma Freeze, FNP     Review of Systems  Constitutional: Negative for appetite change and fatigue.  HENT: Negative for congestion, postnasal drip and sore throat.   Eyes: Negative.   Respiratory: Negative for cough, chest tightness and shortness of breath.   Cardiovascular: Negative for chest pain, palpitations and leg swelling.  Gastrointestinal: Negative for abdominal distention and abdominal pain.  Endocrine: Negative.   Genitourinary: Negative.   Musculoskeletal: Negative for back pain  and neck pain.  Skin: Negative.   Allergic/Immunologic: Negative.   Neurological: Negative for dizziness and light-headedness.  Hematological: Negative for adenopathy. Does not bruise/bleed easily.  Psychiatric/Behavioral: Negative for dysphoric mood and sleep disturbance. The patient is not nervous/anxious.    Vitals:   09/17/19 1249  BP: 135/78  Pulse: 69  Resp: 20  SpO2: 100%  Weight: 234 lb (106.1 kg)  Height: 6' (1.829 m)   Wt Readings from Last 3 Encounters:  09/17/19 234 lb (106.1 kg)  03/19/19 244 lb 3.2 oz (110.8 kg)  12/15/18 240 lb 6.4 oz (109 kg)   Lab Results  Component Value Date   CREATININE 1.14 12/15/2018   CREATININE 1.13 04/22/2018   CREATININE 1.32 (H) 09/09/2017    Physical Exam Vitals and nursing note reviewed.  Constitutional:      Appearance: He is well-developed.  HENT:     Head: Normocephalic and atraumatic.  Neck:     Vascular: No JVD.  Cardiovascular:     Rate and Rhythm: Normal rate and regular rhythm.  Pulmonary:     Effort: Pulmonary effort is normal.     Breath sounds: No wheezing or rales.  Abdominal:     General: There is no distension.     Palpations: Abdomen is soft.     Tenderness: There is no abdominal tenderness.  Musculoskeletal:        General: No tenderness.     Cervical back: Normal range of motion and neck supple.  Skin:    General: Skin is warm and dry.  Neurological:     Mental Status: He is alert and oriented to person, place, and time.  Psychiatric:        Behavior: Behavior normal.        Thought Content: Thought content normal.    Assessment & Plan:  1: Chronic heart failure with reduced ejection fraction- - NYHA class I - euvolemic today - weighing daily and he was reminded to call for an overnight weight gain of >2 pounds or a weekly weight gain of >5 pounds - weight down 10 pounds from last visit here 6 months ago - going to the gym and lifting weights along with push mowing a yard - not adding salt  to his food. Reviewed the importance of closely following a 2000mg  sodium diet  - saw cardiology (Fath) 07/25/17 & is unable to afford copay to return; patient currently asymptomatic - echo has been scheduled for 03/22/20 for when his f/u appointment here is scheduled - titrate carvedilol in the future if HR allows - BNP 03/24/17 was 185.0  2: HTN- - BP looks good today - saw PCP 05/22/17) 04/16/19 - BMP on 06/02/19 reviewed and showed sodium 136, potassium 4.9, creatinine 1.2 and GFR 73  3: Diabetes-  - A1c on 07/21/19 was 7.8% - saw nephrology ([place]) 08/27/17 - glucose at home today was 77 - saw endocrinology (  [facility]) 07/21/19; office number wrote on AVS and instructed him to call their office and tell them that he can't afford the ozempic   Medication bottles were reviewed.   Return in 6 months or sooner for any questions/problems before then.

## 2019-09-17 NOTE — Patient Instructions (Addendum)
Continue weighing daily and call for an overnight weight gain of > 2 pounds or a weekly weight gain of >5 pounds.  Call Dr. Pricilla Handler office and tell them you can't afford the ozempic. Phone: [phone]

## 2019-10-27 DIAGNOSIS — E1159 Type 2 diabetes mellitus with other circulatory complications: Secondary | ICD-10-CM | POA: Diagnosis not present

## 2019-10-27 DIAGNOSIS — Z794 Long term (current) use of insulin: Secondary | ICD-10-CM | POA: Diagnosis not present

## 2019-11-09 DIAGNOSIS — I25810 Atherosclerosis of coronary artery bypass graft(s) without angina pectoris: Secondary | ICD-10-CM | POA: Diagnosis not present

## 2019-11-09 DIAGNOSIS — I5020 Unspecified systolic (congestive) heart failure: Secondary | ICD-10-CM | POA: Diagnosis not present

## 2019-11-09 DIAGNOSIS — Z6831 Body mass index (BMI) 31.0-31.9, adult: Secondary | ICD-10-CM | POA: Diagnosis not present

## 2019-11-09 DIAGNOSIS — E119 Type 2 diabetes mellitus without complications: Secondary | ICD-10-CM | POA: Diagnosis not present

## 2019-11-09 DIAGNOSIS — Z794 Long term (current) use of insulin: Secondary | ICD-10-CM | POA: Diagnosis not present

## 2019-11-09 DIAGNOSIS — E6609 Other obesity due to excess calories: Secondary | ICD-10-CM | POA: Diagnosis not present

## 2019-11-09 DIAGNOSIS — I255 Ischemic cardiomyopathy: Secondary | ICD-10-CM | POA: Diagnosis not present

## 2019-12-16 DIAGNOSIS — M5432 Sciatica, left side: Secondary | ICD-10-CM | POA: Diagnosis not present

## 2020-02-11 ENCOUNTER — Telehealth: Payer: Self-pay | Admitting: Family

## 2020-02-11 NOTE — Telephone Encounter (Signed)
LVM for patient to come in and sign his [employer] patient assistance application to get it renewed.   Meliana Canner, NT

## 2020-03-01 DIAGNOSIS — I5020 Unspecified systolic (congestive) heart failure: Secondary | ICD-10-CM | POA: Diagnosis not present

## 2020-03-01 DIAGNOSIS — Z0000 Encounter for general adult medical examination without abnormal findings: Secondary | ICD-10-CM | POA: Diagnosis not present

## 2020-03-01 DIAGNOSIS — Z9852 Vasectomy status: Secondary | ICD-10-CM | POA: Diagnosis not present

## 2020-03-01 DIAGNOSIS — R7989 Other specified abnormal findings of blood chemistry: Secondary | ICD-10-CM | POA: Diagnosis not present

## 2020-03-01 DIAGNOSIS — E785 Hyperlipidemia, unspecified: Secondary | ICD-10-CM | POA: Diagnosis not present

## 2020-03-01 DIAGNOSIS — E1159 Type 2 diabetes mellitus with other circulatory complications: Secondary | ICD-10-CM | POA: Diagnosis not present

## 2020-03-01 DIAGNOSIS — I25810 Atherosclerosis of coronary artery bypass graft(s) without angina pectoris: Secondary | ICD-10-CM | POA: Diagnosis not present

## 2020-03-01 DIAGNOSIS — Z125 Encounter for screening for malignant neoplasm of prostate: Secondary | ICD-10-CM | POA: Diagnosis not present

## 2020-03-07 ENCOUNTER — Telehealth: Payer: Self-pay | Admitting: Family

## 2020-03-07 NOTE — Telephone Encounter (Signed)
LVM with patient to let him know his [employer] patient assistance application was approved for Entresto till end of year.    Gricelda Foland, NT

## 2020-03-22 ENCOUNTER — Ambulatory Visit: Payer: Medicare HMO | Admitting: Family

## 2020-03-22 ENCOUNTER — Ambulatory Visit: Payer: Medicare HMO

## 2020-03-22 DIAGNOSIS — Z1389 Encounter for screening for other disorder: Secondary | ICD-10-CM | POA: Diagnosis not present

## 2020-03-22 DIAGNOSIS — Z0000 Encounter for general adult medical examination without abnormal findings: Secondary | ICD-10-CM | POA: Diagnosis not present

## 2020-03-22 DIAGNOSIS — I10 Essential (primary) hypertension: Secondary | ICD-10-CM | POA: Diagnosis not present

## 2020-03-22 DIAGNOSIS — E785 Hyperlipidemia, unspecified: Secondary | ICD-10-CM | POA: Diagnosis not present

## 2020-03-22 DIAGNOSIS — N182 Chronic kidney disease, stage 2 (mild): Secondary | ICD-10-CM | POA: Diagnosis not present

## 2020-03-22 DIAGNOSIS — E119 Type 2 diabetes mellitus without complications: Secondary | ICD-10-CM | POA: Diagnosis not present

## 2020-03-31 ENCOUNTER — Encounter: Payer: Self-pay | Admitting: Family

## 2020-03-31 ENCOUNTER — Other Ambulatory Visit: Payer: Self-pay

## 2020-03-31 ENCOUNTER — Ambulatory Visit
Admission: RE | Admit: 2020-03-31 | Discharge: 2020-03-31 | Disposition: A | Discharge status: Home / Self Care | Payer: Medicare HMO | Source: Ambulatory Visit | Attending: Family | Admitting: Family

## 2020-03-31 ENCOUNTER — Ambulatory Visit: Payer: Medicare HMO | Admitting: Family

## 2020-03-31 VITALS — BP 151/78 | HR 54 | Resp 20 | Ht 72.0 in | Wt 246.1 lb

## 2020-03-31 DIAGNOSIS — E119 Type 2 diabetes mellitus without complications: Secondary | ICD-10-CM

## 2020-03-31 DIAGNOSIS — Z794 Long term (current) use of insulin: Secondary | ICD-10-CM | POA: Diagnosis not present

## 2020-03-31 DIAGNOSIS — I110 Hypertensive heart disease with heart failure: Secondary | ICD-10-CM | POA: Diagnosis not present

## 2020-03-31 DIAGNOSIS — Z7901 Long term (current) use of anticoagulants: Secondary | ICD-10-CM | POA: Diagnosis not present

## 2020-03-31 DIAGNOSIS — I5042 Chronic combined systolic (congestive) and diastolic (congestive) heart failure: Secondary | ICD-10-CM

## 2020-03-31 DIAGNOSIS — I5022 Chronic systolic (congestive) heart failure: Secondary | ICD-10-CM | POA: Diagnosis not present

## 2020-03-31 DIAGNOSIS — Z7982 Long term (current) use of aspirin: Secondary | ICD-10-CM | POA: Diagnosis not present

## 2020-03-31 DIAGNOSIS — I10 Essential (primary) hypertension: Secondary | ICD-10-CM

## 2020-03-31 LAB — ECHOCARDIOGRAM COMPLETE
AR max vel: 1.78 cm2
AV Area VTI: 1.9 cm2
AV Area mean vel: 1.85 cm2
AV Mean grad: 3.3 mmHg
AV Peak grad: 6.2 mmHg
Ao pk vel: 1.24 m/s
Area-P 1/2: 2.17 cm2
S' Lateral: 2.94 cm

## 2020-03-31 NOTE — Progress Notes (Signed)
*  PRELIMINARY RESULTS* Echocardiogram 2D Echocardiogram has been performed.  Cristela Blue 03/31/2020, 11:43 AM

## 2020-03-31 NOTE — Patient Instructions (Signed)
Continue weighing daily and call for an overnight weight gain of > 2 pounds or a weekly weight gain of >5 pounds. 

## 2020-03-31 NOTE — Progress Notes (Signed)
Patient ID: Scott Hale, male    DOB: August 15, 1950, 70 y.o.   MRN: [id number]  HPI  Mr Schack is a 70 y/o male with a history of diabetes, HTN and chronic heart failure.   Echo report from 03/31/20 reviewed and showed an EF of 55-60% along with mild MR. Echo report from 04/29/17 reviewed and showed an EF of 35% along with mild MR/TR/AR. Stress test performed 04/29/17 and showed an EF of 31% along with inferolateral T wave inversions with stress.  He hasn't been in the ED or admitted in the last 6 months.    He presents today with a chief complaint of a follow-up visit. He has noted a gradual weight gain but he says that he's resumed power lifting weights every day of the week. He has no other symptoms and specifically denies any difficulty sleeping, abdominal distention, palpitations, pedal edema, chest pain, shortness of breath, cough, dizziness or fatigue.   Past Medical History:  Diagnosis Date  . CHF (congestive heart failure) (HCC)   . Diabetes mellitus without complication (HCC)   . Erectile dysfunction   . Hypertension    Past Surgical History:  Procedure Laterality Date  . COLONOSCOPY WITH PROPOFOL N/A 04/09/2017   Procedure: COLONOSCOPY WITH PROPOFOL;  Surgeon: Wyline Mood, MD;  Location: [facility] ENDOSCOPY;  Service: Gastroenterology;  Laterality: N/A;  . CORONARY ARTERY BYPASS GRAFT  2003  . JOINT REPLACEMENT     Family History  Problem Relation Age of Onset  . Diabetes Mother   . Alcoholism Father   . Diabetes Sister   . Diabetes Sister   . Arrhythmia Sister   . Diabetes Sister    Social History   Tobacco Use  . Smoking status: Never Smoker  . Smokeless tobacco: Never Used  Substance Use Topics  . Alcohol use: No   No Known Allergies   Prior to Admission medications   Medication Sig Start Date End Date Taking? Authorizing Provider  ASPIRIN 81 PO Take by mouth daily.    Yes [provider]  atorvastatin (LIPITOR) 40 MG tablet Take 40 mg by mouth daily.  08/07/16  Yes [provider]  carvedilol (COREG) 6.25 MG tablet Take 6.25 mg by mouth 2 (two) times daily with a meal.   Yes [provider]  cholecalciferol (VITAMIN D) 25 MCG (1000 UT) tablet Take 1,000 Units by mouth daily.   Yes [provider]  ENTRESTO 97-103 MG Take 1 tablet by mouth twice daily 08/21/18  Yes Clarisa Kindred A, FNP  ibuprofen (ADVIL,MOTRIN) 200 MG tablet Take 200 mg by mouth every 6 (six) hours as needed.   Yes [provider]  insulin detemir (LEVEMIR) 100 UNIT/ML injection Inject 80 Units into the skin at bedtime.  08/20/16  Yes [provider]  NON FORMULARY 386 mg 2 (two) times daily. Testosterone booster OTC product   Yes [provider]  spironolactone (ALDACTONE) 25 MG tablet Take 1 tablet (25 mg total) by mouth daily. 05/19/19 08/17/19 Yes Susan Bleich, Inetta Fermo A, FNP  tadalafil (CIALIS) 5 MG tablet Take 5 mg by mouth daily as needed for erectile dysfunction.   Yes [provider]  vitamin B-12 (CYANOCOBALAMIN) 500 MCG tablet Take 500 mcg by mouth daily.   Yes [provider]  Dulaglutide 1.5 MG/0.5ML SOPN Inject into the skin. Patient not taking: Reported on 03/31/2020 09/17/19   [provider]    Review of Systems  Constitutional: Negative for appetite change and fatigue.  HENT: Negative for  congestion, postnasal drip and sore throat.   Eyes: Negative.   Respiratory: Negative for cough, chest tightness and shortness of breath.   Cardiovascular: Negative for chest pain, palpitations and leg swelling.  Gastrointestinal: Negative for abdominal distention and abdominal pain.  Endocrine: Negative.   Genitourinary: Negative.   Musculoskeletal: Negative for back pain and neck pain.  Skin: Negative.   Allergic/Immunologic: Negative.   Neurological: Negative for dizziness and light-headedness.  Hematological: Negative for adenopathy. Does not bruise/bleed easily.  Psychiatric/Behavioral: Negative for  dysphoric mood and sleep disturbance. The patient is not nervous/anxious.    Vitals:   03/31/20 1147  BP: (!) 151/78  Pulse: (!) 54  Resp: 20  SpO2: 100%  Weight: 246 lb 2 oz (111.6 kg)  Height: 6' (1.829 m)   Wt Readings from Last 3 Encounters:  03/31/20 246 lb 2 oz (111.6 kg)  09/17/19 234 lb (106.1 kg)  03/19/19 244 lb 3.2 oz (110.8 kg)   Lab Results  Component Value Date   CREATININE 1.14 12/15/2018   CREATININE 1.13 04/22/2018   CREATININE 1.32 (H) 09/09/2017    Physical Exam Vitals and nursing note reviewed.  Constitutional:      Appearance: He is well-developed.  HENT:     Head: Normocephalic and atraumatic.  Neck:     Vascular: No JVD.  Cardiovascular:     Rate and Rhythm: Regular rhythm. Bradycardia present.  Pulmonary:     Effort: Pulmonary effort is normal.     Breath sounds: No wheezing or rales.  Abdominal:     General: There is no distension.     Palpations: Abdomen is soft.     Tenderness: There is no abdominal tenderness.  Musculoskeletal:        General: No tenderness.     Cervical back: Normal range of motion and neck supple.  Skin:    General: Skin is warm and dry.  Neurological:     Mental Status: He is alert and oriented to person, place, and time.  Psychiatric:        Behavior: Behavior normal.        Thought Content: Thought content normal.    Assessment & Plan:  1: Chronic heart failure with now preserved ejection fraction- - NYHA class I - euvolemic today - weighing daily and he was reminded to call for an overnight weight gain of >2 pounds or a weekly weight gain of >5 pounds - weight up 12 pounds from last visit here 6 months ago - going to the gym and power lifting weights daily at [employer] - not adding salt to his food. Reviewed the importance of closely following a 2000mg  sodium diet  - saw cardiology ) 11/09/19 - echo completed earlier today; results obtained and EF has dramatically improved - BNP 03/24/17 was  185.0  2: HTN- - BP mildly elevated today - saw PCP 05/22/17) 03/01/20 - BMP on 03/01/20 reviewed and showed sodium 137, potassium 4.3, creatinine 1.3 and GFR 67  3: Diabetes-  - A1c on 10/27/19 was 6.6% - saw nephrology ([facility]) 10/27/19 - glucose at home today was 122   Patient did not bring his medications nor a list. Each medication was verbally reviewed with the patient and he was encouraged to bring the bottles to every visit to confirm accuracy of list.  Return in 6 months or sooner for any questions/problems before then.

## 2020-06-01 ENCOUNTER — Other Ambulatory Visit: Payer: Self-pay | Admitting: Family

## 2020-06-01 MED ORDER — SPIRONOLACTONE 25 MG PO TABS: 25.0000 mg | ORAL_TABLET | Freq: Every day | ORAL | 5 refills | Status: DC

## 2020-06-01 NOTE — Progress Notes (Signed)
Spironolactone sent to [facility]

## 2020-06-13 DIAGNOSIS — E1142 Type 2 diabetes mellitus with diabetic polyneuropathy: Secondary | ICD-10-CM | POA: Diagnosis not present

## 2020-06-13 DIAGNOSIS — E1159 Type 2 diabetes mellitus with other circulatory complications: Secondary | ICD-10-CM | POA: Diagnosis not present

## 2020-06-13 DIAGNOSIS — Z794 Long term (current) use of insulin: Secondary | ICD-10-CM | POA: Diagnosis not present

## 2020-09-28 ENCOUNTER — Other Ambulatory Visit: Payer: Self-pay

## 2020-09-28 ENCOUNTER — Encounter: Payer: Self-pay | Admitting: Family

## 2020-09-28 ENCOUNTER — Ambulatory Visit: Payer: Medicare PPO | Attending: Family | Admitting: Family

## 2020-09-28 VITALS — BP 132/76 | HR 77 | Resp 18 | Ht 72.0 in | Wt 233.0 lb

## 2020-09-28 DIAGNOSIS — Z833 Family history of diabetes mellitus: Secondary | ICD-10-CM | POA: Diagnosis not present

## 2020-09-28 DIAGNOSIS — E119 Type 2 diabetes mellitus without complications: Secondary | ICD-10-CM | POA: Diagnosis not present

## 2020-09-28 DIAGNOSIS — Z794 Long term (current) use of insulin: Secondary | ICD-10-CM | POA: Diagnosis not present

## 2020-09-28 DIAGNOSIS — I509 Heart failure, unspecified: Secondary | ICD-10-CM | POA: Insufficient documentation

## 2020-09-28 DIAGNOSIS — Z79899 Other long term (current) drug therapy: Secondary | ICD-10-CM | POA: Diagnosis not present

## 2020-09-28 DIAGNOSIS — Z951 Presence of aortocoronary bypass graft: Secondary | ICD-10-CM | POA: Diagnosis not present

## 2020-09-28 DIAGNOSIS — I110 Hypertensive heart disease with heart failure: Secondary | ICD-10-CM | POA: Insufficient documentation

## 2020-09-28 DIAGNOSIS — I5032 Chronic diastolic (congestive) heart failure: Secondary | ICD-10-CM

## 2020-09-28 DIAGNOSIS — I10 Essential (primary) hypertension: Secondary | ICD-10-CM

## 2020-09-28 NOTE — Patient Instructions (Signed)
Continue weighing daily and call for an overnight weight gain of > 2 pounds or a weekly weight gain of >5 pounds. 

## 2020-09-28 NOTE — Progress Notes (Signed)
Patient ID: Scott Hale, male    DOB: 06/05/1950, 70 y.o.   MRN: [id number]    [name] is a 70 y/o male with a history of diabetes, HTN and chronic heart failure.   Echo report from 03/31/20 reviewed and showed an EF of 55-60% along with mild MR. Echo report from 04/29/17 reviewed and showed an EF of 35% along with mild MR/TR/AR. Stress test performed 04/29/17 and showed an EF of 31% along with inferolateral T wave inversions with stress.  He hasn't been in the ED or admitted in the last 6 months.    He presents today with a chief complaint of a follow-up visit. He currently has not symptoms and specifically denies any difficulty sleeping, abdominal distention, palpitations, pedal edema, chest pain, shortness of breath, cough, dizziness, fatigue or weight gain.   Continues to go to the gym and lifts weights.   Past Medical History:  Diagnosis Date   CHF (congestive heart failure) (HCC)    Diabetes mellitus without complication (HCC)    Erectile dysfunction    Hypertension    Past Surgical History:  Procedure Laterality Date   COLONOSCOPY WITH PROPOFOL N/A 04/09/2017   Procedure: COLONOSCOPY WITH PROPOFOL;  Surgeon: Wyline Mood, MD;  Location: [facility] ENDOSCOPY;  Service: Gastroenterology;  Laterality: N/A;   CORONARY ARTERY BYPASS GRAFT  2003   JOINT REPLACEMENT     Family History  Problem Relation Age of Onset   Diabetes Mother    Alcoholism Father    Diabetes Sister    Diabetes Sister    Arrhythmia Sister    Diabetes Sister    Social History   Tobacco Use   Smoking status: Never   Smokeless tobacco: Never  Substance Use Topics   Alcohol use: No   No Known Allergies   Prior to Admission medications   Medication Sig Start Date End Date Taking? Authorizing Provider  ASPIRIN 81 PO Take by mouth daily.    Yes [provider]  atorvastatin (LIPITOR) 40 MG tablet Take 40 mg by mouth daily. 08/07/16  Yes [provider]  carvedilol (COREG) 6.25 MG tablet Take  6.25 mg by mouth 2 (two) times daily with a meal.   Yes [provider]  cholecalciferol (VITAMIN D) 25 MCG (1000 UT) tablet Take 1,000 Units by mouth daily.   Yes [provider]  Dulaglutide 1.5 MG/0.5ML SOPN Inject into the skin. 09/17/19  Yes [provider]  ENTRESTO 97-103 MG Take 1 tablet by mouth twice daily 08/21/18  Yes Clarisa Kindred A, FNP  ibuprofen (ADVIL,MOTRIN) 200 MG tablet Take 200 mg by mouth every 6 (six) hours as needed.   Yes [provider]  insulin detemir (LEVEMIR) 100 UNIT/ML injection Inject 80 Units into the skin at bedtime.  08/20/16  Yes [provider]  NON FORMULARY 386 mg 2 (two) times daily. Testosterone booster OTC product   Yes [provider]  spironolactone (ALDACTONE) 25 MG tablet Take 1 tablet (25 mg total) by mouth daily. 06/01/20  Yes Clarisa Kindred A, FNP  tadalafil (CIALIS) 5 MG tablet Take 5 mg by mouth daily as needed for erectile dysfunction.   Yes [provider]  vitamin B-12 (CYANOCOBALAMIN) 500 MCG tablet Take 500 mcg by mouth daily.   Yes [provider]    Review of Systems  Constitutional:  Negative for appetite change and fatigue.  HENT:  Negative for congestion, postnasal drip and sore throat.   Eyes: Negative.   Respiratory:  Negative for cough, chest tightness and shortness of breath.   Cardiovascular:  Negative for chest pain, palpitations and leg swelling.  Gastrointestinal:  Negative for abdominal distention and abdominal pain.  Endocrine: Negative.   Genitourinary: Negative.   Musculoskeletal:  Negative for back pain and neck pain.  Skin: Negative.   Allergic/Immunologic: Negative.   Neurological:  Negative for dizziness and light-headedness.  Hematological:  Negative for adenopathy. Does not bruise/bleed easily.  Psychiatric/Behavioral:  Negative for dysphoric mood and sleep disturbance. The patient is not nervous/anxious.    Vitals:   09/28/20 1046  BP: 132/76   Pulse: 77  Resp: 18  SpO2: 100%  Weight: 233 lb (105.7 kg)  Height: 6' (1.829 m)   Wt Readings from Last 3 Encounters:  09/28/20 233 lb (105.7 kg)  03/31/20 246 lb 2 oz (111.6 kg)  09/17/19 234 lb (106.1 kg)   Lab Results  Component Value Date   CREATININE 1.14 12/15/2018   CREATININE 1.13 04/22/2018   CREATININE 1.32 (H) 09/09/2017    Physical Exam Vitals and nursing note reviewed.  Constitutional:      Appearance: He is well-developed.  HENT:     Head: Normocephalic and atraumatic.  Neck:     Vascular: No JVD.  Cardiovascular:     Rate and Rhythm: Normal rate and regular rhythm.  Pulmonary:     Effort: Pulmonary effort is normal.     Breath sounds: No wheezing or rales.  Abdominal:     General: There is no distension.     Palpations: Abdomen is soft.     Tenderness: There is no abdominal tenderness.  Musculoskeletal:        General: No tenderness.     Cervical back: Normal range of motion and neck supple.  Skin:    General: Skin is warm and dry.  Neurological:     Mental Status: He is alert and oriented to person, place, and time.  Psychiatric:        Behavior: Behavior normal.        Thought Content: Thought content normal.   Assessment & Plan:  1: Chronic heart failure with now preserved ejection fraction- - NYHA class I - euvolemic today - weighing daily and he was reminded to call for an overnight weight gain of >2 pounds or a weekly weight gain of >5 pounds - weight down 13 pounds from last visit here 6 months ago - going to the gym and power lifting weights daily at [employer] - not adding salt to his food. Reviewed the importance of closely following a 2000mg  sodium diet  - saw cardiology ) 11/09/19 - BNP 03/24/17 was 185.0  2: HTN- - BP looks good - saw PCP 05/22/17) 08/17/20 - BMP on 06/13/20 reviewed and showed sodium 137, potassium 4.7, creatinine 1.5 and GFR 57  3: Diabetes-  - A1c on 06/13/20 was 8.3% - saw nephrology ([facility])  10/27/19 - glucose at home today was 98   Patient did not bring his medications nor a list. Each medication was verbally reviewed with the patient and he was encouraged to bring the bottles to every visit to confirm accuracy of list.  Return in 6 months or sooner for any questions/problems before then.

## 2020-12-28 ENCOUNTER — Telehealth: Payer: Self-pay | Admitting: Family

## 2020-12-28 NOTE — Telephone Encounter (Signed)
LVM with patient to let him know we need proof of income for his [employer] patient assistance as soon as possible to receive approval.   [name], NT

## 2021-01-27 ENCOUNTER — Other Ambulatory Visit: Payer: Self-pay | Admitting: Family

## 2021-01-27 MED ORDER — ENTRESTO 97-103 MG PO TABS: 1.0000 | ORAL_TABLET | Freq: Two times a day (BID) | ORAL | 3 refills | Status: DC

## 2021-01-27 NOTE — Progress Notes (Signed)
Entresto RX sent to [facility] [facility]

## 2021-02-22 ENCOUNTER — Telehealth: Payer: Self-pay | Admitting: Family

## 2021-02-22 NOTE — Telephone Encounter (Signed)
Notified patient he was approved for [employer] patient assistance for Entresto till 02/11/22.    Scott Hale, NT

## 2021-03-06 ENCOUNTER — Encounter: Payer: Self-pay | Admitting: *Deleted

## 2021-03-07 ENCOUNTER — Ambulatory Visit: Payer: No Typology Code available for payment source | Admitting: Anesthesiology

## 2021-03-07 ENCOUNTER — Ambulatory Visit
Admission: RE | Admit: 2021-03-07 | Discharge: 2021-03-07 | Disposition: A | Discharge status: Home / Self Care | Payer: No Typology Code available for payment source | Attending: Gastroenterology | Admitting: Gastroenterology

## 2021-03-07 ENCOUNTER — Encounter: Payer: Self-pay | Admitting: *Deleted

## 2021-03-07 ENCOUNTER — Encounter
Admission: RE | Disposition: A | Discharge status: Home / Self Care | Payer: Self-pay | Source: Home / Self Care | Attending: Gastroenterology

## 2021-03-07 DIAGNOSIS — I255 Ischemic cardiomyopathy: Secondary | ICD-10-CM | POA: Diagnosis not present

## 2021-03-07 DIAGNOSIS — K641 Second degree hemorrhoids: Secondary | ICD-10-CM | POA: Diagnosis not present

## 2021-03-07 DIAGNOSIS — E119 Type 2 diabetes mellitus without complications: Secondary | ICD-10-CM | POA: Diagnosis not present

## 2021-03-07 DIAGNOSIS — I2510 Atherosclerotic heart disease of native coronary artery without angina pectoris: Secondary | ICD-10-CM | POA: Insufficient documentation

## 2021-03-07 DIAGNOSIS — Z1211 Encounter for screening for malignant neoplasm of colon: Secondary | ICD-10-CM | POA: Insufficient documentation

## 2021-03-07 DIAGNOSIS — D126 Benign neoplasm of colon, unspecified: Secondary | ICD-10-CM | POA: Diagnosis not present

## 2021-03-07 DIAGNOSIS — K635 Polyp of colon: Secondary | ICD-10-CM | POA: Insufficient documentation

## 2021-03-07 DIAGNOSIS — K648 Other hemorrhoids: Secondary | ICD-10-CM | POA: Insufficient documentation

## 2021-03-07 DIAGNOSIS — M1990 Unspecified osteoarthritis, unspecified site: Secondary | ICD-10-CM | POA: Diagnosis not present

## 2021-03-07 DIAGNOSIS — K649 Unspecified hemorrhoids: Secondary | ICD-10-CM | POA: Diagnosis not present

## 2021-03-07 HISTORY — DX: Sleep apnea, unspecified: G47.30

## 2021-03-07 HISTORY — DX: Personal history of urinary calculi: Z87.442

## 2021-03-07 HISTORY — DX: Unspecified osteoarthritis, unspecified site: M19.90

## 2021-03-07 HISTORY — PX: COLONOSCOPY: SHX5424

## 2021-03-07 LAB — GLUCOSE, CAPILLARY: Glucose-Capillary: 144 mg/dL — ABNORMAL HIGH (ref 70–99)

## 2021-03-07 SURGERY — COLONOSCOPY
Anesthesia: General

## 2021-03-07 MED ORDER — PROPOFOL 500 MG/50ML IV EMUL
INTRAVENOUS | Status: DC | PRN
  Administered 2021-03-07: 165 ug/kg/min via INTRAVENOUS

## 2021-03-07 MED ORDER — LIDOCAINE HCL (PF) 2 % IJ SOLN
INTRAMUSCULAR | Status: AC
  Filled 2021-03-07: qty 30

## 2021-03-07 MED ORDER — LIDOCAINE HCL (CARDIAC) PF 100 MG/5ML IV SOSY
PREFILLED_SYRINGE | INTRAVENOUS | Status: DC | PRN
  Administered 2021-03-07: 100 mg via INTRAVENOUS

## 2021-03-07 MED ORDER — PROPOFOL 500 MG/50ML IV EMUL
INTRAVENOUS | Status: AC
  Filled 2021-03-07: qty 550

## 2021-03-07 MED ORDER — LIDOCAINE HCL (PF) 2 % IJ SOLN
INTRAMUSCULAR | Status: AC
  Filled 2021-03-07: qty 5

## 2021-03-07 MED ORDER — PROPOFOL 10 MG/ML IV BOLUS
INTRAVENOUS | Status: AC
  Filled 2021-03-07: qty 40

## 2021-03-07 MED ORDER — SODIUM CHLORIDE 0.9 % IV SOLN
INTRAVENOUS | Status: DC
  Administered 2021-03-07: 07:00:00 1000 mL via INTRAVENOUS

## 2021-03-07 MED ORDER — GLYCOPYRROLATE 0.2 MG/ML IJ SOLN
INTRAMUSCULAR | Status: DC | PRN
  Administered 2021-03-07: .2 mg via INTRAVENOUS

## 2021-03-07 MED ORDER — PROPOFOL 10 MG/ML IV BOLUS
INTRAVENOUS | Status: DC | PRN
  Administered 2021-03-07 (×2): 10 mg via INTRAVENOUS
  Administered 2021-03-07: 70 mg via INTRAVENOUS

## 2021-03-07 MED ORDER — PHENYLEPHRINE HCL (PRESSORS) 10 MG/ML IV SOLN
INTRAVENOUS | Status: AC
  Filled 2021-03-07: qty 1

## 2021-03-07 NOTE — Transfer of Care (Signed)
Immediate Anesthesia Transfer of Care Note  Patient: Scott Hale  Procedure(s) Performed: COLONOSCOPY  Patient Location: Endoscopy Unit  Anesthesia Type:General  Level of Consciousness: drowsy and patient cooperative  Airway & Oxygen Therapy: Patient Spontanous Breathing and Patient connected to face mask oxygen  Post-op Assessment: Report given to RN and Post -op Vital signs reviewed and stable  Post vital signs: Reviewed and stable  Last Vitals:  Vitals Value Taken Time  BP    Temp    Pulse    Resp    SpO2      Last Pain:  Vitals:   03/07/21 0709  TempSrc: Temporal  PainSc: 0-No pain         Complications: No notable events documented.

## 2021-03-07 NOTE — Anesthesia Preprocedure Evaluation (Addendum)
Anesthesia Evaluation  Patient identified by MRN, date of birth, ID band Patient awake    Reviewed: Allergy & Precautions, NPO status , Patient's Chart, lab work & pertinent test results  History of Anesthesia Complications Negative for: history of anesthetic complications  Airway Mallampati: III  TM Distance: >3 FB Neck ROM: Full    Dental no notable dental hx. (+) Teeth Intact   Pulmonary sleep apnea , neg COPD, Patient abstained from smoking.Not current smoker,    Pulmonary exam normal breath sounds clear to auscultation       Cardiovascular Exercise Tolerance: Good METS: 5 - 7 Mets hypertension, + CAD, + CABG and +CHF  (-) Past MI (-) dysrhythmias  Rhythm:Regular Rate:Normal - Systolic murmurs TTE 2022: 1. Left ventricular ejection fraction, by estimation, is 55 to 60%. The  left ventricle has normal function. The left ventricle has no regional  wall motion abnormalities. Left ventricular diastolic parameters are  consistent with Grade II diastolic  dysfunction (pseudonormalization).  2. Right ventricular systolic function is normal. The right ventricular  size is normal.  3. The mitral valve is normal in structure. Mild mitral valve  regurgitation.  4. The aortic valve is normal in structure. Aortic valve regurgitation is  not visualized.    Neuro/Psych negative neurological ROS  negative psych ROS   GI/Hepatic neg GERD  ,(+)     (-) substance abuse  ,   Endo/Other  diabetes, Insulin Dependent  Renal/GU negative Renal ROS     Musculoskeletal  (+) Arthritis ,   Abdominal   Peds  Hematology   Anesthesia Other Findings Past Medical History: No date: Arthritis No date: CHF (congestive heart failure) (HCC) No date: Diabetes mellitus without complication (HCC) No date: Erectile dysfunction No date: History of kidney stones No date: Hypertension No date: Sleep apnea  Reproductive/Obstetrics                             Anesthesia Physical Anesthesia Plan  ASA: 3  Anesthesia Plan: General   Post-op Pain Management: Minimal or no pain anticipated   Induction: Intravenous  PONV Risk Score and Plan: 2 and Propofol infusion, TIVA and Ondansetron  Airway Management Planned: Nasal Cannula  Additional Equipment: None  Intra-op Plan:   Post-operative Plan:   Informed Consent: I have reviewed the patients History and Physical, chart, labs and discussed the procedure including the risks, benefits and alternatives for the proposed anesthesia with the patient or authorized representative who has indicated his/her understanding and acceptance.     Dental advisory given  Plan Discussed with: CRNA and Surgeon  Anesthesia Plan Comments: (Discussed risks of anesthesia with patient, including possibility of difficulty with spontaneous ventilation under anesthesia necessitating airway intervention, PONV, and rare risks such as cardiac or respiratory or neurological events, and allergic reactions. Discussed the role of CRNA in patient's perioperative care. Patient understands.)        Anesthesia Quick Evaluation

## 2021-03-07 NOTE — Anesthesia Postprocedure Evaluation (Signed)
Anesthesia Post Note  Patient: Scott Hale  Procedure(s) Performed: COLONOSCOPY  Patient location during evaluation: Endoscopy Anesthesia Type: General Level of consciousness: awake and alert Pain management: pain level controlled Vital Signs Assessment: post-procedure vital signs reviewed and stable Respiratory status: spontaneous breathing, nonlabored ventilation, respiratory function stable and patient connected to nasal cannula oxygen Cardiovascular status: blood pressure returned to baseline and stable Postop Assessment: no apparent nausea or vomiting Anesthetic complications: no   No notable events documented.   Last Vitals:  Vitals:   03/07/21 0811 03/07/21 0821  BP: (!) 99/57 113/71  Pulse: 69 77  Resp: 15 16  Temp: (!) 36.1 C   SpO2: 100% 95%    Last Pain:  Vitals:   03/07/21 0821  TempSrc:   PainSc: 0-No pain                 Arita Miss

## 2021-03-07 NOTE — H&P (Signed)
Outpatient short stay form Pre-procedure 03/07/2021  Scott Bill, MD  Primary Physician: Jerl Mina, MD  Reason for visit:  Screening  History of present illness:    71 y/o gentleman with history of CAD, DM II, and ischemic cardiomyopathy here for screening colonoscopy. Last colonoscopy was in 2019 but was aborted due to poor prep. Had colon[place] in [place] in [place] which was reportedly normal. No blood thinners. No family history of GI malignancies. No significant abdominal surgeries.    Current Facility-Administered Medications:    0.9 %  sodium chloride infusion, , Intravenous, Continuous, Alba Perillo, Rossie Muskrat, MD, Last Rate: 20 mL/hr at 03/07/21 0727, 1,000 mL at 03/07/21 0727  Medications Prior to Admission  Medication Sig Dispense Refill Last Dose   acetaminophen (TYLENOL) 500 MG tablet Take 500 mg by mouth every 8 (eight) hours as needed.    at prn   ASPIRIN 81 PO Take by mouth daily.    Past Week   atorvastatin (LIPITOR) 40 MG tablet Take 40 mg by mouth daily.   Past Week   carvedilol (COREG) 6.25 MG tablet Take 6.25 mg by mouth 2 (two) times daily with a meal.   Past Week   cholecalciferol (VITAMIN D) 25 MCG (1000 UT) tablet Take 1,000 Units by mouth daily.   Past Week   Creatine Monohydrate POWD Take 1 Scoop by mouth daily.   Past Week   Dulaglutide 1.5 MG/0.5ML SOPN Inject into the skin.   Past Week   insulin detemir (LEVEMIR) 100 UNIT/ML injection Inject 80 Units into the skin at bedtime.    Past Week   Multiple Vitamin (MULTIVITAMIN) tablet Take 1 tablet by mouth daily.   Past Week   NON FORMULARY 386 mg 2 (two) times daily. Testosterone booster OTC product   Past Week   sacubitril-valsartan (ENTRESTO) 97-103 MG Take 1 tablet by mouth 2 (two) times daily. 180 tablet 3 Past Week   spironolactone (ALDACTONE) 25 MG tablet Take 1 tablet (25 mg total) by mouth daily. 30 tablet 5 Past Week   vitamin B-12 (CYANOCOBALAMIN) 500 MCG tablet Take 500 mcg by mouth daily.    Past Week   ibuprofen (ADVIL,MOTRIN) 200 MG tablet Take 200 mg by mouth every 6 (six) hours as needed.    at prn   tadalafil (CIALIS) 5 MG tablet Take by mouth daily as needed for erectile dysfunction.        No Known Allergies   Past Medical History:  Diagnosis Date   Arthritis    CHF (congestive heart failure) (HCC)    Diabetes mellitus without complication (HCC)    Erectile dysfunction    History of kidney stones    Hypertension    Sleep apnea     Review of systems:  Otherwise negative.    Physical Exam  Gen: Alert, oriented. Appears stated age.  HEENT: PERRLA. Lungs: No respiratory distress CV: RRR Abd: soft, benign, no masses Ext: No edema    Planned procedures: Proceed with colonoscopy. The patient understands the nature of the planned procedure, indications, risks, alternatives and potential complications including but not limited to bleeding, infection, perforation, damage to internal organs and possible oversedation/side effects from anesthesia. The patient agrees and gives consent to proceed.  Please refer to procedure notes for findings, recommendations and patient disposition/instructions.     Scott Bill, MD [facility] Gastroenterology

## 2021-03-07 NOTE — Interval H&P Note (Signed)
History and Physical Interval Note:  03/07/2021 7:40 AM  Scott Hale  has presented today for surgery, with the diagnosis of CCA SCREEN.  The various methods of treatment have been discussed with the patient and family. After consideration of risks, benefits and other options for treatment, the patient has consented to  Procedure(s) with comments: COLONOSCOPY (N/A) - IDDM as a surgical intervention.  The patient's history has been reviewed, patient examined, no change in status, stable for surgery.  I have reviewed the patient's chart and labs.  Questions were answered to the patient's satisfaction.     Regis Bill  Ok to proceed with colonoscopy

## 2021-03-07 NOTE — Op Note (Signed)
[facility] Patient Name: Scott Hale Procedure Date: 03/07/2021 7:43 AM MRN: [id number] Account #: [id number] Date of Birth: [birth year] Admit Type: Outpatient Age: 70 Room: [facility] ENDO ROOM 1 Gender: Male Note Status: Finalized Instrument Name: Jasper Riling [id number] Procedure:             Colonoscopy Indications:           Screening for colorectal malignant neoplasm Providers:             Andrey Farmer MD, MD Referring MD:          Irven Easterly. Kary Kos, MD (Referring MD) Medicines:             Monitored Anesthesia Care Complications:         No immediate complications. Estimated blood loss:                         Minimal. Procedure:             Pre-Anesthesia Assessment:                        - Prior to the procedure, a History and Physical was                         performed, and patient medications and allergies were                         reviewed. The patient is competent. The risks and                         benefits of the procedure and the sedation options and                         risks were discussed with the patient. All questions                         were answered and informed consent was obtained.                         Patient identification and proposed procedure were                         verified by the physician, the nurse, the                         anesthesiologist, the anesthetist and the technician                         in the endoscopy suite. Mental Status Examination:                         alert and oriented. Airway Examination: normal                         oropharyngeal airway and neck mobility. Respiratory                         Examination: clear to auscultation. CV Examination:  normal. Prophylactic Antibiotics: The patient does not                         require prophylactic antibiotics. Prior                         Anticoagulants: The patient has taken no previous                          anticoagulant or antiplatelet agents except for                         aspirin. ASA Grade Assessment: III - A patient with                         severe systemic disease. After reviewing the risks and                         benefits, the patient was deemed in satisfactory                         condition to undergo the procedure. The anesthesia                         plan was to use monitored anesthesia care (MAC).                         Immediately prior to administration of medications,                         the patient was re-assessed for adequacy to receive                         sedatives. The heart rate, respiratory rate, oxygen                         saturations, blood pressure, adequacy of pulmonary                         ventilation, and response to care were monitored                         throughout the procedure. The physical status of the                         patient was re-assessed after the procedure.                        After obtaining informed consent, the colonoscope was                         passed under direct vision. Throughout the procedure,                         the patient's blood pressure, pulse, and oxygen                         saturations were monitored continuously. The  Colonoscope was introduced through the anus and                         advanced to the the cecum, identified by appendiceal                         orifice and ileocecal valve. The colonoscopy was                         performed without difficulty. The patient tolerated                         the procedure well. The quality of the bowel                         preparation was good. Findings:      The perianal and digital rectal examinations were normal.      A 1 mm polyp was found in the ascending colon. The polyp was sessile.       The polyp was removed with a jumbo cold forceps. Resection and retrieval       were complete.  Estimated blood loss was minimal.      A 2 mm polyp was found in the descending colon. The polyp was sessile.       The polyp was removed with a jumbo cold forceps. Resection and retrieval       were complete. Estimated blood loss was minimal. Estimated blood loss       was minimal.      Internal hemorrhoids were found during retroflexion. The hemorrhoids       were Grade II (internal hemorrhoids that prolapse but reduce       spontaneously).      The exam was otherwise without abnormality on direct and retroflexion       views. Impression:            - One 1 mm polyp in the ascending colon, removed with                         a jumbo cold forceps. Resected and retrieved.                        - One 2 mm polyp in the descending colon, removed with                         a jumbo cold forceps. Resected and retrieved.                        - Internal hemorrhoids.                        - The examination was otherwise normal on direct and                         retroflexion views. Recommendation:        - Discharge patient to home.                        - Resume previous diet.                        -  Continue present medications.                        - Await pathology results.                        - Repeat colonoscopy for surveillance based on                         pathology results.                        - Return to referring physician as previously                         scheduled. Procedure Code(s):     --- Professional ---                        [phone], Colonoscopy, flexible; with biopsy, single or                         multiple Diagnosis Code(s):     --- Professional ---                        Z12.11, Encounter for screening for malignant neoplasm                         of colon                        K63.5, Polyp of colon                        K64.1, Second degree hemorrhoids CPT copyright 2019 American Medical Association. All rights reserved. The codes  documented in this report are preliminary and upon coder review may  be revised to meet current compliance requirements. Andrey Farmer MD, MD 03/07/2021 8:12:49 AM Number of Addenda: 0 Note Initiated On: 03/07/2021 7:43 AM Scope Withdrawal Time: 0 hours 12 minutes 37 seconds  Total Procedure Duration: 0 hours 18 minutes 36 seconds  Estimated Blood Loss:  Estimated blood loss was minimal.      [facility]

## 2021-03-07 NOTE — Anesthesia Procedure Notes (Signed)
Procedure Name: General with mask airway Date/Time: 03/07/2021 7:56 AM Performed by: Mohammed Kindle, CRNA Pre-anesthesia Checklist: Patient identified, Emergency Drugs available, Suction available and Patient being monitored Patient Re-evaluated:Patient Re-evaluated prior to induction Oxygen Delivery Method: Simple face mask Induction Type: IV induction Placement Confirmation: positive ETCO2, CO2 detector and breath sounds checked- equal and bilateral Dental Injury: Teeth and Oropharynx as per pre-operative assessment

## 2021-03-08 ENCOUNTER — Encounter: Payer: Self-pay | Admitting: Gastroenterology

## 2021-03-08 LAB — SURGICAL PATHOLOGY

## 2021-03-27 NOTE — Progress Notes (Signed)
Patient ID: Scott Hale, male    DOB: [birth year], 71 y.o.   MRN: [id number]  Scott Hale is a 71 y/o male with a history of diabetes, HTN and chronic heart failure.   Echo report from 03/31/20 reviewed and showed an EF of 55-60% along with mild Scott. Echo report from 04/29/17 reviewed and showed an EF of 35% along with mild Scott/TR/AR. Stress test performed 04/29/17 and showed an EF of 31% along with inferolateral T wave inversions with stress.  He hasn't been in the ED or admitted in the last 6 months.    He presents today with a chief complaint of a follow-up visit. Currently denies any symptoms and specifically denies any difficulty sleeping, dizziness, abdominal distention, palpitations, pedal edema, chest pain, shortness of breath, cough or fatigue.   Continues to go to the gym and lifts weights and endorses that he's been adding on muscle mass. Says that he's weighing daily and that his weight has gradually increased due to the weight lifting.   Was taking levemir and trulicity but says that his glucose would drop into the 60's so he stopped taking the trulicity. Has not spoken to his endocrinologist about this.    Past Medical History:  Diagnosis Date   Arthritis    CHF (congestive heart failure) (HCC)    Diabetes mellitus without complication (HCC)    Erectile dysfunction    History of kidney stones    Hypertension    Sleep apnea    Past Surgical History:  Procedure Laterality Date   CARDIAC CATHETERIZATION     COLONOSCOPY N/A 03/07/2021   Procedure: COLONOSCOPY;  Surgeon: Regis Bill, MD;  Location: [facility] ENDOSCOPY;  Service: Endoscopy;  Laterality: N/A;  IDDM   COLONOSCOPY WITH PROPOFOL N/A 04/09/2017   Procedure: COLONOSCOPY WITH PROPOFOL;  Surgeon: Wyline Mood, MD;  Location: [facility] ENDOSCOPY;  Service: Gastroenterology;  Laterality: N/A;   CORONARY ARTERY BYPASS GRAFT  2003   JOINT REPLACEMENT     total knee   RENAL ARTERY STENT     due to kidney stones   Family History   Problem Relation Age of Onset   Diabetes Mother    Alcoholism Father    Diabetes Sister    Diabetes Sister    Arrhythmia Sister    Diabetes Sister    Social History   Tobacco Use   Smoking status: Never   Smokeless tobacco: Never  Substance Use Topics   Alcohol use: No   No Known Allergies   Prior to Admission medications   Medication Sig Start Date End Date Taking? Authorizing Provider  acetaminophen (TYLENOL) 500 MG tablet Take 500 mg by mouth every 8 (eight) hours as needed.   Yes [provider]  ASPIRIN 81 PO Take by mouth daily.    Yes [provider]  carvedilol (COREG) 6.25 MG tablet Take 6.25 mg by mouth 2 (two) times daily with a meal.   Yes [provider]  Creatine Monohydrate POWD Take 1 Scoop by mouth daily.   Yes [provider]  insulin detemir (LEVEMIR) 100 UNIT/ML injection Inject 80 Units into the skin at bedtime.  08/20/16  Yes [provider]  Multiple Vitamin (MULTIVITAMIN) tablet Take 1 tablet by mouth daily.   Yes [provider]  sacubitril-valsartan (ENTRESTO) 97-103 MG Take 1 tablet by mouth 2 (two) times daily. 01/27/21  Yes Clarisa Kindred A, FNP  spironolactone (ALDACTONE) 25 MG tablet Take 1 tablet (25 mg total) by mouth daily. 06/01/20  Yes Clarisa Kindred A, FNP  tadalafil (CIALIS) 5 MG tablet Take by mouth daily as needed for erectile dysfunction.   Yes [provider]  vitamin B-12 (CYANOCOBALAMIN) 500 MCG tablet Take 500 mcg by mouth daily.   Yes [provider]  atorvastatin (LIPITOR) 40 MG tablet Take 40 mg by mouth daily. 08/07/16   [provider]  cholecalciferol (VITAMIN D) 25 MCG (1000 UT) tablet Take 1,000 Units by mouth daily.    [provider]  Dulaglutide 1.5 MG/0.5ML SOPN Inject into the skin. Patient not taking: Reported on 03/29/2021 09/17/19   [provider]  NON FORMULARY 386 mg 2 (two) times daily. Testosterone booster OTC product Patient  not taking: Reported on 03/29/2021    [provider]   Review of Systems  Constitutional:  Negative for appetite change and fatigue.  HENT:  Negative for congestion, postnasal drip and sore throat.   Eyes: Negative.   Respiratory:  Negative for cough, chest tightness and shortness of breath.   Cardiovascular:  Negative for chest pain, palpitations and leg swelling.  Gastrointestinal:  Negative for abdominal distention and abdominal pain.  Endocrine: Negative.   Genitourinary: Negative.   Musculoskeletal:  Negative for back pain and neck pain.  Skin: Negative.   Allergic/Immunologic: Negative.   Neurological:  Negative for dizziness and light-headedness.  Hematological:  Negative for adenopathy. Does not bruise/bleed easily.  Psychiatric/Behavioral:  Negative for dysphoric mood and sleep disturbance. The patient is not nervous/anxious.    Vitals:   03/29/21 1055 03/29/21 1138  BP: (!) 162/80 (!) 148/84  Pulse: (!) 56   Resp: 14   SpO2: 98%   Weight: 256 lb 5 oz (116.3 kg)   Height: 6' (1.829 m)    Wt Readings from Last 3 Encounters:  03/29/21 256 lb 5 oz (116.3 kg)  03/07/21 250 lb 8.1 oz (113.6 kg)  09/28/20 233 lb (105.7 kg)   Lab Results  Component Value Date   CREATININE 1.14 12/15/2018   CREATININE 1.13 04/22/2018   CREATININE 1.32 (H) 09/09/2017   Physical Exam Vitals and nursing note reviewed.  Constitutional:      Appearance: He is well-developed.  HENT:     Head: Normocephalic and atraumatic.  Neck:     Vascular: No JVD.  Cardiovascular:     Rate and Rhythm: Regular rhythm. Bradycardia present.  Pulmonary:     Effort: Pulmonary effort is normal.     Breath sounds: No wheezing or rales.  Abdominal:     General: There is no distension.     Palpations: Abdomen is soft.     Tenderness: There is no abdominal tenderness.  Musculoskeletal:        General: No tenderness.     Cervical back: Normal range of motion and neck supple.  Skin:    General:  Skin is warm and dry.  Neurological:     Mental Status: He is alert and oriented to person, place, and time.  Psychiatric:        Behavior: Behavior normal.        Thought Content: Thought content normal.   Assessment & Plan:  1: Chronic heart failure with preserved ejection fraction without structural changes- - NYHA class I - euvolemic today - weighing daily and he was reminded to call for an overnight weight gain of >2 pounds or a weekly weight gain of >5 pounds - weight up 23 pounds from last visit here 6 months ago; this is due to building muscle  mass - going to the gym and power lifting weights daily at [employer] - not adding salt to his food. Reviewed the importance of closely following a 2000mg  sodium diet  - saw cardiology (Fath) 11/10/20 - BNP 03/24/17 was 185.0 - PharmD reconciled medications with the patient  2: HTN- - BP elevated (162/80) with slight improvement upon recheck with manual cuff (148/84) - saw PCP 05/22/17) 08/17/20 - BMP on 10/11/20 reviewed and showed sodium 140, potassium 4.4, creatinine 1.4 and GFR 61  3: Diabetes-  - A1c on 10/11/20 was 7.1% - saw endocrinology (Solum) 10/11/20 - glucose at home running 234-250 - stopped trulicity due to glucose in the 60's; explained that he needed to contact Dr. 10/13/20 and let her know that he's not taking the trulicity   Patient did not bring his medications nor a list. Each medication was verbally reviewed with the patient and he was encouraged to bring the bottles to every visit to confirm accuracy of list.  Return in 6 months, sooner if needed.

## 2021-03-29 ENCOUNTER — Encounter: Payer: Self-pay | Admitting: Pharmacist

## 2021-03-29 ENCOUNTER — Encounter: Payer: Self-pay | Admitting: Family

## 2021-03-29 ENCOUNTER — Other Ambulatory Visit: Payer: Self-pay

## 2021-03-29 ENCOUNTER — Ambulatory Visit: Payer: No Typology Code available for payment source | Attending: Family | Admitting: Family

## 2021-03-29 VITALS — BP 148/84 | HR 56 | Resp 14 | Ht 72.0 in | Wt 256.3 lb

## 2021-03-29 DIAGNOSIS — I110 Hypertensive heart disease with heart failure: Secondary | ICD-10-CM | POA: Insufficient documentation

## 2021-03-29 DIAGNOSIS — I5032 Chronic diastolic (congestive) heart failure: Secondary | ICD-10-CM | POA: Insufficient documentation

## 2021-03-29 DIAGNOSIS — I10 Essential (primary) hypertension: Secondary | ICD-10-CM | POA: Diagnosis not present

## 2021-03-29 DIAGNOSIS — E119 Type 2 diabetes mellitus without complications: Secondary | ICD-10-CM | POA: Insufficient documentation

## 2021-03-29 DIAGNOSIS — Z794 Long term (current) use of insulin: Secondary | ICD-10-CM | POA: Diagnosis not present

## 2021-03-29 NOTE — Progress Notes (Signed)
[facility] - HE[facility] FAILURE CLINIC - PHARMACIST COUNSELING NOTE  Guideline-Directed Medical Therapy/Evidence Based Medicine  ACE/ARB/ARNI: Sacubitril-valsartan 97-103 mg twice daily Beta Blocker: Carvedilol 6.25 mg twice daily Aldosterone Antagonist: Spironolactone 25 mg daily Diuretic:  N/A SGLT2i:  N/A  Adherence Assessment  Do you ever forget to take your medication? [] Yes [x] No  Do you ever skip doses due to side effects? [] Yes [x] No  Do you have trouble affording your medicines? [] Yes [x] No  Are you ever unable to pick up your medication due to transportation difficulties? [] Yes [x] No  Do you ever stop taking your medications because you don't believe they are helping? [] Yes [x] No  Do you check your weight daily? [x] Yes [] No   Adherence strategy: Patient reports compliance to medications. Will be getting meds from [facility] mail order pharmacy starting next month  Barriers to obtaining medications: Patient unsure if they have enough carvedilol to last until mail order coverage kicks in. Informed patient to call  Vital signs: HR 56, BP 148/84, weight (pounds) 256 lbs ECHO Initial: 04/29/2017, EF 35%, notes moderate LV systolic dysfunction with mild valvular regurgitation and moderate global decrease ECHO F/up: 03/31/2020, EF 55 to 60%, notes mild mitral valve regurgitation and grade II diastolic dysfunction (pseudonormalization). Cath: N/A  BMP Latest Ref Rng & Units 12/15/2018 04/22/2018 09/09/2017  Glucose 70 - 99 mg/dL 99 ) )  BUN 8 - 23 mg/dL 13 12 10   Creatinine 0.61 - 1.24 mg/dL   )  Sodium 135 - 145 mmol/L 138 137 139  Potassium 3.5 - 5.1 mmol/L 4.3 4.3 4.6  Chloride 98 - 111 mmol/L 102 102 104  CO2 22 - 32 mmol/L 27 26 26   Calcium 8.9 - 10.3 mg/dL 9.3 9.4 9.5    Past Medical History:  Diagnosis Date   Arthritis    CHF (congestive heart failure) (HCC)    Diabetes mellitus without complication (HCC)    Erectile  dysfunction    History of kidney stones    Hypertension    Sleep apnea     ASSESSMENT 71 year old male with PMH of diabetes, HTN and chronic heart failure who presents to the HF clinic for follow up visit. Patient was last seen by [facility], NP on 09/28/2020. Patient reports weight gain from muscle mass with improved weight lifting over the past couple of months. No barriers to medication compliance.  Recent ED Visit (past 6 months): No  PLAN  Patient reports they stopped trulicity due to glucose in the 60's; Told patient to notify endocrinologist and let them know that he's not taking the Trulicity No recommendations for medication changes or initiation of therapy at this time.  Time spent: 30 minutes  04/02/2020, Pharm.D. Clinical Pharmacist 03/29/2021 9:51 AM    Current Outpatient Medications:    acetaminophen (TYLENOL) 500 MG tablet, Take 500 mg by mouth every 8 (eight) hours as needed., Disp: , Rfl:    ASPIRIN 81 PO, Take by mouth daily. , Disp: , Rfl:    atorvastatin (LIPITOR) 40 MG tablet, Take 40 mg by mouth daily., Disp: , Rfl:    carvedilol (COREG) 6.25 MG tablet, Take 6.25 mg by mouth 2 (two) times daily with a meal., Disp: , Rfl:    cholecalciferol (VITAMIN D) 25 MCG (1000 UT) tablet, Take 1,000 Units by mouth daily., Disp: , Rfl:    Creatine Monohydrate POWD, Take 1 Scoop by mouth daily., Disp: , Rfl:    Dulaglutide 1.5 MG/0.5ML SOPN, Inject into the skin.,  Disp: , Rfl:    ibuprofen (ADVIL,MOTRIN) 200 MG tablet, Take 200 mg by mouth every 6 (six) hours as needed., Disp: , Rfl:    insulin detemir (LEVEMIR) 100 UNIT/ML injection, Inject 80 Units into the skin at bedtime. , Disp: , Rfl:    Multiple Vitamin (MULTIVITAMIN) tablet, Take 1 tablet by mouth daily., Disp: , Rfl:    NON FORMULARY, 386 mg 2 (two) times daily. Testosterone booster OTC product, Disp: , Rfl:    sacubitril-valsartan (ENTRESTO) 97-103 MG, Take 1 tablet by mouth 2 (two) times daily., Disp: 180  tablet, Rfl: 3   spironolactone (ALDACTONE) 25 MG tablet, Take 1 tablet (25 mg total) by mouth daily., Disp: 30 tablet, Rfl: 5   tadalafil (CIALIS) 5 MG tablet, Take by mouth daily as needed for erectile dysfunction., Disp: , Rfl:    vitamin B-12 (CYANOCOBALAMIN) 500 MCG tablet, Take 500 mcg by mouth daily., Disp: , Rfl:    MEDICATION ADHERENCES TIPS AND STRATEGIES Taking medication as prescribed improves patient outcomes in heart failure (reduces hospitalizations, improves symptoms, increases survival) Side effects of medications can be managed by decreasing doses, switching agents, stopping drugs, or adding additional therapy. Please let someone in the Heart Failure Clinic know if you have having bothersome side effects so we can modify your regimen. Do not alter your medication regimen without talking to [place].  Medication reminders can help patients remember to take drugs on time. If you are missing or forgetting doses you can try linking behaviors, using pill boxes, or an electronic reminder like an alarm on your phone or an app. Some people can also get automated phone calls as medication reminders.

## 2021-03-29 NOTE — Patient Instructions (Signed)
Continue weighing daily and call for an overnight weight gain of 3 pounds or more or a weekly weight gain of more than 5 pounds.  °

## 2021-04-07 ENCOUNTER — Telehealth: Payer: Self-pay | Admitting: Family

## 2021-04-07 NOTE — Telephone Encounter (Signed)
Patient called and is waiting on his Scott Hale to come in the mail and is out of his medication and he will be coming by today to get samples.   Atheena Spano, NT

## 2021-04-12 DIAGNOSIS — E1142 Type 2 diabetes mellitus with diabetic polyneuropathy: Secondary | ICD-10-CM | POA: Diagnosis not present

## 2021-04-12 DIAGNOSIS — Z794 Long term (current) use of insulin: Secondary | ICD-10-CM | POA: Diagnosis not present

## 2021-04-12 DIAGNOSIS — E1159 Type 2 diabetes mellitus with other circulatory complications: Secondary | ICD-10-CM | POA: Diagnosis not present

## 2021-05-18 ENCOUNTER — Encounter: Payer: Self-pay | Admitting: Emergency Medicine

## 2021-05-18 ENCOUNTER — Other Ambulatory Visit: Payer: Self-pay

## 2021-05-18 ENCOUNTER — Emergency Department
Admission: EM | Admit: 2021-05-18 | Discharge: 2021-05-18 | Disposition: A | Discharge status: Home / Self Care | Payer: No Typology Code available for payment source | Attending: Emergency Medicine | Admitting: Emergency Medicine

## 2021-05-18 ENCOUNTER — Emergency Department: Payer: No Typology Code available for payment source

## 2021-05-18 DIAGNOSIS — M545 Low back pain, unspecified: Secondary | ICD-10-CM | POA: Diagnosis not present

## 2021-05-18 DIAGNOSIS — S199XXA Unspecified injury of neck, initial encounter: Secondary | ICD-10-CM | POA: Diagnosis present

## 2021-05-18 DIAGNOSIS — Y92410 Unspecified street and highway as the place of occurrence of the external cause: Secondary | ICD-10-CM | POA: Diagnosis not present

## 2021-05-18 DIAGNOSIS — S39012A Strain of muscle, fascia and tendon of lower back, initial encounter: Secondary | ICD-10-CM | POA: Insufficient documentation

## 2021-05-18 DIAGNOSIS — S161XXA Strain of muscle, fascia and tendon at neck level, initial encounter: Secondary | ICD-10-CM | POA: Insufficient documentation

## 2021-05-18 DIAGNOSIS — M4316 Spondylolisthesis, lumbar region: Secondary | ICD-10-CM | POA: Diagnosis not present

## 2021-05-18 MED ORDER — MELOXICAM 7.5 MG PO TABS: 7.5000 mg | ORAL_TABLET | Freq: Every day | ORAL | 0 refills | Status: DC

## 2021-05-18 MED ORDER — MELOXICAM 7.5 MG PO TABS
7.5000 mg | ORAL_TABLET | Freq: Once | ORAL | Status: AC
  Administered 2021-05-18: 7.5 mg via ORAL
  Filled 2021-05-18: qty 1

## 2021-05-18 MED ORDER — METHOCARBAMOL 500 MG PO TABS: 500.0000 mg | ORAL_TABLET | Freq: Four times a day (QID) | ORAL | 0 refills | Status: DC
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
## 2021-05-30 DIAGNOSIS — M542 Cervicalgia: Secondary | ICD-10-CM | POA: Diagnosis not present

## 2021-05-30 DIAGNOSIS — M545 Low back pain, unspecified: Secondary | ICD-10-CM | POA: Diagnosis not present

## 2021-05-30 DIAGNOSIS — E1151 Type 2 diabetes mellitus with diabetic peripheral angiopathy without gangrene: Secondary | ICD-10-CM | POA: Diagnosis not present

## 2021-05-30 DIAGNOSIS — Z794 Long term (current) use of insulin: Secondary | ICD-10-CM | POA: Diagnosis not present
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
## 2021-09-25 ENCOUNTER — Encounter: Payer: Self-pay | Admitting: Family

## 2021-09-25 ENCOUNTER — Ambulatory Visit: Payer: No Typology Code available for payment source | Attending: Family | Admitting: Family

## 2021-09-25 VITALS — BP 158/86 | HR 77 | Resp 16 | Ht 72.0 in | Wt 254.1 lb

## 2021-09-25 DIAGNOSIS — Z951 Presence of aortocoronary bypass graft: Secondary | ICD-10-CM | POA: Diagnosis not present

## 2021-09-25 DIAGNOSIS — I5032 Chronic diastolic (congestive) heart failure: Secondary | ICD-10-CM

## 2021-09-25 DIAGNOSIS — Z79899 Other long term (current) drug therapy: Secondary | ICD-10-CM | POA: Insufficient documentation

## 2021-09-25 DIAGNOSIS — I110 Hypertensive heart disease with heart failure: Secondary | ICD-10-CM | POA: Insufficient documentation

## 2021-09-25 DIAGNOSIS — Z794 Long term (current) use of insulin: Secondary | ICD-10-CM | POA: Diagnosis not present

## 2021-09-25 DIAGNOSIS — E119 Type 2 diabetes mellitus without complications: Secondary | ICD-10-CM | POA: Diagnosis not present

## 2021-09-25 DIAGNOSIS — Z833 Family history of diabetes mellitus: Secondary | ICD-10-CM | POA: Insufficient documentation

## 2021-09-25 DIAGNOSIS — Z87442 Personal history of urinary calculi: Secondary | ICD-10-CM | POA: Insufficient documentation

## 2021-09-25 DIAGNOSIS — I10 Essential (primary) hypertension: Secondary | ICD-10-CM

## 2021-09-25 DIAGNOSIS — Z7982 Long term (current) use of aspirin: Secondary | ICD-10-CM | POA: Insufficient documentation

## 2021-09-25 NOTE — Patient Instructions (Addendum)
Begin weighing daily and call for an overnight weight gain of 3 pounds or more or a weekly weight gain of more than 5 pounds.   If you have voicemail, please make sure your mailbox is cleaned out so that we may leave a message and please make sure to listen to any voicemails.    Bring your medications to every visit

## 2021-10-26 DIAGNOSIS — Z794 Long term (current) use of insulin: Secondary | ICD-10-CM | POA: Diagnosis not present

## 2021-10-26 DIAGNOSIS — E1159 Type 2 diabetes mellitus with other circulatory complications: Secondary | ICD-10-CM | POA: Diagnosis not present

## 2021-10-26 DIAGNOSIS — E1142 Type 2 diabetes mellitus with diabetic polyneuropathy: Secondary | ICD-10-CM | POA: Diagnosis not present

## 2021-11-21 DIAGNOSIS — I509 Heart failure, unspecified: Secondary | ICD-10-CM | POA: Diagnosis not present

## 2021-11-21 DIAGNOSIS — Z008 Encounter for other general examination: Secondary | ICD-10-CM | POA: Diagnosis not present

## 2021-11-21 DIAGNOSIS — Z6833 Body mass index (BMI) 33.0-33.9, adult: Secondary | ICD-10-CM | POA: Diagnosis not present

## 2021-11-21 DIAGNOSIS — E669 Obesity, unspecified: Secondary | ICD-10-CM | POA: Diagnosis not present

## 2021-11-21 DIAGNOSIS — E785 Hyperlipidemia, unspecified: Secondary | ICD-10-CM | POA: Diagnosis not present

## 2021-11-21 DIAGNOSIS — Z794 Long term (current) use of insulin: Secondary | ICD-10-CM | POA: Diagnosis not present

## 2021-11-21 DIAGNOSIS — E1159 Type 2 diabetes mellitus with other circulatory complications: Secondary | ICD-10-CM | POA: Diagnosis not present

## 2021-11-21 DIAGNOSIS — Z951 Presence of aortocoronary bypass graft: Secondary | ICD-10-CM | POA: Diagnosis not present

## 2021-11-21 DIAGNOSIS — I2510 Atherosclerotic heart disease of native coronary artery without angina pectoris: Secondary | ICD-10-CM | POA: Diagnosis not present

## 2021-11-21 DIAGNOSIS — E1169 Type 2 diabetes mellitus with other specified complication: Secondary | ICD-10-CM | POA: Diagnosis not present

## 2021-11-22 DIAGNOSIS — E113212 Type 2 diabetes mellitus with mild nonproliferative diabetic retinopathy with macular edema, left eye: Secondary | ICD-10-CM | POA: Diagnosis not present

## 2021-11-23 DIAGNOSIS — Z0100 Encounter for examination of eyes and vision without abnormal findings: Secondary | ICD-10-CM | POA: Diagnosis not present

## 2021-12-05 DIAGNOSIS — E119 Type 2 diabetes mellitus without complications: Secondary | ICD-10-CM | POA: Diagnosis not present

## 2021-12-05 DIAGNOSIS — I5020 Unspecified systolic (congestive) heart failure: Secondary | ICD-10-CM | POA: Diagnosis not present

## 2021-12-05 DIAGNOSIS — D649 Anemia, unspecified: Secondary | ICD-10-CM | POA: Diagnosis not present

## 2021-12-05 DIAGNOSIS — E291 Testicular hypofunction: Secondary | ICD-10-CM | POA: Diagnosis not present

## 2021-12-05 DIAGNOSIS — Z0000 Encounter for general adult medical examination without abnormal findings: Secondary | ICD-10-CM | POA: Diagnosis not present

## 2021-12-05 DIAGNOSIS — Z125 Encounter for screening for malignant neoplasm of prostate: Secondary | ICD-10-CM | POA: Diagnosis not present

## 2021-12-05 DIAGNOSIS — I25810 Atherosclerosis of coronary artery bypass graft(s) without angina pectoris: Secondary | ICD-10-CM | POA: Diagnosis not present

## 2021-12-05 DIAGNOSIS — E785 Hyperlipidemia, unspecified: Secondary | ICD-10-CM | POA: Diagnosis not present

## 2021-12-05 DIAGNOSIS — Z794 Long term (current) use of insulin: Secondary | ICD-10-CM | POA: Diagnosis not present

## 2021-12-05 DIAGNOSIS — I10 Essential (primary) hypertension: Secondary | ICD-10-CM | POA: Diagnosis not present

## 2021-12-05 DIAGNOSIS — I255 Ischemic cardiomyopathy: Secondary | ICD-10-CM | POA: Diagnosis not present

## 2022-01-01 DIAGNOSIS — E291 Testicular hypofunction: Secondary | ICD-10-CM | POA: Diagnosis not present

## 2022-01-12 DIAGNOSIS — E291 Testicular hypofunction: Secondary | ICD-10-CM | POA: Diagnosis not present

## 2022-01-22 ENCOUNTER — Telehealth: Payer: Self-pay | Admitting: Family

## 2022-01-22 NOTE — Telephone Encounter (Signed)
Patient called asking what he needed to do to renew his novartis application for entresto. I advised patient that his application expires 02/11/22 and we need him to fill out a new application and bring in proof of income as soon as possible.   Hafsah Hendler, NT

## 2022-01-26 DIAGNOSIS — E291 Testicular hypofunction: Secondary | ICD-10-CM | POA: Diagnosis not present

## 2022-01-26 DIAGNOSIS — E349 Endocrine disorder, unspecified: Secondary | ICD-10-CM | POA: Diagnosis not present

## 2022-01-29 DIAGNOSIS — E1159 Type 2 diabetes mellitus with other circulatory complications: Secondary | ICD-10-CM | POA: Diagnosis not present

## 2022-01-29 DIAGNOSIS — Z794 Long term (current) use of insulin: Secondary | ICD-10-CM | POA: Diagnosis not present

## 2022-01-29 DIAGNOSIS — E1142 Type 2 diabetes mellitus with diabetic polyneuropathy: Secondary | ICD-10-CM | POA: Diagnosis not present

## 2022-02-07 ENCOUNTER — Other Ambulatory Visit: Payer: Self-pay | Admitting: Family

## 2022-02-07 ENCOUNTER — Telehealth: Payer: Self-pay | Admitting: Family

## 2022-02-07 MED ORDER — ENTRESTO 97-103 MG PO TABS: 1.0000 | ORAL_TABLET | Freq: Two times a day (BID) | ORAL | 3 refills | Status: DC

## 2022-02-07 NOTE — Progress Notes (Signed)
Entresto RX printed for patient assistance 

## 2022-02-07 NOTE — Telephone Encounter (Signed)
Reminded patient I need proof of income and for him to come in and fill out a new application for [employer].   Scott Hale, NT

## 2022-02-09 DIAGNOSIS — E291 Testicular hypofunction: Secondary | ICD-10-CM | POA: Diagnosis not present

## 2022-02-09 DIAGNOSIS — E349 Endocrine disorder, unspecified: Secondary | ICD-10-CM | POA: Diagnosis not present

## 2022-02-23 DIAGNOSIS — E291 Testicular hypofunction: Secondary | ICD-10-CM | POA: Diagnosis not present

## 2022-02-23 DIAGNOSIS — E349 Endocrine disorder, unspecified: Secondary | ICD-10-CM | POA: Diagnosis not present

## 2022-03-09 DIAGNOSIS — E291 Testicular hypofunction: Secondary | ICD-10-CM | POA: Diagnosis not present

## 2022-03-09 DIAGNOSIS — E349 Endocrine disorder, unspecified: Secondary | ICD-10-CM | POA: Diagnosis not present

## 2022-03-23 DIAGNOSIS — E349 Endocrine disorder, unspecified: Secondary | ICD-10-CM | POA: Diagnosis not present

## 2022-03-23 DIAGNOSIS — E291 Testicular hypofunction: Secondary | ICD-10-CM | POA: Diagnosis not present

## 2022-03-24 NOTE — Progress Notes (Unsigned)
Patient ID: Scott Hale, male    DOB: September 12, 1950, 72 y.o.   MRN: [id number]  Scott Hale is a 72 y/o male with a history of diabetes, HTN and chronic heart failure.   Echo report from 03/31/20 reviewed and showed an EF of 55-60% along with mild Scott. Echo report from 04/29/17 reviewed and showed an EF of 35% along with mild Scott/TR/AR. Stress test performed 04/29/17 and showed an EF of 31% along with inferolateral T wave inversions with stress.  Has not been admitted or been in the ED in the last 6 months.    He presents today with a chief complaint of a follow-up visit.     Past Medical History:  Diagnosis Date   Arthritis    CHF (congestive heart failure) (HCC)    Diabetes mellitus without complication ([place])    Erectile dysfunction    History of kidney stones    Hypertension    Sleep apnea    Past Surgical History:  Procedure Laterality Date   CARDIAC CATHETERIZATION     COLONOSCOPY N/A 03/07/2021   Procedure: COLONOSCOPY;  Surgeon: Lesly Rubenstein, MD;  Location: [facility] ENDOSCOPY;  Service: Endoscopy;  Laterality: N/A;  IDDM   COLONOSCOPY WITH PROPOFOL N/A 04/09/2017   Procedure: COLONOSCOPY WITH PROPOFOL;  Surgeon: Jonathon Bellows, MD;  Location: [facility] ENDOSCOPY;  Service: Gastroenterology;  Laterality: N/A;   CORONARY ARTERY BYPASS GRAFT  2003   JOINT REPLACEMENT     total knee   RENAL ARTERY STENT     due to kidney stones   Family History  Problem Relation Age of Onset   Diabetes Mother    Alcoholism Father    Diabetes Sister    Diabetes Sister    Arrhythmia Sister    Diabetes Sister    Social History   Tobacco Use   Smoking status: Never   Smokeless tobacco: Never  Substance Use Topics   Alcohol use: No   No Known Allergies     Review of Systems  Constitutional:  Negative for appetite change and fatigue.  HENT:  Negative for congestion, postnasal drip and sore throat.   Eyes: Negative.   Respiratory:  Negative for cough, chest tightness and shortness of breath.    Cardiovascular:  Negative for chest pain, palpitations and leg swelling.  Gastrointestinal:  Negative for abdominal distention and abdominal pain.  Endocrine: Negative.   Genitourinary: Negative.   Musculoskeletal:  Negative for back pain and neck pain.  Skin: Negative.   Allergic/Immunologic: Negative.   Neurological:  Negative for dizziness and light-headedness.  Hematological:  Negative for adenopathy. Does not bruise/bleed easily.  Psychiatric/Behavioral:  Negative for dysphoric mood and sleep disturbance. The patient is not nervous/anxious.      Physical Exam Vitals and nursing note reviewed.  Constitutional:      Appearance: He is well-developed.  HENT:     Head: Normocephalic and atraumatic.  Neck:     Vascular: No JVD.  Cardiovascular:     Rate and Rhythm: Normal rate and regular rhythm.  Pulmonary:     Effort: Pulmonary effort is normal.     Breath sounds: No wheezing or rales.  Abdominal:     General: There is no distension.     Palpations: Abdomen is soft.     Tenderness: There is no abdominal tenderness.  Musculoskeletal:        General: No tenderness.     Cervical back: Normal range of motion and neck supple.  Skin:  General: Skin is warm and dry.  Neurological:     Mental Status: He is alert and oriented to person, place, and time.  Psychiatric:        Behavior: Behavior normal.        Thought Content: Thought content normal.   Assessment & Plan:  1: Chronic heart failure with preserved ejection fraction without structural changes- - NYHA class I - euvolemic today - weighing daily and he was reminded to call for an overnight weight gain of >2 pounds or a weekly weight gain of >5 pounds - weight 254.2 pounds from last visit here 6 months ago - going to the gym and power lifting weights daily at [employer] - not adding salt to his food. Reviewed the importance of closely following a 20108m sodium diet  - does eat out occasionally  - saw cardiology  ([facility]) 11/10/20 - BNP 03/24/17 was 185.0  2: HTN- - BP  - saw PCP (Kary Kos 12/05/21 - BMP on 12/05/21 reviewed and showed sodium 139, potassium 4.7, creatinine 1.5 and GFR 50  3: Diabetes-  - A1c on 01/29/22 was 6.5% - saw endocrinology ([facility]) 01/29/22   Patient did not bring his medications nor a list. Each medication was verbally reviewed with the patient and he was encouraged to bring the bottles to every visit to confirm accuracy of list.

## 2022-03-26 ENCOUNTER — Ambulatory Visit: Payer: No Typology Code available for payment source | Attending: Family | Admitting: Family

## 2022-03-26 ENCOUNTER — Encounter: Payer: Self-pay | Admitting: Family

## 2022-03-26 ENCOUNTER — Telehealth: Payer: Self-pay | Admitting: Family

## 2022-03-26 VITALS — BP 140/84 | HR 72 | Resp 20 | Wt 262.2 lb

## 2022-03-26 DIAGNOSIS — I110 Hypertensive heart disease with heart failure: Secondary | ICD-10-CM | POA: Diagnosis not present

## 2022-03-26 DIAGNOSIS — Z794 Long term (current) use of insulin: Secondary | ICD-10-CM

## 2022-03-26 DIAGNOSIS — I5032 Chronic diastolic (congestive) heart failure: Secondary | ICD-10-CM

## 2022-03-26 DIAGNOSIS — E119 Type 2 diabetes mellitus without complications: Secondary | ICD-10-CM | POA: Diagnosis not present

## 2022-03-26 DIAGNOSIS — I10 Essential (primary) hypertension: Secondary | ICD-10-CM

## 2022-03-26 NOTE — Telephone Encounter (Signed)
Patient approved for novartis patient assistance for entresto until 02/12/23.   Shellsea Borunda,NT

## 2022-03-26 NOTE — Patient Instructions (Addendum)
Continue weighing daily and call for an overnight weight gain of 3 pounds or more or a weekly weight gain of more than 5 pounds.   If you have voicemail, please make sure your mailbox is cleaned out so that we may leave a message and please make sure to listen to any voicemails.    If you receive a satisfaction survey regarding the [facility], please take the time to fill it out. This way we can continue to provide excellent care and make any changes that need to be made.    For your echo, go to the [facility] entrance and go to the Registration desk.

## 2022-04-06 DIAGNOSIS — E349 Endocrine disorder, unspecified: Secondary | ICD-10-CM | POA: Diagnosis not present

## 2022-04-06 DIAGNOSIS — E291 Testicular hypofunction: Secondary | ICD-10-CM | POA: Diagnosis not present

## 2022-04-11 DIAGNOSIS — E291 Testicular hypofunction: Secondary | ICD-10-CM | POA: Diagnosis not present

## 2022-04-19 ENCOUNTER — Ambulatory Visit: Admission: RE | Admit: 2022-04-19 | Payer: No Typology Code available for payment source | Source: Ambulatory Visit

## 2022-04-20 DIAGNOSIS — E291 Testicular hypofunction: Secondary | ICD-10-CM | POA: Diagnosis not present

## 2022-04-20 DIAGNOSIS — E349 Endocrine disorder, unspecified: Secondary | ICD-10-CM | POA: Diagnosis not present

## 2022-04-25 ENCOUNTER — Other Ambulatory Visit: Payer: Self-pay

## 2022-04-25 NOTE — Telephone Encounter (Signed)
Entered in error

## 2022-04-26 ENCOUNTER — Encounter: Payer: No Typology Code available for payment source | Admitting: Family

## 2022-04-27 ENCOUNTER — Telehealth: Payer: Self-pay | Admitting: Family

## 2022-04-27 NOTE — Telephone Encounter (Signed)
Pt is requesting samples of Entresto. States that his prescription is not authorized. States he needs a sample to bridge the gap. Please advise at [phone].

## 2022-04-27 NOTE — Telephone Encounter (Signed)
Medication Samples have been provided to the patient.  Drug name: Delene Loll       Strength: 49/51        Qty: 4  LOT: [id number]  Exp.Date: MAR/2025  Dosing instructions: take two tablets two times a day.   The patient has been instructed regarding the correct time, dose, and frequency of taking this medication, including desired effects and most common side effects.   Julianne Handler 3:08 PM 04/27/2022

## 2022-05-04 DIAGNOSIS — E349 Endocrine disorder, unspecified: Secondary | ICD-10-CM | POA: Diagnosis not present

## 2022-05-04 DIAGNOSIS — E291 Testicular hypofunction: Secondary | ICD-10-CM | POA: Diagnosis not present

## 2022-05-14 ENCOUNTER — Ambulatory Visit
Admission: RE | Admit: 2022-05-14 | Discharge: 2022-05-14 | Disposition: A | Discharge status: Home / Self Care | Payer: No Typology Code available for payment source | Source: Ambulatory Visit | Attending: Family | Admitting: Family

## 2022-05-14 DIAGNOSIS — I5032 Chronic diastolic (congestive) heart failure: Secondary | ICD-10-CM | POA: Diagnosis not present

## 2022-05-14 DIAGNOSIS — Z951 Presence of aortocoronary bypass graft: Secondary | ICD-10-CM | POA: Diagnosis not present

## 2022-05-14 DIAGNOSIS — I110 Hypertensive heart disease with heart failure: Secondary | ICD-10-CM | POA: Insufficient documentation

## 2022-05-14 DIAGNOSIS — I081 Rheumatic disorders of both mitral and tricuspid valves: Secondary | ICD-10-CM | POA: Diagnosis not present

## 2022-05-14 DIAGNOSIS — E119 Type 2 diabetes mellitus without complications: Secondary | ICD-10-CM | POA: Insufficient documentation

## 2022-05-14 DIAGNOSIS — I509 Heart failure, unspecified: Secondary | ICD-10-CM | POA: Diagnosis not present

## 2022-05-14 NOTE — Progress Notes (Signed)
*  PRELIMINARY RESULTS* Echocardiogram 2D Echocardiogram has been performed.  Scott Hale 05/14/2022, 12:10 PM

## 2022-05-15 LAB — ECHOCARDIOGRAM COMPLETE
AR max vel: 2.52 cm2
AV Area VTI: 2.36 cm2
AV Area mean vel: 2.51 cm2
AV Mean grad: 4 mmHg
AV Peak grad: 8 mmHg
Ao pk vel: 1.41 m/s
Area-P 1/2: 2.99 cm2
Calc EF: 36.8 %
MV M vel: 3.97 m/s
MV Peak grad: 63 mmHg
MV VTI: 1.68 cm2
Radius: 0.6 cm
S' Lateral: 3.8 cm
Single Plane A2C EF: 39.8 %
Single Plane A4C EF: 34.9 %

## 2022-05-24 ENCOUNTER — Encounter: Payer: No Typology Code available for payment source | Admitting: Family

## 2022-05-25 DIAGNOSIS — E349 Endocrine disorder, unspecified: Secondary | ICD-10-CM | POA: Diagnosis not present

## 2022-05-25 DIAGNOSIS — E291 Testicular hypofunction: Secondary | ICD-10-CM | POA: Diagnosis not present

## 2022-06-07 ENCOUNTER — Ambulatory Visit: Payer: No Typology Code available for payment source | Attending: Family | Admitting: Family

## 2022-06-07 ENCOUNTER — Encounter: Payer: Self-pay | Admitting: Family

## 2022-06-07 VITALS — BP 131/71 | HR 72 | Wt 249.6 lb

## 2022-06-07 DIAGNOSIS — E119 Type 2 diabetes mellitus without complications: Secondary | ICD-10-CM | POA: Insufficient documentation

## 2022-06-07 DIAGNOSIS — I10 Essential (primary) hypertension: Secondary | ICD-10-CM | POA: Diagnosis not present

## 2022-06-07 DIAGNOSIS — I110 Hypertensive heart disease with heart failure: Secondary | ICD-10-CM | POA: Diagnosis not present

## 2022-06-07 DIAGNOSIS — Z794 Long term (current) use of insulin: Secondary | ICD-10-CM | POA: Diagnosis not present

## 2022-06-07 DIAGNOSIS — I5022 Chronic systolic (congestive) heart failure: Secondary | ICD-10-CM

## 2022-06-07 DIAGNOSIS — Z7984 Long term (current) use of oral hypoglycemic drugs: Secondary | ICD-10-CM | POA: Diagnosis not present

## 2022-06-07 MED ORDER — DAPAGLIFLOZIN PROPANEDIOL 10 MG PO TABS: 10.0000 mg | ORAL_TABLET | Freq: Every day | ORAL | 5 refills | Status: DC

## 2022-06-07 NOTE — Progress Notes (Signed)
Patient ID: Scott Hale, male    DOB: 02-26-1950, 72 y.o.   MRN: [id number]  Primary cardiologist: Harold Hedge, MD (last seen 09/22) PCP: Jerl Mina, MD (last seen 10/23)  Scott Hale is a 72 y/o male with a history of diabetes, HTN and chronic heart failure.   Echo 05/14/22: EF 45-50% with mild LAE and mild Scott. Echo 03/31/20: EF of 55-60% along with mild Scott. Echo 04/29/17: EF of 35% along with mild Scott/TR/AR.   Stress test performed 04/29/17 and showed an EF of 31% along with inferolateral T wave inversions with stress.  Has not been admitted or been in the ED in the last 6 months.    He presents today for a follow-up visit with a chief complaint of minimal SOB with little exertion. Chronic in nature although he does feel like it has improved quite a bit. No other symptoms and specifically denies difficulty sleeping, dizziness, abdominal distention, palpitations, pedal edema, chest pain, cough, fatigue or weight gain.   He has been working hard in the gym and doing cardio as well as dancing on Saturday  nights and reports a gradual weight loss mostly over the last 3 weeks or so.   He had cancelled his previous echo 03/24 but did get it done a few weeks ago.   Past Medical History:  Diagnosis Date   Arthritis    CHF (congestive heart failure) (HCC)    Diabetes mellitus without complication (HCC)    Erectile dysfunction    History of kidney stones    Hypertension    Sleep apnea    Past Surgical History:  Procedure Laterality Date   CARDIAC CATHETERIZATION     COLONOSCOPY N/A 03/07/2021   Procedure: COLONOSCOPY;  Surgeon: Regis Bill, MD;  Location: [facility] ENDOSCOPY;  Service: Endoscopy;  Laterality: N/A;  IDDM   COLONOSCOPY WITH PROPOFOL N/A 04/09/2017   Procedure: COLONOSCOPY WITH PROPOFOL;  Surgeon: Wyline Mood, MD;  Location: [facility] ENDOSCOPY;  Service: Gastroenterology;  Laterality: N/A;   CORONARY ARTERY BYPASS GRAFT  2003   JOINT REPLACEMENT     total knee   RENAL ARTERY  STENT     due to kidney stones   Family History  Problem Relation Age of Onset   Diabetes Mother    Alcoholism Father    Diabetes Sister    Diabetes Sister    Arrhythmia Sister    Diabetes Sister    Social History   Tobacco Use   Smoking status: Never   Smokeless tobacco: Never  Substance Use Topics   Alcohol use: No   No Known Allergies   Prior to Admission medications   Medication Sig Start Date End Date Taking? Authorizing Provider  atorvastatin (LIPITOR) 40 MG tablet Take 40 mg by mouth daily. 08/07/16  Yes [provider]  dapagliflozin propanediol (FARXIGA) 10 MG TABS tablet Take 1 tablet (10 mg total) by mouth daily before breakfast. 06/07/22  Yes Clarisa Kindred A, FNP  Dulaglutide 1.5 MG/0.5ML SOPN Inject into the skin. 09/17/19  Yes [provider]  glipiZIDE (GLUCOTROL) 10 MG tablet Take 10 mg by mouth daily before breakfast.   Yes [provider]  insulin glargine (LANTUS) 100 UNIT/ML Solostar Pen Inject 80 Units into the skin at bedtime.   Yes [provider]  NON FORMULARY 386 mg 2 (two) times daily. Testosterone booster OTC product   Yes [provider]  sacubitril-valsartan (ENTRESTO) 97-103 MG Take 1 tablet by mouth 2 (two) times daily. 02/07/22  YeClarisa Kindred Jamani Bearce A, FNP  ASPIRIN 81 PO Take by mouth daily.  Patient not taking: Reported on 06/07/2022    [provider]  carvedilol (COREG) 6.25 MG tablet Take 6.25 mg by mouth 2 (two) times daily with a meal. Patient not taking: Reported on 03/26/2022    [provider]  cholecalciferol (VITAMIN D) 25 MCG (1000 UT) tablet Take 1,000 Units by mouth daily. Patient not taking: Reported on 09/25/2021    [provider]  spironolactone (ALDACTONE) 25 MG tablet Take 1 tablet (25 mg total) by mouth daily. Patient not taking: Reported on 09/25/2021 06/01/20   Delma Freeze, FNP  tadalafil (CIALIS) 5 MG tablet Take by mouth daily as needed for erectile  dysfunction. Patient not taking: Reported on 06/07/2022    [provider]  vitamin B-12 (CYANOCOBALAMIN) 500 MCG tablet Take 500 mcg by mouth daily. Patient not taking: Reported on 09/25/2021    [provider]   Review of Systems  Constitutional:  Negative for appetite change and fatigue.  HENT:  Negative for congestion, postnasal drip and sore throat.   Eyes: Negative.   Respiratory:  Positive for shortness of breath (very litlte). Negative for cough and chest tightness.   Cardiovascular:  Negative for chest pain, palpitations and leg swelling.  Gastrointestinal:  Negative for abdominal distention and abdominal pain.  Endocrine: Negative.   Genitourinary: Negative.   Musculoskeletal:  Negative for back pain and neck pain.  Skin: Negative.   Allergic/Immunologic: Negative.   Neurological:  Negative for dizziness and light-headedness.  Hematological:  Negative for adenopathy. Does not bruise/bleed easily.  Psychiatric/Behavioral:  Negative for dysphoric mood and sleep disturbance. The patient is not nervous/anxious.    Vitals:   06/07/22 1410  BP: 131/71  Pulse: 72  SpO2: 99%  Weight: 249 lb 9.6 oz (113.2 kg)   Wt Readings from Last 3 Encounters:  06/07/22 249 lb 9.6 oz (113.2 kg)  03/26/22 262 lb 4 oz (119 kg)  09/25/21 254 lb 2 oz (115.3 kg)   Lab Results  Component Value Date   CREATININE 1.14 12/15/2018   CREATININE 1.13 04/22/2018   CREATININE 1.32 (H) 09/09/2017   Physical Exam Vitals and nursing note reviewed.  Constitutional:      Appearance: He is well-developed.  HENT:     Head: Normocephalic and atraumatic.  Neck:     Vascular: No JVD.  Cardiovascular:     Rate and Rhythm: Normal rate and regular rhythm.  Pulmonary:     Effort: Pulmonary effort is normal.     Breath sounds: No wheezing or rales.  Abdominal:     General: There is no distension.     Palpations: Abdomen is soft.     Tenderness: There is no abdominal tenderness.   Musculoskeletal:        General: No tenderness.     Cervical back: Normal range of motion and neck supple.  Skin:    General: Skin is warm and dry.  Neurological:     Mental Status: He is alert and oriented to person, place, and time.  Psychiatric:        Behavior: Behavior normal.        Thought Content: Thought content normal.   Assessment & Plan:  1: Chronic heart failure with mildly reduced ejection fraction- - NYHA class II - euvolemic today - reviewed echo results with patient - weighing daily and he was reminded to call for an overnight weight gain of >2 pounds or  a weekly weight gain of >5 pounds - weight down 12.8 pounds from last visit here 2 months ago - going to the gym and power lifting weights daily at [employer] as well as doing cardio at the gym - not adding salt to his food. Reviewed the importance of closely following a  sodium diet  - does eat out occasionally  - saw cardiology (Fath) 09/22; discussed getting a cardiology appt scheduled - continue entresto 97/103mg  BID - begin farxiga  daily; 30 day voucher provided - BMP next visit - discussed resuming spironolactone at next visit - BNP 03/24/17 was 185.0  2: HTN- - BP 131/71 - saw PCP Burnett Sheng) 10/23 - BMP on 12/05/21 reviewed and showed sodium 139, potassium 4.7, creatinine 1.5 and GFR 50  3: Diabetes-  - A1c on 01/29/22 was 6.5% - saw endocrinology (Solum) 12/23  Return in 3 weeks, sooner if needed

## 2022-06-07 NOTE — Patient Instructions (Signed)
Start farxiga as 1 tablet every day

## 2022-06-08 DIAGNOSIS — E349 Endocrine disorder, unspecified: Secondary | ICD-10-CM | POA: Diagnosis not present

## 2022-06-08 DIAGNOSIS — E291 Testicular hypofunction: Secondary | ICD-10-CM | POA: Diagnosis not present

## 2022-06-22 DIAGNOSIS — E349 Endocrine disorder, unspecified: Secondary | ICD-10-CM | POA: Diagnosis not present

## 2022-06-22 DIAGNOSIS — E291 Testicular hypofunction: Secondary | ICD-10-CM | POA: Diagnosis not present

## 2022-06-28 ENCOUNTER — Telehealth (HOSPITAL_COMMUNITY): Payer: Self-pay

## 2022-06-28 ENCOUNTER — Other Ambulatory Visit (HOSPITAL_COMMUNITY): Payer: Self-pay

## 2022-06-28 ENCOUNTER — Encounter: Payer: Self-pay | Admitting: Family

## 2022-06-28 ENCOUNTER — Ambulatory Visit: Payer: No Typology Code available for payment source | Attending: Family | Admitting: Family

## 2022-06-28 VITALS — BP 124/69 | HR 76 | Wt 252.2 lb

## 2022-06-28 DIAGNOSIS — Z794 Long term (current) use of insulin: Secondary | ICD-10-CM | POA: Insufficient documentation

## 2022-06-28 DIAGNOSIS — I5022 Chronic systolic (congestive) heart failure: Secondary | ICD-10-CM

## 2022-06-28 DIAGNOSIS — Z79899 Other long term (current) drug therapy: Secondary | ICD-10-CM | POA: Insufficient documentation

## 2022-06-28 DIAGNOSIS — Z7984 Long term (current) use of oral hypoglycemic drugs: Secondary | ICD-10-CM | POA: Diagnosis not present

## 2022-06-28 DIAGNOSIS — Z7985 Long-term (current) use of injectable non-insulin antidiabetic drugs: Secondary | ICD-10-CM | POA: Insufficient documentation

## 2022-06-28 DIAGNOSIS — R5383 Other fatigue: Secondary | ICD-10-CM | POA: Insufficient documentation

## 2022-06-28 DIAGNOSIS — I110 Hypertensive heart disease with heart failure: Secondary | ICD-10-CM | POA: Diagnosis not present

## 2022-06-28 DIAGNOSIS — I428 Other cardiomyopathies: Secondary | ICD-10-CM | POA: Insufficient documentation

## 2022-06-28 DIAGNOSIS — E119 Type 2 diabetes mellitus without complications: Secondary | ICD-10-CM | POA: Diagnosis not present

## 2022-06-28 DIAGNOSIS — I10 Essential (primary) hypertension: Secondary | ICD-10-CM | POA: Diagnosis not present

## 2022-06-28 MED ORDER — CARVEDILOL 3.125 MG PO TABS: 3.1250 mg | ORAL_TABLET | Freq: Two times a day (BID) | ORAL | 3 refills | Status: DC

## 2022-06-28 NOTE — Patient Instructions (Addendum)
Come to the [facility] one day next week and get your lab work drawn. Go to the registration desk first and they will get you to the lab   Begin carvedilol as 1 tablet in the morning and 1 tablet in the evening.

## 2022-06-28 NOTE — Progress Notes (Signed)
[facility] HEART FAILURE CLINIC - Pharmacist Note  Scott Hale is a 72 y.o. male with HFmrEF (EF 41-49%) presenting to the Heart Failure Clinic for follow up. He reports no issues with current regimen. He was able to start Marcelline Deist since last visit but will need patient assistance set up. Patient advocate notified and samples x3 weeks provided in clinic today. Patient is not checking weight at home as he does not have a scale at home. Scale provided to patient in clinic today. He reports good appetite and adequate fluid intake. He denies any signs or symptoms of volume overload. Patient reports having no organizational system for his medications. Patient could benefit from implementation of an organizational system for medications as he has struggled to keep up with his medications in the past. Pillbox with AM/PM compartments provided to patient in clinic today.   Recent ED Visit (past 6 months): none  Guideline-Directed Medical Therapy/Evidence Based Medicine ACE/ARB/ARNI: Sacubitril/valsartan 97/103 mg BID Beta Blocker:  none Aldosterone Antagonist: Spironolactone 25 mg daily Diuretic:  none SGLT2i: Dapagliflozin 10 mg daily  Adherence Assessment Do you ever forget to take your medication? [x] Yes [] No  Do you ever skip doses due to side effects? [] Yes [x] No  Do you have trouble affording your medicines? [] Yes [x] No  Are you ever unable to pick up your medication due to transportation difficulties? [] Yes [x] No  Do you ever stop taking your medications because you don't believe they are helping? [] Yes [x] No  Do you check your weight daily? [] Yes [x] No (did not have scale)  Adherence strategy: none, provided pillbox in clinic today  Barriers to obtaining medications: needs assistance with Farxiga   Diagnostics ECHO: Date 05/14/2022, EF 45-50%, no RWMA, G3DD  Vitals    06/07/2022    2:10 PM 03/26/2022   12:55 PM 09/25/2021   12:53 PM  Vitals with BMI  Height   6\' 0"   Weight 249 lbs 10 oz  262 lbs 4 oz 254 lbs 2 oz  BMI   34.46  Systolic 131 140 161  Diastolic 71 84 86  Pulse 72 72 77     Recent Labs    Latest Ref Rng & Units 12/15/2018    1:26 PM 04/22/2018    3:42 PM 09/09/2017    1:17 PM  BMP  Glucose 70 - 99 mg/dL 99  096  045   BUN 8 - 23 mg/dL 13  12  10    Creatinine 0.61 - 1.24 mg/dL 4.09  8.11  9.14   Sodium 135 - 145 mmol/L 138  137  139   Potassium 3.5 - 5.1 mmol/L 4.3  4.3  4.6   Chloride 98 - 111 mmol/L 102  102  104   CO2 22 - 32 mmol/L 27  26  26    Calcium 8.9 - 10.3 mg/dL 9.3  9.4  9.5     Past Medical History Past Medical History:  Diagnosis Date   Arthritis    CHF (congestive heart failure) (HCC)    Diabetes mellitus without complication (HCC)    Erectile dysfunction    History of kidney stones    Hypertension    Sleep apnea     Plan Continue regimen per NP Resume carvedilol 3.125 mg twice daily Begin daily weights, scale provided in clinic Consider use of pillbox (provided in clinic)  Time spent: 10 minutes  Celene Squibb, PharmD PGY1 Pharmacy Resident 06/28/2022 2:45 PM

## 2022-06-28 NOTE — Telephone Encounter (Signed)
Advanced Heart Failure Patient Advocate Encounter  The patient was conditionally approved for a grant via [employer]. Patient will receive a conditional approval letter within 5-7 business days and contact me with any additional concerns.  BIN: [id number] PCN: AS GROUP: [id number] ID: [id number]  Burnell Blanks, CPhT Rx Patient Advocate Phone: [phone]

## 2022-06-28 NOTE — Progress Notes (Signed)
PCP: Jerl Mina, MD (last seen 10/23) Primary Cardiologist: Harold Hedge, MD (last seen 09/22)  HPI:  [name] is a 72 y/o male with a history of diabetes, HTN and chronic heart failure.   Echo 05/14/22: EF 45-50% with mild LAE and mild MR. Echo 03/31/20: EF of 55-60% along with mild MR. Echo 04/29/17: EF of 35% along with mild MR/TR/AR.   Stress test 04/29/17: EF of 31% along with inferolateral T wave inversions with stress.  Has not been admitted or been in the ED in the last 6 months.    He presents today for a follow-up visit with a chief complaint of minimal fatigue with moderate exertion. Chronic in nature although only occurs intermittently. Has no other symptoms to report. Does mention that he will not be able to afford farxiga. Getting entresto through [employer] patient assistance.   He has been working hard in [employer] and doing cardio as well as dancing on Saturday nights. Not weighing as he says that he doesn't have any scales.   ROS: All systems negative except as listed in HPI, PMH and Problem List.  SH:  Social History   Socioeconomic History   Marital status: Single    Spouse name: Not on file   Number of children: 5   Years of education: 12   Highest education level: 12th grade  Occupational History   Occupation: retired  Tobacco Use   Smoking status: Never   Smokeless tobacco: Never  Vaping Use   Vaping Use: Never used  Substance and Sexual Activity   Alcohol use: No   Drug use: No   Sexual activity: Yes    Birth control/protection: Condom  Other Topics Concern   Not on file  Social History Narrative   Not on file   Social Determinants of Health   Financial Resource Strain: Medium Risk (03/26/2017)   Overall Financial Resource Strain (CARDIA)    Difficulty of Paying Living Expenses: Somewhat hard  Food Insecurity: Food Insecurity Present (03/26/2017)   Hunger Vital Sign    Worried About Running Out of Food in the Last Year: Sometimes true    Ran Out  of Food in the Last Year: Never true  Transportation Needs: No Transportation Needs (03/26/2017)   PRAPARE - Administrator, Civil Service (Medical): No    Lack of Transportation (Non-Medical): No  Physical Activity: Sufficiently Active (03/26/2017)   Exercise Vital Sign    Days of Exercise per Week: 7 days    Minutes of Exercise per Session: 60 min  Stress: No Stress Concern Present (03/26/2017)   [employer] of Occupational Health - Occupational Stress Questionnaire    Feeling of Stress : Only a little  Social Connections: Moderately Integrated (03/26/2017)   Social Connection and Isolation Panel [NHANES]    Frequency of Communication with Friends and Family: More than three times a week    Frequency of Social Gatherings with Friends and Family: Once a week    Attends Religious Services: 1 to 4 times per year    Active Member of [employer] or Organizations: Yes    Attends Banker Meetings: Never    Marital Status: Never married  Intimate Partner Violence: Unknown (03/26/2017)   Humiliation, Afraid, Rape, and Kick questionnaire    Fear of Current or Ex-Partner: Patient declined    Emotionally Abused: Patient declined    Physically Abused: Patient declined    Sexually Abused: Patient declined    FH:  Family History  Problem Relation Age of Onset   Diabetes Mother    Alcoholism Father    Diabetes Sister    Diabetes Sister    Arrhythmia Sister    Diabetes Sister     Past Medical History:  Diagnosis Date   Arthritis    CHF (congestive heart failure) (HCC)    Diabetes mellitus without complication (HCC)    Erectile dysfunction    History of kidney stones    Hypertension    Sleep apnea     Current Outpatient Medications  Medication Sig Dispense Refill   ASPIRIN 81 PO Take by mouth daily.  (Patient not taking: Reported on 06/07/2022)     atorvastatin (LIPITOR) 40 MG tablet Take 40 mg by mouth daily.     carvedilol (COREG) 6.25 MG tablet Take 6.25 mg  by mouth 2 (two) times daily with a meal. (Patient not taking: Reported on 03/26/2022)     cholecalciferol (VITAMIN D) 25 MCG (1000 UT) tablet Take 1,000 Units by mouth daily. (Patient not taking: Reported on 09/25/2021)     dapagliflozin propanediol (FARXIGA) 10 MG TABS tablet Take 1 tablet (10 mg total) by mouth daily before breakfast. 30 tablet 5   Dulaglutide 1.5 MG/0.5ML SOPN Inject into the skin.     glipiZIDE (GLUCOTROL) 10 MG tablet Take 10 mg by mouth daily before breakfast.     insulin glargine (LANTUS) 100 UNIT/ML Solostar Pen Inject 80 Units into the skin at bedtime.     NON FORMULARY 386 mg 2 (two) times daily. Testosterone booster OTC product     sacubitril-valsartan (ENTRESTO) 97-103 MG Take 1 tablet by mouth 2 (two) times daily. 180 tablet 3   spironolactone (ALDACTONE) 25 MG tablet Take 1 tablet (25 mg total) by mouth daily. (Patient not taking: Reported on 09/25/2021) 30 tablet 5   tadalafil (CIALIS) 5 MG tablet Take by mouth daily as needed for erectile dysfunction. (Patient not taking: Reported on 06/07/2022)     vitamin B-12 (CYANOCOBALAMIN) 500 MCG tablet Take 500 mcg by mouth daily. (Patient not taking: Reported on 09/25/2021)     No current facility-administered medications for this visit.   Vitals:   06/28/22 1446  BP: 124/69  Pulse: 76  SpO2: 98%  Weight: 252 lb 4 oz (114.4 kg)   Wt Readings from Last 3 Encounters:  06/28/22 252 lb 4 oz (114.4 kg)  06/07/22 249 lb 9.6 oz (113.2 kg)  03/26/22 262 lb 4 oz (119 kg)   Lab Results  Component Value Date   CREATININE 1.14 12/15/2018   CREATININE 1.13 04/22/2018   CREATININE 1.32 (H) 09/09/2017   PHYSICAL EXAM:  General:  Well appearing. No resp difficulty HEENT: normal Neck: supple. JVP flat. No lymphadenopathy or thryomegaly appreciated. Cor: PMI normal. Regular rate & rhythm. No rubs, gallops or murmurs. Lungs: clear Abdomen: soft, nontender, nondistended. No hepatosplenomegaly. No bruits or masses.   Extremities: no cyanosis, clubbing, rash, edema Neuro: alert & oriented x3, cranial nerves grossly intact. Moves all 4 extremities w/o difficulty. Affect pleasant.   ECG: not done   ASSESSMENT & PLAN:  1: NICM with mildly reduced ejection fraction- - NYHA class II - euvolemic today - etiology likely d/t HTN - scales provided; instructed to weigh daily and to call for an overnight weight gain of >2 pounds or a weekly weight gain of >5 pounds - weight up 3 pounds from last visit here 3 weeks ago - going to the gym and power lifting weights daily at [employer] as  well as doing cardio at the gym - goes out dancing on Saturday nights - not adding salt to his food. Reviewed the importance of closely following a 2000mg  sodium diet  - does eat out occasionally  - saw cardiology (Fath) 09/22; explained that he needed to call [facility] cardiology and get f/u scheduled.  - continue entresto 97/103mg  BID - continue farxiga 10mg  daily - begin carvedilol 3.125mg  BID - BMP next week as he says that he can't stay today to get the labs done - consider resuming spironolactone at next visit - BNP 03/24/17 was 185.0 - PharmD reconciled meds w/ patient  2: HTN- - BP 124/69 - saw PCP Burnett Sheng) 10/23 - BMP on 12/05/21 reviewed and showed sodium 139, potassium 4.7, creatinine 1.5 and GFR 50  3: Diabetes-  - A1c on 01/29/22 was 6.5% - saw endocrinology (Solum) 12/23  Pill box provided to patient today  Return in 1 month, sooner if needed.

## 2022-07-05 ENCOUNTER — Other Ambulatory Visit
Admission: RE | Admit: 2022-07-05 | Discharge: 2022-07-05 | Disposition: A | Discharge status: Home / Self Care | Payer: No Typology Code available for payment source | Source: Ambulatory Visit | Attending: Family | Admitting: Family

## 2022-07-05 DIAGNOSIS — I5022 Chronic systolic (congestive) heart failure: Secondary | ICD-10-CM | POA: Diagnosis not present

## 2022-07-05 LAB — BASIC METABOLIC PANEL
Anion gap: 8 (ref 5–15)
BUN: 14 mg/dL (ref 8–23)
CO2: 24 mmol/L (ref 22–32)
Calcium: 8.8 mg/dL — ABNORMAL LOW (ref 8.9–10.3)
Chloride: 105 mmol/L (ref 98–111)
Creatinine, Ser: 1.77 mg/dL — ABNORMAL HIGH (ref 0.61–1.24)
GFR, Estimated: 41 mL/min — ABNORMAL LOW (ref >60)
Glucose, Bld: 118 mg/dL — ABNORMAL HIGH (ref 70–99)
Potassium: 3.8 mmol/L (ref 3.5–5.1)
Sodium: 137 mmol/L (ref 135–145)

## 2022-07-06 DIAGNOSIS — E349 Endocrine disorder, unspecified: Secondary | ICD-10-CM | POA: Diagnosis not present

## 2022-07-06 DIAGNOSIS — E291 Testicular hypofunction: Secondary | ICD-10-CM | POA: Diagnosis not present

## 2022-07-20 DIAGNOSIS — E291 Testicular hypofunction: Secondary | ICD-10-CM | POA: Diagnosis not present

## 2022-07-20 DIAGNOSIS — E349 Endocrine disorder, unspecified: Secondary | ICD-10-CM | POA: Diagnosis not present

## 2022-08-03 DIAGNOSIS — E349 Endocrine disorder, unspecified: Secondary | ICD-10-CM | POA: Diagnosis not present

## 2022-08-03 DIAGNOSIS — E291 Testicular hypofunction: Secondary | ICD-10-CM | POA: Diagnosis not present

## 2022-08-07 ENCOUNTER — Encounter: Payer: No Typology Code available for payment source | Admitting: Family

## 2022-08-07 ENCOUNTER — Telehealth: Payer: Self-pay | Admitting: Family

## 2022-08-07 NOTE — Telephone Encounter (Signed)
Patient did not show for his Heart Failure Clinic appointment on 08/07/22.

## 2022-08-07 NOTE — Progress Notes (Deleted)
PCP: Jerl Mina, MD (last seen 10/23) Primary Cardiologist: Harold Hedge, MD (last seen 09/22)  HPI:  Scott Hale is a 72 y/o male with a history of diabetes, HTN and chronic heart failure.   Echo 05/14/22: EF 45-50% with mild LAE and mild Scott. Echo 03/31/20: EF of 55-60% along with mild Scott. Echo 04/29/17: EF of 35% along with mild Scott/TR/AR.   Stress test 04/29/17: EF of 31% along with inferolateral T wave inversions with stress.  Has not been admitted or been in the ED in the last 6 months.    He presents today for a follow-up visit with a chief complaint of     ROS: All systems negative except as listed in HPI, PMH and Problem List.  SH:  Social History   Socioeconomic History   Marital status: Single    Spouse name: Not on file   Number of children: 5   Years of education: 12   Highest education level: 12th grade  Occupational History   Occupation: retired  Tobacco Use   Smoking status: Never   Smokeless tobacco: Never  Vaping Use   Vaping Use: Never used  Substance and Sexual Activity   Alcohol use: No   Drug use: No   Sexual activity: Yes    Birth control/protection: Condom  Other Topics Concern   Not on file  Social History Narrative   Not on file   Social Determinants of Health   Financial Resource Strain: Medium Risk (03/26/2017)   Overall Financial Resource Strain (CARDIA)    Difficulty of Paying Living Expenses: Somewhat hard  Food Insecurity: Food Insecurity Present (03/26/2017)   Hunger Vital Sign    Worried About Running Out of Food in the Last Year: Sometimes true    Ran Out of Food in the Last Year: Never true  Transportation Needs: No Transportation Needs (03/26/2017)   PRAPARE - Administrator, Civil Service (Medical): No    Lack of Transportation (Non-Medical): No  Physical Activity: Sufficiently Active (03/26/2017)   Exercise Vital Sign    Days of Exercise per Week: 7 days    Minutes of Exercise per Session: 60 min  Stress: No Stress  Concern Present (03/26/2017)   [employer] of Occupational Health - Occupational Stress Questionnaire    Feeling of Stress : Only a little  Social Connections: Moderately Integrated (03/26/2017)   Social Connection and Isolation Panel [NHANES]    Frequency of Communication with Friends and Family: More than three times a week    Frequency of Social Gatherings with Friends and Family: Once a week    Attends Religious Services: 1 to 4 times per year    Active Member of [employer] or Organizations: Yes    Attends Banker Meetings: Never    Marital Status: Never married  Intimate Partner Violence: Unknown (03/26/2017)   Humiliation, Afraid, Rape, and Kick questionnaire    Fear of Current or Ex-Partner: Patient declined    Emotionally Abused: Patient declined    Physically Abused: Patient declined    Sexually Abused: Patient declined    FH:  Family History  Problem Relation Age of Onset   Diabetes Mother    Alcoholism Father    Diabetes Sister    Diabetes Sister    Arrhythmia Sister    Diabetes Sister     Past Medical History:  Diagnosis Date   Arthritis    CHF (congestive heart failure) (HCC)    Diabetes mellitus without complication (HCC)  Erectile dysfunction    History of kidney stones    Hypertension    Sleep apnea     Current Outpatient Medications  Medication Sig Dispense Refill   ASPIRIN 81 PO Take by mouth daily.     atorvastatin (LIPITOR) 40 MG tablet Take 40 mg by mouth daily.     carvedilol (COREG) 3.125 MG tablet Take 1 tablet (3.125 mg total) by mouth 2 (two) times daily. 180 tablet 3   cholecalciferol (VITAMIN D) 25 MCG (1000 UT) tablet Take 1,000 Units by mouth daily. (Patient not taking: Reported on 09/25/2021)     dapagliflozin propanediol (FARXIGA) 10 MG TABS tablet Take 1 tablet (10 mg total) by mouth daily before breakfast. 30 tablet 5   Dulaglutide 1.5 MG/0.5ML SOPN Inject 1.5 mg into the skin once a week. Mondays     glipiZIDE (GLUCOTROL)  10 MG tablet Take 10 mg by mouth daily before breakfast.     insulin glargine (LANTUS) 100 UNIT/ML Solostar Pen Inject 80 Units into the skin at bedtime.     NON FORMULARY 386 mg 2 (two) times daily. Testosterone booster OTC product     sacubitril-valsartan (ENTRESTO) 97-103 MG Take 1 tablet by mouth 2 (two) times daily. 180 tablet 3   spironolactone (ALDACTONE) 25 MG tablet Take 1 tablet (25 mg total) by mouth daily. 30 tablet 5   tadalafil (CIALIS) 5 MG tablet Take by mouth daily as needed for erectile dysfunction.     vitamin B-12 (CYANOCOBALAMIN) 500 MCG tablet Take 500 mcg by mouth daily.     No current facility-administered medications for this visit.     PHYSICAL EXAM:  General:  Well appearing. No resp difficulty HEENT: normal Neck: supple. JVP flat. No lymphadenopathy or thryomegaly appreciated. Cor: PMI normal. Regular rate & rhythm. No rubs, gallops or murmurs. Lungs: clear Abdomen: soft, nontender, nondistended. No hepatosplenomegaly. No bruits or masses.  Extremities: no cyanosis, clubbing, rash, edema Neuro: alert & oriented x3, cranial nerves grossly intact. Moves all 4 extremities w/o difficulty. Affect pleasant.   ECG: not done   ASSESSMENT & PLAN:  1: NICM with mildly reduced ejection fraction- - NYHA class II - euvolemic today - etiology likely d/t HTN - scales provided; instructed to weigh daily and to call for an overnight weight gain of >2 pounds or a weekly weight gain of >5 pounds - weight 252.4 pounds from last visit here 6 weeks ago - going to the gym and power lifting weights daily at [employer] as well as doing cardio at the gym - goes out dancing on Saturday nights - not adding salt to his food. Reviewed the importance of closely following a 2000mg  sodium diet  - does eat out occasionally  - saw cardiology (Fath) 09/22; explained that he needed to call [facility] cardiology and get f/u scheduled.  - continue entresto 97/103mg  BID - continue  farxiga 10mg  daily - continue carvedilol 3.125mg  BID - consider resuming spironolactone at next visit - BNP 03/24/17 was 185.0 - PharmD reconciled meds w/ patient  2: HTN- - BP  - saw PCP Scott Hale) 10/23 - BMP 07/05/22 reviewed and showed sodium 137, potassium 3.8, creatinine 1.77 and GFR 41  3: Diabetes-  - A1c on 01/29/22 was 6.5% - saw endocrinology (Solum) 12/23

## 2022-08-17 DIAGNOSIS — E349 Endocrine disorder, unspecified: Secondary | ICD-10-CM | POA: Diagnosis not present

## 2022-08-17 DIAGNOSIS — E291 Testicular hypofunction: Secondary | ICD-10-CM | POA: Diagnosis not present

## 2022-08-20 DIAGNOSIS — E1159 Type 2 diabetes mellitus with other circulatory complications: Secondary | ICD-10-CM | POA: Diagnosis not present

## 2022-08-20 DIAGNOSIS — E1142 Type 2 diabetes mellitus with diabetic polyneuropathy: Secondary | ICD-10-CM | POA: Diagnosis not present

## 2022-08-20 DIAGNOSIS — Z794 Long term (current) use of insulin: Secondary | ICD-10-CM | POA: Diagnosis not present

## 2022-08-31 DIAGNOSIS — E349 Endocrine disorder, unspecified: Secondary | ICD-10-CM | POA: Diagnosis not present

## 2022-08-31 DIAGNOSIS — E291 Testicular hypofunction: Secondary | ICD-10-CM | POA: Diagnosis not present

## 2022-09-14 DIAGNOSIS — E349 Endocrine disorder, unspecified: Secondary | ICD-10-CM | POA: Diagnosis not present

## 2022-09-14 DIAGNOSIS — E291 Testicular hypofunction: Secondary | ICD-10-CM | POA: Diagnosis not present

## 2022-09-28 DIAGNOSIS — E349 Endocrine disorder, unspecified: Secondary | ICD-10-CM | POA: Diagnosis not present

## 2022-09-28 DIAGNOSIS — E291 Testicular hypofunction: Secondary | ICD-10-CM | POA: Diagnosis not present

## 2022-10-12 DIAGNOSIS — E291 Testicular hypofunction: Secondary | ICD-10-CM | POA: Diagnosis not present

## 2022-10-12 DIAGNOSIS — E349 Endocrine disorder, unspecified: Secondary | ICD-10-CM | POA: Diagnosis not present

## 2022-10-19 DIAGNOSIS — Z008 Encounter for other general examination: Secondary | ICD-10-CM | POA: Diagnosis not present

## 2022-10-19 DIAGNOSIS — I2510 Atherosclerotic heart disease of native coronary artery without angina pectoris: Secondary | ICD-10-CM | POA: Diagnosis not present

## 2022-10-19 DIAGNOSIS — E261 Secondary hyperaldosteronism: Secondary | ICD-10-CM | POA: Diagnosis not present

## 2022-10-19 DIAGNOSIS — I5020 Unspecified systolic (congestive) heart failure: Secondary | ICD-10-CM | POA: Diagnosis not present

## 2022-10-19 DIAGNOSIS — E785 Hyperlipidemia, unspecified: Secondary | ICD-10-CM | POA: Diagnosis not present

## 2022-10-19 DIAGNOSIS — I110 Hypertensive heart disease with heart failure: Secondary | ICD-10-CM | POA: Diagnosis not present

## 2022-10-19 DIAGNOSIS — Z6835 Body mass index (BMI) 35.0-35.9, adult: Secondary | ICD-10-CM | POA: Diagnosis not present

## 2022-10-27 ENCOUNTER — Other Ambulatory Visit: Payer: Self-pay

## 2022-10-27 ENCOUNTER — Emergency Department: Payer: No Typology Code available for payment source

## 2022-10-27 DIAGNOSIS — M79605 Pain in left leg: Secondary | ICD-10-CM | POA: Diagnosis not present

## 2022-10-27 DIAGNOSIS — R600 Localized edema: Secondary | ICD-10-CM | POA: Insufficient documentation

## 2022-10-27 DIAGNOSIS — R0602 Shortness of breath: Secondary | ICD-10-CM | POA: Insufficient documentation

## 2022-10-27 DIAGNOSIS — E119 Type 2 diabetes mellitus without complications: Secondary | ICD-10-CM | POA: Insufficient documentation

## 2022-10-27 DIAGNOSIS — I509 Heart failure, unspecified: Secondary | ICD-10-CM | POA: Diagnosis not present

## 2022-10-27 DIAGNOSIS — I110 Hypertensive heart disease with heart failure: Secondary | ICD-10-CM | POA: Diagnosis not present

## 2022-10-27 DIAGNOSIS — M7989 Other specified soft tissue disorders: Secondary | ICD-10-CM | POA: Diagnosis not present

## 2022-10-27 DIAGNOSIS — R0601 Orthopnea: Secondary | ICD-10-CM | POA: Insufficient documentation

## 2022-10-27 DIAGNOSIS — J9811 Atelectasis: Secondary | ICD-10-CM | POA: Diagnosis not present

## 2022-10-27 DIAGNOSIS — J40 Bronchitis, not specified as acute or chronic: Secondary | ICD-10-CM | POA: Diagnosis not present

## 2022-10-27 LAB — CBC
HCT: 42.3 % (ref 39.0–52.0)
Hemoglobin: 13.4 g/dL (ref 13.0–17.0)
MCH: 28.1 pg (ref 26.0–34.0)
MCHC: 31.7 g/dL (ref 30.0–36.0)
MCV: 88.7 fL (ref 80.0–100.0)
Platelets: 205 10*3/uL (ref 150–400)
RBC: 4.77 MIL/uL (ref 4.22–5.81)
RDW: 17 % — ABNORMAL HIGH (ref 11.5–15.5)
WBC: 7.5 10*3/uL (ref 4.0–10.5)
nRBC: 0 % (ref 0.0–0.2)

## 2022-10-27 LAB — COMPREHENSIVE METABOLIC PANEL
ALT: 98 U/L — ABNORMAL HIGH (ref 0–44)
AST: 58 U/L — ABNORMAL HIGH (ref 15–41)
Albumin: 3.6 g/dL (ref 3.5–5.0)
Alkaline Phosphatase: 124 U/L (ref 38–126)
Anion gap: 6 (ref 5–15)
BUN: 22 mg/dL (ref 8–23)
CO2: 26 mmol/L (ref 22–32)
Calcium: 8.7 mg/dL — ABNORMAL LOW (ref 8.9–10.3)
Chloride: 102 mmol/L (ref 98–111)
Creatinine, Ser: 1.45 mg/dL — ABNORMAL HIGH (ref 0.61–1.24)
GFR, Estimated: 52 mL/min — ABNORMAL LOW (ref >60)
Glucose, Bld: 124 mg/dL — ABNORMAL HIGH (ref 70–99)
Potassium: 4.5 mmol/L (ref 3.5–5.1)
Sodium: 134 mmol/L — ABNORMAL LOW (ref 135–145)
Total Bilirubin: 1.1 mg/dL (ref 0.3–1.2)
Total Protein: 6.8 g/dL (ref 6.5–8.1)

## 2022-10-27 NOTE — ED Triage Notes (Signed)
Pt reports he is having shortness of breath for about a week. Pt reports he has mold at his apartment. Pt talks in complete sentences no respiratory distress noted

## 2022-10-28 ENCOUNTER — Emergency Department
Admission: EM | Admit: 2022-10-28 | Discharge: 2022-10-28 | Disposition: A | Discharge status: Home / Self Care | Payer: No Typology Code available for payment source | Attending: Emergency Medicine | Admitting: Emergency Medicine

## 2022-10-28 ENCOUNTER — Emergency Department: Payer: No Typology Code available for payment source

## 2022-10-28 DIAGNOSIS — R600 Localized edema: Secondary | ICD-10-CM

## 2022-10-28 DIAGNOSIS — M79605 Pain in left leg: Secondary | ICD-10-CM | POA: Diagnosis not present

## 2022-10-28 DIAGNOSIS — M7989 Other specified soft tissue disorders: Secondary | ICD-10-CM | POA: Diagnosis not present

## 2022-10-28 LAB — BRAIN NATRIURETIC PEPTIDE: B Natriuretic Peptide: 3032.9 pg/mL — ABNORMAL HIGH (ref 0.0–100.0)

## 2022-10-28 LAB — TROPONIN I (HIGH SENSITIVITY): Troponin I (High Sensitivity): 13 ng/L (ref <18)

## 2022-10-28 MED ORDER — FUROSEMIDE 20 MG PO TABS: 20.0000 mg | ORAL_TABLET | Freq: Every day | ORAL | 0 refills | Status: DC

## 2022-10-28 MED ORDER — TORSEMIDE 20 MG PO TABS
40.0000 mg | ORAL_TABLET | ORAL | Status: AC
  Administered 2022-10-28: 40 mg via ORAL
  Filled 2022-10-28: qty 2

## 2022-10-28 MED ORDER — TORSEMIDE 20 MG PO TABS: 40.0000 mg | ORAL_TABLET | Freq: Every day | ORAL | Status: DC

## 2022-10-28 NOTE — ED Notes (Signed)
Patient ambulating to the bathroom without assistance or signs of distress.

## 2022-10-28 NOTE — ED Notes (Signed)
Pt sitting on the side of the bed. No complaints of pain or signs of distress.

## 2022-10-28 NOTE — ED Notes (Signed)
Patient ambulating to the bathroom without assistance - no signs of distress at this time.

## 2022-10-28 NOTE — ED Provider Notes (Signed)
[facility] Provider Note    Event Date/Time   First MD Initiated Contact with Patient 10/28/22 0247     (approximate)   History   Chief Complaint: Shortness of Breath   HPI  Scott Hale is a 72 y.o. male with a history of hypertension, diabetes, morbid obesity, CHF who comes ED complaining of shortness of breath for a week, worse with exertion.  Endorses orthopnea.  No chest pain or fever.     Physical Exam   Triage Vital Signs: ED Triage Vitals  Encounter Vitals Group     BP 10/27/22 2205 (!) 145/96     Systolic BP Percentile --      Diastolic BP Percentile --      Pulse Rate 10/27/22 2205 80     Resp 10/27/22 2205 16     Temp 10/27/22 2205 98.1 F (36.7 C)     Temp Source 10/27/22 2205 Oral     SpO2 10/27/22 2205 100 %     Weight 10/27/22 2206 260 lb (117.9 kg)     Height 10/27/22 2206 6' (1.829 m)     Head Circumference --      Peak Flow --      Pain Score 10/27/22 2205 10     Pain Loc --      Pain Education --      Exclude from Growth Chart --     Most recent vital signs: Vitals:   10/27/22 2205 10/28/22 0436  BP: (!) 145/96 (!) 137/101  Pulse: 80 81  Resp: 16 20  Temp: 98.1 F (36.7 C) 98 F (36.7 C)  SpO2: 100% 100%    General: Awake, no distress.  CV:  Good peripheral perfusion.  Regular rate and rhythm Resp:  Normal effort.  Clear to auscultation bilaterally Abd:  No distention.  Soft nontender Other:  2+ pitting edema bilateral lower extremities.  Left lower extremity calf circumference larger than right, chronic per patient.   ED Results / Procedures / Treatments   Labs (all labs ordered are listed, but only abnormal results are displayed) Labs Reviewed  CBC - Abnormal; Notable for the following components:      Result Value   RDW 17.0 (*)    All other components within normal limits  COMPREHENSIVE METABOLIC PANEL - Abnormal; Notable for the following components:   Sodium 134 (*)    Glucose, Bld 124 (*)     Creatinine, Ser 1.45 (*)    Calcium 8.7 (*)    AST 58 (*)    ALT 98 (*)    GFR, Estimated 52 (*)    All other components within normal limits  BRAIN NATRIURETIC PEPTIDE - Abnormal; Notable for the following components:   B Natriuretic Peptide 3,032.9 (*)    All other components within normal limits  TROPONIN I (HIGH SENSITIVITY)     EKG Interpreted by me Sinus rhythm rate of 72 with frequent PVCs and bigeminy pattern.  Normal axis and intervals, no acute ischemic changes.   RADIOLOGY Chest x-ray interpreted by me, appears unremarkable.  Radiology report reviewed   PROCEDURES:  Procedures   MEDICATIONS ORDERED IN ED: Medications  torsemide (DEMADEX) tablet 40 mg (40 mg Oral Given 10/28/22 0431)     IMPRESSION / MDM / ASSESSMENT AND PLAN / ED COURSE  I reviewed the triage vital signs and the nursing notes.  DDx: Pneumonia, pleural effusion, pulmonary edema, non-STEMI, CHF exacerbation, anemia, electrolyte abnormality, AKI  Patient's presentation is most  consistent with acute presentation with potential threat to life or bodily function.  Patient presents with orthopnea and dyspnea on exertion.  Oxygenation is normal, other vital signs are reassuring.  He is nontoxic and breathing comfortably.  EKG chest x-ray labs are all unremarkable, BNP markedly elevated, troponin normal.  Ultrasound negative for DVT.  Will start on daily Lasix, follow-up with CHF clinic.       FINAL CLINICAL IMPRESSION(S) / ED DIAGNOSES   Final diagnoses:  Peripheral edema     Rx / DC Orders   ED Discharge Orders          Ordered    furosemide (LASIX) 20 MG tablet  Daily        10/28/22 0510             Note:  This document was prepared using Dragon voice recognition software and may include unintentional dictation errors.   Sharman Cheek, MD 10/28/22 [phone]

## 2022-11-06 ENCOUNTER — Encounter: Payer: Self-pay | Admitting: Family

## 2022-11-06 ENCOUNTER — Ambulatory Visit: Payer: No Typology Code available for payment source | Attending: Family | Admitting: Family

## 2022-11-06 ENCOUNTER — Encounter: Payer: Self-pay | Admitting: Pharmacy Technician

## 2022-11-06 ENCOUNTER — Telehealth (HOSPITAL_COMMUNITY): Payer: Self-pay

## 2022-11-06 ENCOUNTER — Other Ambulatory Visit (HOSPITAL_COMMUNITY): Payer: Self-pay

## 2022-11-06 VITALS — BP 137/78 | HR 73 | Wt 252.0 lb

## 2022-11-06 DIAGNOSIS — I509 Heart failure, unspecified: Secondary | ICD-10-CM | POA: Diagnosis present

## 2022-11-06 DIAGNOSIS — I110 Hypertensive heart disease with heart failure: Secondary | ICD-10-CM | POA: Insufficient documentation

## 2022-11-06 DIAGNOSIS — I5022 Chronic systolic (congestive) heart failure: Secondary | ICD-10-CM | POA: Diagnosis not present

## 2022-11-06 DIAGNOSIS — Z794 Long term (current) use of insulin: Secondary | ICD-10-CM

## 2022-11-06 DIAGNOSIS — I428 Other cardiomyopathies: Secondary | ICD-10-CM | POA: Diagnosis not present

## 2022-11-06 DIAGNOSIS — I10 Essential (primary) hypertension: Secondary | ICD-10-CM

## 2022-11-06 DIAGNOSIS — I5020 Unspecified systolic (congestive) heart failure: Secondary | ICD-10-CM

## 2022-11-06 DIAGNOSIS — E119 Type 2 diabetes mellitus without complications: Secondary | ICD-10-CM | POA: Insufficient documentation

## 2022-11-06 MED ORDER — SPIRONOLACTONE 25 MG PO TABS: 25.0000 mg | ORAL_TABLET | Freq: Every day | ORAL | 3 refills | Status: DC

## 2022-11-06 MED ORDER — DAPAGLIFLOZIN PROPANEDIOL 10 MG PO TABS: 10.0000 mg | ORAL_TABLET | Freq: Every day | ORAL | 5 refills | Status: DC

## 2022-11-06 MED ORDER — FUROSEMIDE 20 MG PO TABS: 20.0000 mg | ORAL_TABLET | Freq: Every day | ORAL | 5 refills | Status: DC

## 2022-11-06 NOTE — Telephone Encounter (Signed)
Advanced Heart Failure Patient Advocate Encounter  The patient was approved for a [employer] grant that will help cover the cost of Carvedilol, Entresto, Farxiga, Spironolactone.  Total amount awarded, $10,000.  Effective: 10/07/2022 - 10/06/2023.  BIN [id number] PCN [id number] Group [id number] ID [id number]  Pharmacy provided with approval and processing information. Patient informed via phone.  Burnell Blanks, CPhT Rx Patient Advocate Phone: [phone]

## 2022-11-06 NOTE — Addendum Note (Signed)
Addended by: Jola Schmidt A on: 11/06/2022 03:59 PM   Modules accepted: Orders

## 2022-11-06 NOTE — Consult Note (Signed)
[facility] - HEART FAILURE CLINIC - PHARMACIST COUNSELING NOTE  Guideline-Directed Medical Therapy/Evidence Based Medicine  ACE/ARB/ARNI: Sacubitril-valsartan 97-103 mg twice daily Beta Blocker: Carvedilol 3.125 mg twice daily Aldosterone Antagonist: Spironolactone 25 mg daily  (NOT TAKING) Diuretic: Furosemide 20 mg daily SGLT2i: Dapagliflozin 10 mg daily  Adherence Assessment  Do you ever forget to take your medication? [x] Yes [] No  Do you ever skip doses due to side effects? [x] Yes [] No  Do you have trouble affording your medicines? [x] Yes [] No  Are you ever unable to pick up your medication due to transportation difficulties? [] Yes [x] No  Do you ever stop taking your medications because you don't believe they are helping? [] Yes [x] No  Do you check your weight daily? [x] Yes [] No   Adherence strategy: Has a pill box at home but does not use because he remembers his medications  Barriers to obtaining medications: Cost is the big one. Regarding Marcelline Deist, he cannot afford the medication without a grant that he had previously received but has since expired. We will apply for a new grant with assistance from outpatient patient assistance. He receives his Sherryll Burger he receives through a Public relations account executive through [employer].  Vital signs: HR 73, BP 137/78, weight (pounds) 252 ECHO: Date 05/14/2022, EF 45-50%, notes moderate LV dilation and borderline LVH. Grade III DD.     Latest Ref Rng & Units 10/27/2022   10:11 PM 07/05/2022    2:45 PM 12/15/2018    1:26 PM  BMP  Glucose 70 - 99 mg/dL 161  096  99   BUN 8 - 23 mg/dL 22  14  13    Creatinine 0.61 - 1.24 mg/dL 0.45  4.09  8.11   Sodium 135 - 145 mmol/L 134  137  138   Potassium 3.5 - 5.1 mmol/L 4.5  3.8  4.3   Chloride 98 - 111 mmol/L 102  105  102   CO2 22 - 32 mmol/L 26  24  27    Calcium 8.9 - 10.3 mg/dL 8.7  8.8  9.3     Past Medical History:  Diagnosis Date   Arthritis    CHF (congestive heart  failure) (HCC)    Diabetes mellitus without complication (HCC)    Erectile dysfunction    History of kidney stones    Hypertension    Sleep apnea     ASSESSMENT 72 year old male who presents to the HF clinic for follow-up. He was recently hospitalized for edema that was particularly bothersome in his chest but also lower legs. He says his legs feel tight and can be heard being audibly short of breath when bending down to tie shoes. Notably, patient has no recollection of taking spironolactone and does not have the medication with him and no recent dispense history. Past potassium labs look WNL.  ReDS today very elevated at 49%. Patient not taking spironolactone and is nearing the end of his furosemide 20 mg daily x 15 days that was prescribed at discharge from his hospitalization.  Other information learned during med rec:  Patient no longer takes testosterone injections due to bouts of crying and weight gain.  Sometimes takes only 40 units of insulin glargine (instead of usual 80 units) on days where he feels lightheaded  Recent ED Visit (past 6 months):  Date - 10/28/2022, [facility] - Peripheral edema  PLAN CHF / HTN / Swelling Recommend restarting spironolactone 25 mg daily Per discussion with [facility] provider the plan regarding diuresis, with ReDS 49% will temporarily  intensify furosemide to 40 mg daily x 2 days followed by 20 mg daily thereafter Follow-up labs to monitor electrolytes and check Scr and repeat ReDS reading early next week Continue Entresto 97/103 mg twice daily, dapagliflozin 10 mg daily, and carvedilol 3.125 mg twice daily Also, will consult with outpatient patient assistance pharmacy team to restart application process for Farxiga grant DM 08/20/2022 A1c 6.6% Continue dapagliflozin 10 mg daily, dulaglutide weekly, glipizide 10 mg daily, and glargine 40-80 units daily HLD 08/20/2022 LDL 75 Continue atorvastatin 40 mg daily    Time spent: 15 minutes  Orson Aloe,  Pharm.D. Clinical Pharmacist 11/06/2022 2:16 PM    Current Outpatient Medications:    ASPIRIN 81 PO, Take by mouth daily., Disp: , Rfl:    atorvastatin (LIPITOR) 40 MG tablet, Take 40 mg by mouth daily., Disp: , Rfl:    carvedilol (COREG) 3.125 MG tablet, Take 1 tablet (3.125 mg total) by mouth 2 (two) times daily., Disp: 180 tablet, Rfl: 3   cholecalciferol (VITAMIN D) 25 MCG (1000 UT) tablet, Take 1,000 Units by mouth daily. (Patient not taking: Reported on 09/25/2021), Disp: , Rfl:    dapagliflozin propanediol (FARXIGA) 10 MG TABS tablet, Take 1 tablet (10 mg total) by mouth daily before breakfast., Disp: 30 tablet, Rfl: 5   Dulaglutide 1.5 MG/0.5ML SOPN, Inject 1.5 mg into the skin once a week. Mondays, Disp: , Rfl:    furosemide (LASIX) 20 MG tablet, Take 1 tablet (20 mg total) by mouth daily for 15 days., Disp: 15 tablet, Rfl: 0   glipiZIDE (GLUCOTROL) 10 MG tablet, Take 10 mg by mouth daily before breakfast., Disp: , Rfl:    insulin glargine (LANTUS) 100 UNIT/ML Solostar Pen, Inject 80 Units into the skin at bedtime., Disp: , Rfl:    NON FORMULARY, 386 mg 2 (two) times daily. Testosterone booster OTC product, Disp: , Rfl:    sacubitril-valsartan (ENTRESTO) 97-103 MG, Take 1 tablet by mouth 2 (two) times daily., Disp: 180 tablet, Rfl: 3   spironolactone (ALDACTONE) 25 MG tablet, Take 1 tablet (25 mg total) by mouth daily., Disp: 30 tablet, Rfl: 5   tadalafil (CIALIS) 5 MG tablet, Take by mouth daily as needed for erectile dysfunction., Disp: , Rfl:    vitamin B-12 (CYANOCOBALAMIN) 500 MCG tablet, Take 500 mcg by mouth daily. (Patient not taking: Reported on 11/06/2022), Disp: , Rfl:

## 2022-11-06 NOTE — Patient Instructions (Addendum)
START SPIRONOLACTONE 25 ONCE DAILY  LASIX- TAKE 2 TABLETS ONCE DAILY FOR 2 DAYS  DRINK 60-64 OUNCES OF LIQUID DAILY  Follow up in 1 wk  GET YOUR BLOOD WORK DONE AFTER YOUR NEXT VISIT NEXT WEEK

## 2022-11-06 NOTE — Progress Notes (Signed)
   11/06/22 1506  ReDS Vest / Clip  Station Marker C  Ruler Value 40  ReDS Value Range (!) > 40  ReDS Actual Value 49

## 2022-11-06 NOTE — Progress Notes (Signed)
PCP: Jerl Mina, MD (last seen 10/23) Primary Cardiologist: Harold Hedge, MD (last seen 09/22)  HPI:  Mr Belshaw is a 72 y/o male with a history of diabetes, HTN and chronic heart failure.   Was in the ED 10/28/22 due to shortness of breath for a week, worse with exertion along with orthopnea. BNP elevated. Ultrasound negative for DVT.  EKG chest x-ray labs are all unremarkable,   Echo 04/29/17: EF 35% along with mild MR/TR/AR.  Echo 03/31/20: EF 55-60% along with mild MR. Echo 05/14/22: EF 45-50% with mild LAE and mild MR.   Stress test 04/29/17: EF of 31% along with inferolateral T wave inversions with stress.  He presents today for a follow-up visit with a chief complaint of minimal shortness of breath. Chronic in nature. Has associated pedal edema (improving), fatigue and chronic difficulty sleeping along with this. Says that his shortness of breath has improved since his recent ED visit. Denies chest pain, cough, palpitations, abdominal distention, dizziness or weight gain.   Has compression socks but hasn't been wearing them. Trying to do better with his sodium intake but difficult due to financial constraints.   ROS: All systems negative except as listed in HPI, PMH and Problem List.  SH:  Social History   Socioeconomic History   Marital status: Single    Spouse name: Not on file   Number of children: 5   Years of education: 12   Highest education level: 12th grade  Occupational History   Occupation: retired  Tobacco Use   Smoking status: Never   Smokeless tobacco: Never  Vaping Use   Vaping status: Never Used  Substance and Sexual Activity   Alcohol use: No   Drug use: No   Sexual activity: Yes    Birth control/protection: Condom  Other Topics Concern   Not on file  Social History Narrative   Not on file   Social Determinants of Health   Financial Resource Strain: Medium Risk (12/05/2021)   Received from [facility], [facility]   Overall Financial Resource Strain (CARDIA)    Difficulty of Paying Living Expenses: Somewhat hard  Food Insecurity: Food Insecurity Present (12/05/2021)   Received from [facility], [facility]   Hunger Vital Sign    Worried About Running Out of Food in the Last Year: Sometimes true    Ran Out of Food in the Last Year: Sometimes true  Transportation Needs: No Transportation Needs (12/05/2021)   Received from [facility], [facility]   PRAPARE - Transportation    In the past 12 months, has lack of transportation kept you from medical appointments or from getting medications?: No    Lack of Transportation (Non-Medical): No  Physical Activity: Sufficiently Active (03/26/2017)   Exercise Vital Sign    Days of Exercise per Week: 7 days    Minutes of Exercise per Session: 60 min  Stress: No Stress Concern Present (03/26/2017)   [employer] of Occupational Health - Occupational Stress Questionnaire    Feeling of Stress : Only a little  Social Connections: Moderately Integrated (03/26/2017)   Social Connection and Isolation Panel [NHANES]    Frequency of Communication with Friends and Family: More than three times a week    Frequency of Social Gatherings with Friends and Family: Once a week    Attends Religious Services: 1 to 4 times per year    Active Member of [employer] or Organizations: Yes  Attends Banker Meetings: Never    Marital Status: Never married  Intimate Partner Violence: Unknown (03/26/2017)   Humiliation, Afraid, Rape, and Kick questionnaire    Fear of Current or Ex-Partner: Patient declined    Emotionally Abused: Patient declined    Physically Abused: Patient declined    Sexually Abused: Patient declined    FH:  Family History  Problem Relation Age of Onset   Diabetes Mother    Alcoholism Father    Diabetes Sister    Diabetes Sister    Arrhythmia Sister    Diabetes Sister      Past Medical History:  Diagnosis Date   Arthritis    CHF (congestive heart failure) (HCC)    Diabetes mellitus without complication (HCC)    Erectile dysfunction    History of kidney stones    Hypertension    Sleep apnea     Current Outpatient Medications  Medication Sig Dispense Refill   ASPIRIN 81 PO Take by mouth daily.     atorvastatin (LIPITOR) 40 MG tablet Take 40 mg by mouth daily.     carvedilol (COREG) 3.125 MG tablet Take 1 tablet (3.125 mg total) by mouth 2 (two) times daily. 180 tablet 3   cholecalciferol (VITAMIN D) 25 MCG (1000 UT) tablet Take 1,000 Units by mouth daily. (Patient not taking: Reported on 09/25/2021)     dapagliflozin propanediol (FARXIGA) 10 MG TABS tablet Take 1 tablet (10 mg total) by mouth daily before breakfast. 30 tablet 5   Dulaglutide 1.5 MG/0.5ML SOPN Inject 1.5 mg into the skin once a week. Mondays     furosemide (LASIX) 20 MG tablet Take 1 tablet (20 mg total) by mouth daily for 15 days. 15 tablet 0   glipiZIDE (GLUCOTROL) 10 MG tablet Take 10 mg by mouth daily before breakfast.     insulin glargine (LANTUS) 100 UNIT/ML Solostar Pen Inject 80 Units into the skin at bedtime.     NON FORMULARY 386 mg 2 (two) times daily. Testosterone booster OTC product     sacubitril-valsartan (ENTRESTO) 97-103 MG Take 1 tablet by mouth 2 (two) times daily. 180 tablet 3   spironolactone (ALDACTONE) 25 MG tablet Take 1 tablet (25 mg total) by mouth daily. 30 tablet 5   tadalafil (CIALIS) 5 MG tablet Take by mouth daily as needed for erectile dysfunction.     vitamin B-12 (CYANOCOBALAMIN) 500 MCG tablet Take 500 mcg by mouth daily.     No current facility-administered medications for this visit.   Vitals:   11/06/22 1410  BP: 137/78  Pulse: 73  SpO2: 100%  Weight: 252 lb (114.3 kg)   Wt Readings from Last 3 Encounters:  11/06/22 252 lb (114.3 kg)  10/27/22 260 lb (117.9 kg)  06/28/22 252 lb 4 oz (114.4 kg)   Lab Results  Component Value Date    CREATININE 1.45 (H) 10/27/2022   CREATININE 1.77 (H) 07/05/2022   CREATININE 1.14 12/15/2018   PHYSICAL EXAM:  General:  Well appearing. SOB when bending over at the waist HEENT: normal Neck: supple. JVP flat. No lymphadenopathy or thryomegaly appreciated. Cor: PMI normal. Regular rate & rhythm. No rubs, gallops or murmurs. Lungs: clear Abdomen: soft, nontender, nondistended. No hepatosplenomegaly. No bruits or masses.  Extremities: no cyanosis, clubbing, rash, 2+ pitting edema in bilateral lower legs to knees Neuro: alert & oriented x3, cranial nerves grossly intact. Moves all 4 extremities w/o difficulty. Affect pleasant.   ECG: not done  ReDs: 49%   ASSESSMENT &  PLAN:  1: NICM with mildly reduced ejection fraction- - etiology likely d/t HTN - NYHA class II - fluid overloaded today with symptoms and elevated ReDs reading - weighing daily; reminded to call for an overnight weight gain of >2 pounds or a weekly weight gain of >5 pounds - weight unchanged from last visit here 4 months ago - ReDs 49% - going to the gym and power lifting weights daily at [employer] as well as doing cardio at the gym - goes out dancing on Saturday nights - not adding salt to his food. Reviewed the importance of closely following a 2000mg  sodium diet  - does eat out occasionally  - keep daily fluid intake to 60-64 oz - wear compression socks daily with removal at bedtime - saw cardiology (Fath) 09/22 - continue entresto 97/103mg  BID - continue farxiga 10mg  daily - continue carvedilol 3.125mg  BID - continue furosemide 20mg  daily although for the next 2 days, he will take 40mg  daily - begin spironolactone 25mg  daily - BMP next week - BNP 10/27/22 was 3032.9 - PharmD reconciled meds w/ patient  2: HTN- - BP 137/78 - saw PCP Burnett Sheng) 10/23 - BMP 10/27/22 reviewed and showed sodium 134, potassium 4.5, creatinine 1.45 and GFR 52 - BMP next week  3: Diabetes-  - A1c 08/20/22 was 6.6% - saw  endocrinology (Solum) 07/24  Return in 1 week, sooner if needed.

## 2022-11-07 ENCOUNTER — Other Ambulatory Visit: Payer: Self-pay | Admitting: Pharmacy Technician

## 2022-11-07 ENCOUNTER — Telehealth: Payer: Self-pay | Admitting: Pharmacist

## 2022-11-07 NOTE — Progress Notes (Signed)
[facility] - PHARMACIST NOTE  I spoke with patient by phone on 11/07/2022 at about 1:50pm to inform him that he had been awarded the new grant for [place] effective from 10/07/2022 to 10/06/2023.  Per discussion with and documentation from medication assistance technician note, [facility] pharmacy was contacted and provided with the approval and processing information.  Patient understands that he is to reach back out to [place] if still experiencing difficulty filling [place].   Pharmacy claims processing information:  BIN [id number] PCN [id number] [id number] ID [id number]

## 2022-11-09 NOTE — Progress Notes (Unsigned)
PCP: Jerl Mina, MD (last seen 10/23) Primary Cardiologist: Harold Hedge, MD (last seen 09/22)  HPI:  Scott Hale is a 72 y/o male with a history of diabetes, HTN and chronic heart failure.   Was in the ED 10/28/22 due to shortness of breath for a week, worse with exertion along with orthopnea. BNP elevated. Ultrasound negative for DVT.  EKG chest x-ray labs are all unremarkable,   Echo 04/29/17: EF 35% along with mild Scott/TR/AR.  Echo 03/31/20: EF 55-60% along with mild Scott. Echo 05/14/22: EF 45-50% with mild LAE and mild Scott.   Stress test 04/29/17: EF of 31% along with inferolateral T wave inversions with stress.  He presents today for a follow-up visit with a chief complaint of minimal SOB with moderate exertion. Chronic in nature but improving since last visit here last week. Has associated minimal fatigue and pedal edema (improving) along with this. Denies chest pain, cough, palpitations, abdominal distention, dizziness or weight gain. Is now wearing compression socks and says that his leg swelling has improved.   Riding a stationary bike 20-60 minutes every day without any difficulty.   At last visit, spironolactone 25mg  daily was added along with increasing furosemide to 40mg  daily. He says that with the increase dose of furosemide, he ran out of the medication so hasn't had any furosemide in the last 3 days. Says that he definitely noticed an improvement when he increase the furosemide and started the spironolactone.   ROS: All systems negative except as listed in HPI, PMH and Problem List.  SH:  Social History   Socioeconomic History   Marital status: Single    Spouse name: Not on file   Number of children: 5   Years of education: 12   Highest education level: 12th grade  Occupational History   Occupation: retired  Tobacco Use   Smoking status: Never   Smokeless tobacco: Never  Vaping Use   Vaping status: Never Used  Substance and Sexual Activity   Alcohol use: No   Drug  use: No   Sexual activity: Yes    Birth control/protection: Condom  Other Topics Concern   Not on file  Social History Narrative   Not on file   Social Determinants of Health   Financial Resource Strain: Medium Risk (12/05/2021)   Received from [facility], [facility]   Overall Financial Resource Strain (CARDIA)    Difficulty of Paying Living Expenses: Somewhat hard  Food Insecurity: Food Insecurity Present (12/05/2021)   Received from [facility], [facility]   Hunger Vital Sign    Worried About Running Out of Food in the Last Year: Sometimes true    Ran Out of Food in the Last Year: Sometimes true  Transportation Needs: No Transportation Needs (12/05/2021)   Received from [facility], [facility]   PRAPARE - Transportation    In the past 12 months, has lack of transportation kept you from medical appointments or from getting medications?: No    Lack of Transportation (Non-Medical): No  Physical Activity: Sufficiently Active (03/26/2017)   Exercise Vital Sign    Days of Exercise per Week: 7 days    Minutes of Exercise per Session: 60 min  Stress: No Stress Concern Present (03/26/2017)   [employer] of Occupational Health - Occupational Stress Questionnaire    Feeling of Stress : Only a little  Social Connections: Moderately Integrated (03/26/2017)   Social Connection and Isolation  Panel [NHANES]    Frequency of Communication with Friends and Family: More than three times a week    Frequency of Social Gatherings with Friends and Family: Once a week    Attends Religious Services: 1 to 4 times per year    Active Member of [employer] or Organizations: Yes    Attends Banker Meetings: Never    Marital Status: Never married  Intimate Partner Violence: Unknown (03/26/2017)   Humiliation, Afraid, Rape, and Kick questionnaire    Fear of Current or Ex-Partner: Patient  declined    Emotionally Abused: Patient declined    Physically Abused: Patient declined    Sexually Abused: Patient declined    FH:  Family History  Problem Relation Age of Onset   Diabetes Mother    Alcoholism Father    Diabetes Sister    Diabetes Sister    Arrhythmia Sister    Diabetes Sister     Past Medical History:  Diagnosis Date   Arthritis    CHF (congestive heart failure) (HCC)    Diabetes mellitus without complication (HCC)    Erectile dysfunction    History of kidney stones    Hypertension    Sleep apnea     Current Outpatient Medications  Medication Sig Dispense Refill   acetaminophen (TYLENOL) 325 MG tablet Take 650 mg by mouth every 6 (six) hours as needed for mild pain, headache, fever or moderate pain.     ASPIRIN 81 PO Take by mouth daily.     atorvastatin (LIPITOR) 40 MG tablet Take 40 mg by mouth daily.     carvedilol (COREG) 3.125 MG tablet Take 1 tablet (3.125 mg total) by mouth 2 (two) times daily. 180 tablet 3   cholecalciferol (VITAMIN D) 25 MCG (1000 UT) tablet Take 1,000 Units by mouth daily. (Patient not taking: Reported on 09/25/2021)     dapagliflozin propanediol (FARXIGA) 10 MG TABS tablet Take 1 tablet (10 mg total) by mouth daily before breakfast. 30 tablet 5   Dulaglutide 1.5 MG/0.5ML SOPN Inject 1.5 mg into the skin once a week. Mondays     furosemide (LASIX) 20 MG tablet Take 1 tablet (20 mg total) by mouth daily for 15 days. 30 tablet 5   glipiZIDE (GLUCOTROL) 10 MG tablet Take 10 mg by mouth daily before breakfast.     insulin glargine (LANTUS) 100 UNIT/ML Solostar Pen Inject 40-80 Units into the skin at bedtime.     sacubitril-valsartan (ENTRESTO) 97-103 MG Take 1 tablet by mouth 2 (two) times daily. 180 tablet 3   spironolactone (ALDACTONE) 25 MG tablet Take 1 tablet (25 mg total) by mouth daily. (Patient not taking: Reported on 11/06/2022) 30 tablet 5   spironolactone (ALDACTONE) 25 MG tablet Take 1 tablet (25 mg total) by mouth daily.  90 tablet 3   tadalafil (CIALIS) 5 MG tablet Take by mouth daily as needed for erectile dysfunction.     vitamin B-12 (CYANOCOBALAMIN) 500 MCG tablet Take 500 mcg by mouth daily. (Patient not taking: Reported on 11/06/2022)     No current facility-administered medications for this visit.   Vitals:   11/12/22 1008  BP: (!) 140/83  Pulse: 65  SpO2: 100%  Weight: 244 lb (110.7 kg)   Wt Readings from Last 3 Encounters:  11/12/22 244 lb (110.7 kg)  11/06/22 252 lb (114.3 kg)  10/27/22 260 lb (117.9 kg)   Lab Results  Component Value Date   CREATININE 1.45 (H) 10/27/2022   CREATININE 1.77 (H) 07/05/2022  CREATININE 1.14 12/15/2018   PHYSICAL EXAM:  General:  Well appearing. SOB when bending over at the waist (although minimal) HEENT: normal Neck: supple. JVP flat. No lymphadenopathy or thryomegaly appreciated. Cor: PMI normal. Regular rate & rhythm. No rubs, gallops or murmurs. Lungs: clear Abdomen: soft, nontender, nondistended. No hepatosplenomegaly. No bruits or masses.  Extremities: no cyanosis, clubbing, rash, 1+ pitting edema in bilateral lower legs to knees Neuro: alert & oriented x3, cranial nerves grossly intact. Moves all 4 extremities w/o difficulty. Affect pleasant.   ECG: not done  ReDs: 47%   ASSESSMENT & PLAN:  1: NICM with mildly reduced ejection fraction- - etiology likely d/t HTN - NYHA class II - still volume up (edema and ReDs reading) but improving - weighing daily; reminded to call for an overnight weight gain of >2 pounds or a weekly weight gain of >5 pounds - weight down 8 pounds from last visit here 1 week ago - ReDs 47%; 6 days ago it was 49% - has been out of furosemide for the last 3 days; this was refilled today - going to the gym and power lifting weights daily at [facility] as well as doing cardio at the gym - riding stationary bike 20-60 minutes daily at the gym - goes out dancing on Saturday nights - not adding salt to his food.  Reviewed the importance of closely following a 2000mg  sodium diet  - does eat out occasionally  - keep daily fluid intake to 60-64 oz - continue wearing compression socks daily with elevation of legs when sitting for long periods of time - saw cardiology (Fath) 09/22 - continue entresto 97/103mg  BID - continue farxiga 10mg  daily - continue carvedilol 3.125mg  BID - continue furosemide 40mg  daily  - continue spironolactone 25mg  daily - BMP/ BNP today - BNP 10/27/22 was 3032.9  2: HTN- - BP 140/83 - saw PCP Burnett Sheng) 10/23 - BMP 10/27/22 reviewed and showed sodium 134, potassium 4.5, creatinine 1.45 and GFR 52 - BMP today  3: Diabetes-  - A1c 08/20/22 was 6.6% - saw endocrinology (Solum) 07/24  Return in 1 month, sooner if needed.

## 2022-11-12 ENCOUNTER — Ambulatory Visit (HOSPITAL_BASED_OUTPATIENT_CLINIC_OR_DEPARTMENT_OTHER): Payer: No Typology Code available for payment source | Admitting: Family

## 2022-11-12 ENCOUNTER — Encounter: Payer: Self-pay | Admitting: Family

## 2022-11-12 ENCOUNTER — Other Ambulatory Visit
Admission: RE | Admit: 2022-11-12 | Discharge: 2022-11-12 | Disposition: A | Discharge status: Home / Self Care | Payer: No Typology Code available for payment source | Source: Ambulatory Visit | Attending: Family | Admitting: Family

## 2022-11-12 VITALS — BP 140/83 | HR 65 | Wt 244.0 lb

## 2022-11-12 DIAGNOSIS — Z794 Long term (current) use of insulin: Secondary | ICD-10-CM

## 2022-11-12 DIAGNOSIS — I5020 Unspecified systolic (congestive) heart failure: Secondary | ICD-10-CM | POA: Diagnosis not present

## 2022-11-12 DIAGNOSIS — I10 Essential (primary) hypertension: Secondary | ICD-10-CM

## 2022-11-12 DIAGNOSIS — E119 Type 2 diabetes mellitus without complications: Secondary | ICD-10-CM

## 2022-11-12 DIAGNOSIS — I5022 Chronic systolic (congestive) heart failure: Secondary | ICD-10-CM | POA: Diagnosis not present

## 2022-11-12 LAB — BASIC METABOLIC PANEL
Anion gap: 6 (ref 5–15)
BUN: 18 mg/dL (ref 8–23)
CO2: 25 mmol/L (ref 22–32)
Calcium: 8.8 mg/dL — ABNORMAL LOW (ref 8.9–10.3)
Chloride: 104 mmol/L (ref 98–111)
Creatinine, Ser: 1.26 mg/dL — ABNORMAL HIGH (ref 0.61–1.24)
GFR, Estimated: >60 mL/min (ref >60)
Glucose, Bld: 171 mg/dL — ABNORMAL HIGH (ref 70–99)
Potassium: 4.6 mmol/L (ref 3.5–5.1)
Sodium: 135 mmol/L (ref 135–145)

## 2022-11-12 LAB — BRAIN NATRIURETIC PEPTIDE: B Natriuretic Peptide: 1639.6 pg/mL — ABNORMAL HIGH (ref 0.0–100.0)

## 2022-11-12 MED ORDER — FUROSEMIDE 40 MG PO TABS: 40.0000 mg | ORAL_TABLET | Freq: Every day | ORAL | 1 refills | Status: DC

## 2022-11-12 NOTE — Patient Instructions (Signed)
DISCONTINUE LASIX 20 MG  START LASIX 40 MG ONCE DAILY  [facility]. Go pass the gift shop and have your blood work completed.

## 2022-11-13 ENCOUNTER — Encounter: Payer: No Typology Code available for payment source | Admitting: Family

## 2022-11-27 NOTE — Progress Notes (Unsigned)
PCP: Jerl Mina, MD (last seen 10/23) Primary Cardiologist: Harold Hedge, MD (last seen 09/22)  HPI:  Mr Kropf is a 72 y/o male with a history of diabetes, HTN and chronic heart failure.   Was in the ED 10/28/22 due to shortness of breath for a week, worse with exertion along with orthopnea. BNP elevated. Ultrasound negative for DVT.  EKG chest x-ray labs are all unremarkable,   Echo 04/29/17: EF 35% along with mild MR/TR/AR.  Echo 03/31/20: EF 55-60% along with mild MR. Echo 05/14/22: EF 45-50% with mild LAE and mild MR.   Stress test 04/29/17: EF of 31% along with inferolateral T wave inversions with stress.  He presents today for a heart failure follow-up visit. Says that he "feels great" and denies fatigue, shortness of breath, chest pain, palpitations, cough, abdominal distention, pedal edema, dizziness, difficulty sleeping or weight gain.   Riding a stationary bike 60 minutes every day without any difficulty. Walking a mile at the park most evenings.   At last visit, furosemide was resumed. Has noticed weight loss and resolution of his shortness of breath. No longer feels short of breath when bending over at the waist. He says that some evenings he doesn't take any insulin because his bedtime glucose will be in the 60's-70's. When he does take the insulin, he will take the 80 units.   ROS: All systems negative except as listed in HPI, PMH and Problem List.  SH:  Social History   Socioeconomic History   Marital status: Single    Spouse name: Not on file   Number of children: 5   Years of education: 12   Highest education level: 12th grade  Occupational History   Occupation: retired  Tobacco Use   Smoking status: Never   Smokeless tobacco: Never  Vaping Use   Vaping status: Never Used  Substance and Sexual Activity   Alcohol use: No   Drug use: No   Sexual activity: Yes    Birth control/protection: Condom  Other Topics Concern   Not on file  Social History Narrative    Not on file   Social Determinants of Health   Financial Resource Strain: Medium Risk (12/05/2021)   Received from [facility], [facility]   Overall Financial Resource Strain (CARDIA)    Difficulty of Paying Living Expenses: Somewhat hard  Food Insecurity: Food Insecurity Present (12/05/2021)   Received from [facility], [facility]   Hunger Vital Sign    Worried About Running Out of Food in the Last Year: Sometimes true    Ran Out of Food in the Last Year: Sometimes true  Transportation Needs: No Transportation Needs (12/05/2021)   Received from [facility], [facility]   PRAPARE - Transportation    In the past 12 months, has lack of transportation kept you from medical appointments or from getting medications?: No    Lack of Transportation (Non-Medical): No  Physical Activity: Sufficiently Active (03/26/2017)   Exercise Vital Sign    Days of Exercise per Week: 7 days    Minutes of Exercise per Session: 60 min  Stress: No Stress Concern Present (03/26/2017)   [employer] of Occupational Health - Occupational Stress Questionnaire    Feeling of Stress : Only a little  Social Connections: Moderately Integrated (03/26/2017)   Social Connection and Isolation Panel [NHANES]    Frequency of Communication with Friends and Family: More than three times a  week    Frequency of Social Gatherings with Friends and Family: Once a week    Attends Religious Services: 1 to 4 times per year    Active Member of [employer] or Organizations: Yes    Attends Banker Meetings: Never    Marital Status: Never married  Intimate Partner Violence: Unknown (03/26/2017)   Humiliation, Afraid, Rape, and Kick questionnaire    Fear of Current or Ex-Partner: Patient declined    Emotionally Abused: Patient declined    Physically Abused: Patient declined    Sexually Abused: Patient declined     FH:  Family History  Problem Relation Age of Onset   Diabetes Mother    Alcoholism Father    Diabetes Sister    Diabetes Sister    Arrhythmia Sister    Diabetes Sister     Past Medical History:  Diagnosis Date   Arthritis    CHF (congestive heart failure) (HCC)    Diabetes mellitus without complication (HCC)    Erectile dysfunction    History of kidney stones    Hypertension    Sleep apnea     Current Outpatient Medications  Medication Sig Dispense Refill   acetaminophen (TYLENOL) 325 MG tablet Take 650 mg by mouth every 6 (six) hours as needed for mild pain, headache, fever or moderate pain.     ASPIRIN 81 PO Take by mouth daily.     atorvastatin (LIPITOR) 40 MG tablet Take 40 mg by mouth daily.     carvedilol (COREG) 3.125 MG tablet Take 1 tablet (3.125 mg total) by mouth 2 (two) times daily. 180 tablet 3   cholecalciferol (VITAMIN D) 25 MCG (1000 UT) tablet Take 1,000 Units by mouth daily.     dapagliflozin propanediol (FARXIGA) 10 MG TABS tablet Take 1 tablet (10 mg total) by mouth daily before breakfast. 30 tablet 5   Dulaglutide 1.5 MG/0.5ML SOPN Inject 1.5 mg into the skin once a week. Mondays     furosemide (LASIX) 40 MG tablet Take 1 tablet (40 mg total) by mouth daily. 90 tablet 1   glipiZIDE (GLUCOTROL) 10 MG tablet Take 10 mg by mouth daily before breakfast.     insulin glargine (LANTUS) 100 UNIT/ML Solostar Pen Inject 40-80 Units into the skin at bedtime.     sacubitril-valsartan (ENTRESTO) 97-103 MG Take 1 tablet by mouth 2 (two) times daily. 180 tablet 3   spironolactone (ALDACTONE) 25 MG tablet Take 1 tablet (25 mg total) by mouth daily. 90 tablet 3   tadalafil (CIALIS) 5 MG tablet Take by mouth daily as needed for erectile dysfunction.     vitamin B-12 (CYANOCOBALAMIN) 500 MCG tablet Take 500 mcg by mouth daily.     No current facility-administered medications for this visit.   Vitals:   11/28/22 1337  BP: (!) 140/72  Pulse: 61  SpO2: 100%  Weight:  230 lb (104.3 kg)   Wt Readings from Last 3 Encounters:  11/28/22 230 lb (104.3 kg)  11/12/22 244 lb (110.7 kg)  11/06/22 252 lb (114.3 kg)   Lab Results  Component Value Date   CREATININE 1.26 (H) 11/12/2022   CREATININE 1.45 (H) 10/27/2022   CREATININE 1.77 (H) 07/05/2022   PHYSICAL EXAM:  General:  Well appearing.  HEENT: normal Neck: supple. JVP flat. No lymphadenopathy or thryomegaly appreciated. Cor: PMI normal. Regular rate & rhythm. No rubs, gallops or murmurs. Lungs: clear Abdomen: soft, nontender, nondistended. No hepatosplenomegaly. No bruits or masses.  Extremities: no cyanosis, clubbing, rash,  trace pitting edema right lower leg Neuro: alert & oriented x3, cranial nerves grossly intact. Moves all 4 extremities w/o difficulty. Affect pleasant.   ECG: not done  ReDs: 34%   ASSESSMENT & PLAN:  1: NICM with mildly reduced ejection fraction- - etiology likely d/t HTN - NYHA class I - euvolemic - weighing daily; reminded to call for an overnight weight gain of >2 pounds or a weekly weight gain of >5 pounds - weight down 14 pounds from last visit here 2 weeks ago - ReDs 34%; 2 weeks ago it was 47% - going to the gym and power lifting weights daily at [employer] as well as doing cardio at the gym - riding stationary bike 60 minutes daily at the gym and now also walking at the park most evenings - goes out dancing on Saturday nights - not adding salt to his food. Reviewed the importance of closely following a 2000mg  sodium diet  - does eat out occasionally  - keeping daily fluid intake to 60-64 oz - continue wearing compression socks daily with elevation of legs when sitting for long periods of time - saw cardiology (Fath) 09/22 - continue entresto 97/103mg  BID - continue farxiga 10mg  daily - continue carvedilol 3.125mg  BID; HR may not allow for titration - continue furosemide 40mg  daily  - continue spironolactone 25mg  daily - BNP 11/12/22 was 1639.6  2:  HTN- - BP 140/72 - saw PCP Burnett Sheng) 10/23 - BMP 11/12/22 reviewed and showed sodium 135, potassium 4.6, creatinine 1.26 and GFR >60  3: Diabetes-  - A1c 08/20/22 was 6.6% - saw endocrinology (Solum) 07/24 - taking 80 units of insulin ~ every other night due to low glucose levels - sees endocrinology tomorrow and encouraged to discuss usage with her as he probably doesn't need nearly this much insulin anymore with his weight loss  Return in 6 months, sooner if needed.

## 2022-11-28 ENCOUNTER — Encounter: Payer: Self-pay | Admitting: Family

## 2022-11-28 ENCOUNTER — Ambulatory Visit: Payer: No Typology Code available for payment source | Attending: Family | Admitting: Family

## 2022-11-28 VITALS — BP 140/72 | HR 61 | Wt 230.0 lb

## 2022-11-28 DIAGNOSIS — Z794 Long term (current) use of insulin: Secondary | ICD-10-CM | POA: Insufficient documentation

## 2022-11-28 DIAGNOSIS — R0602 Shortness of breath: Secondary | ICD-10-CM | POA: Insufficient documentation

## 2022-11-28 DIAGNOSIS — I10 Essential (primary) hypertension: Secondary | ICD-10-CM

## 2022-11-28 DIAGNOSIS — I428 Other cardiomyopathies: Secondary | ICD-10-CM | POA: Insufficient documentation

## 2022-11-28 DIAGNOSIS — I509 Heart failure, unspecified: Secondary | ICD-10-CM | POA: Diagnosis not present

## 2022-11-28 DIAGNOSIS — I5022 Chronic systolic (congestive) heart failure: Secondary | ICD-10-CM

## 2022-11-28 DIAGNOSIS — I110 Hypertensive heart disease with heart failure: Secondary | ICD-10-CM | POA: Insufficient documentation

## 2022-11-28 DIAGNOSIS — Z79899 Other long term (current) drug therapy: Secondary | ICD-10-CM | POA: Insufficient documentation

## 2022-11-28 DIAGNOSIS — E119 Type 2 diabetes mellitus without complications: Secondary | ICD-10-CM | POA: Diagnosis not present

## 2022-11-28 NOTE — Patient Instructions (Signed)
Keep up the good work

## 2022-11-29 DIAGNOSIS — Z794 Long term (current) use of insulin: Secondary | ICD-10-CM | POA: Diagnosis not present

## 2022-11-29 DIAGNOSIS — E1142 Type 2 diabetes mellitus with diabetic polyneuropathy: Secondary | ICD-10-CM | POA: Diagnosis not present

## 2022-11-29 DIAGNOSIS — E782 Mixed hyperlipidemia: Secondary | ICD-10-CM | POA: Diagnosis not present

## 2022-11-29 DIAGNOSIS — E1159 Type 2 diabetes mellitus with other circulatory complications: Secondary | ICD-10-CM | POA: Diagnosis not present
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
## 2023-05-06 ENCOUNTER — Telehealth: Payer: Self-pay | Admitting: Family

## 2023-05-06 ENCOUNTER — Other Ambulatory Visit: Payer: Self-pay

## 2023-05-06 ENCOUNTER — Other Ambulatory Visit: Payer: Self-pay | Admitting: Family

## 2023-05-06 MED ORDER — ENTRESTO 97-103 MG PO TABS: 1.0000 | ORAL_TABLET | Freq: Two times a day (BID) | ORAL | 1 refills | Status: DC

## 2023-05-06 NOTE — Telephone Encounter (Signed)
 Medication sent to pharmacy

## 2023-05-12 ENCOUNTER — Other Ambulatory Visit: Payer: Self-pay | Admitting: Family

## 2023-05-20 ENCOUNTER — Telehealth: Payer: Self-pay | Admitting: Family

## 2023-05-20 ENCOUNTER — Other Ambulatory Visit: Payer: Self-pay | Admitting: Family

## 2023-05-24 ENCOUNTER — Telehealth: Payer: Self-pay | Admitting: Family

## 2023-05-27 ENCOUNTER — Other Ambulatory Visit: Payer: Self-pay

## 2023-05-27 ENCOUNTER — Telehealth: Payer: Self-pay

## 2023-05-27 MED ORDER — ENTRESTO 97-103 MG PO TABS: 1.0000 | ORAL_TABLET | Freq: Two times a day (BID) | ORAL | 1 refills | Status: DC

## 2023-05-27 NOTE — Telephone Encounter (Signed)
 Advanced Heart Failure Patient Advocate Encounter  Patient contacted office regarding cost of Entresto. Spoke with pharmacy by phone, grant was processed successfully and confirmed $0 copay.   Kennis Peacock, CPhT Rx Patient Advocate Phone: [phone]

## 2023-05-27 NOTE — Telephone Encounter (Signed)
 Refill sent to [facility] pharmacy.

## 2023-05-27 NOTE — Progress Notes (Signed)
 Medication Samples have been provided to the patient.  Drug name: Viola Greulich       Strength: 49/51 MG        Qty: 1  LOT: [id number]  Exp.Date: Jul 2025  Dosing instructions: Take 2 tablets by mouth two times daily  The patient has been instructed regarding the correct time, dose, and frequency of taking this medication, including desired effects and most common side effects.    Josuel Koeppen K Autumn Gunn 11:54 AM 05/27/2023

## 2023-05-28 ENCOUNTER — Telehealth: Payer: Self-pay | Admitting: Family

## 2023-05-28 NOTE — Progress Notes (Unsigned)
 Advanced Heart Failure Clinic Note     PCP: Jerl Mina, MD (last seen 10/23) Primary Cardiologist: Harold Hedge, MD (last seen 09/22)  HPI:  Scott Hale is a 73 y/o male with a history of diabetes, HTN and chronic heart failure.   Was in the ED 10/28/22 due to shortness of breath for a week, worse with exertion along with orthopnea. BNP elevated. Ultrasound negative for DVT.  EKG chest x-ray labs are all unremarkable,   Echo 04/29/17: EF 35% along with mild Scott/TR/AR.  Echo 03/31/20: EF 55-60% along with mild Scott. Echo 05/14/22: EF 45-50% with mild LAE and mild Scott.   Stress test 04/29/17: EF of 31% along with inferolateral T wave inversions with stress.  He presents today for a heart failure follow-up visit. Says that he "feels great" and denies fatigue, shortness of breath, chest pain, palpitations, cough, abdominal distention, pedal edema, dizziness, difficulty sleeping or weight gain.   Riding a stationary bike 60 minutes every day without any difficulty. Walking a mile at the park most evenings.   At last visit, furosemide was resumed. Has noticed weight loss and resolution of his shortness of breath. No longer feels short of breath when bending over at the waist. He says that some evenings he doesn't take any insulin because his bedtime glucose will be in the 60's-70's. When he does take the insulin, he will take the 80 units.   ROS: All systems negative except as listed in HPI, PMH and Problem List.  SH:  Social History   Socioeconomic History   Marital status: Single    Spouse name: Not on file   Number of children: 5   Years of education: 12   Highest education level: 12th grade  Occupational History   Occupation: retired  Tobacco Use   Smoking status: Never   Smokeless tobacco: Never  Vaping Use   Vaping status: Never Used  Substance and Sexual Activity   Alcohol use: No   Drug use: No   Sexual activity: Yes    Birth control/protection: Condom  Other Topics  Concern   Not on file  Social History Narrative   Not on file   Social Drivers of Health   Financial Resource Strain: Medium Risk (12/05/2021)   Received from [facility], [facility]   Overall Financial Resource Strain (CARDIA)    Difficulty of Paying Living Expenses: Somewhat hard  Food Insecurity: Food Insecurity Present (12/05/2021)   Received from [facility], [facility]   Hunger Vital Sign    Worried About Running Out of Food in the Last Year: Sometimes true    Ran Out of Food in the Last Year: Sometimes true  Transportation Needs: No Transportation Needs (12/05/2021)   Received from [facility], [facility]   PRAPARE - Transportation    In the past 12 months, has lack of transportation kept you from medical appointments or from getting medications?: No    Lack of Transportation (Non-Medical): No  Physical Activity: Sufficiently Active (03/26/2017)   Exercise Vital Sign    Days of Exercise per Week: 7 days    Minutes of Exercise per Session: 60 min  Stress: No Stress Concern Present (03/26/2017)   [employer] of Occupational Health - Occupational Stress Questionnaire    Feeling of Stress : Only a little  Social Connections: Moderately Integrated (03/26/2017)   Social Connection and Isolation Panel [NHANES]    Frequency of Communication  with Friends and Family: More than three times a week    Frequency of Social Gatherings with Friends and Family: Once a week    Attends Religious Services: 1 to 4 times per year    Active Member of [employer] or Organizations: Yes    Attends Banker Meetings: Never    Marital Status: Never married  Intimate Partner Violence: Unknown (03/26/2017)   Humiliation, Afraid, Rape, and Kick questionnaire    Fear of Current or Ex-Partner: Patient declined    Emotionally Abused: Patient declined    Physically Abused: Patient  declined    Sexually Abused: Patient declined    FH:  Family History  Problem Relation Age of Onset   Diabetes Mother    Alcoholism Father    Diabetes Sister    Diabetes Sister    Arrhythmia Sister    Diabetes Sister     Past Medical History:  Diagnosis Date   Arthritis    CHF (congestive heart failure) (HCC)    Diabetes mellitus without complication (HCC)    Erectile dysfunction    History of kidney stones    Hypertension    Sleep apnea     Current Outpatient Medications  Medication Sig Dispense Refill   acetaminophen (TYLENOL) 325 MG tablet Take 650 mg by mouth every 6 (six) hours as needed for mild pain, headache, fever or moderate pain.     ASPIRIN 81 PO Take by mouth daily.     atorvastatin (LIPITOR) 40 MG tablet TAKE 1 TABLET BY MOUTH EVERY DAY *NEW PRESCRIPTION REQUEST* 90 tablet 1   carvedilol (COREG) 3.125 MG tablet TAKE ONE (1) TABLET BY MOUTH TWICE DAILY *NEW PRESCRIPTION REQUEST* 180 tablet 1   cholecalciferol (VITAMIN D) 25 MCG (1000 UT) tablet Take 1,000 Units by mouth daily.     dapagliflozin propanediol (FARXIGA) 10 MG TABS tablet TAKE 1 TABLET BY MOUTH EVERY DAY BEFORE BREAKFAST *NEW PRESCRIPTION REQUEST* 90 tablet 1   Dulaglutide 1.5 MG/0.5ML SOPN Inject 1.5 mg into the skin once a week. Mondays     furosemide (LASIX) 40 MG tablet TAKE 1 TABLET BY MOUTH EVERY DAY *NEW PRESCRIPTION REQUEST* 90 tablet 1   glipiZIDE (GLUCOTROL) 10 MG tablet Take 10 mg by mouth daily before breakfast.     insulin glargine (LANTUS) 100 UNIT/ML Solostar Pen Inject 40-80 Units into the skin at bedtime.     sacubitril-valsartan (ENTRESTO) 97-103 MG Take 1 tablet by mouth 2 (two) times daily. 180 tablet 1   spironolactone (ALDACTONE) 25 MG tablet TAKE 1 TABLET BY MOUTH EVERY DAY *NEW PRESCRIPTION REQUEST* 90 tablet 1   tadalafil (CIALIS) 5 MG tablet Take by mouth daily as needed for erectile dysfunction.     vitamin B-12 (CYANOCOBALAMIN) 500 MCG tablet Take 500 mcg by mouth daily.      No current facility-administered medications for this visit.   There were no vitals filed for this visit.  Wt Readings from Last 3 Encounters:  11/28/22 230 lb (104.3 kg)  11/12/22 244 lb (110.7 kg)  11/06/22 252 lb (114.3 kg)   Lab Results  Component Value Date   CREATININE 1.26 (H) 11/12/2022   CREATININE 1.45 (H) 10/27/2022   CREATININE 1.77 (H) 07/05/2022   PHYSICAL EXAM:  General:  Well appearing.  HEENT: normal Neck: supple. JVP flat. No lymphadenopathy or thryomegaly appreciated. Cor: PMI normal. Regular rate & rhythm. No rubs, gallops or murmurs. Lungs: clear Abdomen: soft, nontender, nondistended. No hepatosplenomegaly. No bruits or masses.  Extremities: no cyanosis,  clubbing, rash, trace pitting edema right lower leg Neuro: alert & oriented x3, cranial nerves grossly intact. Moves all 4 extremities w/o difficulty. Affect pleasant.   ECG: not done  ReDs: 34%   ASSESSMENT & PLAN:  1: NICM with mildly reduced ejection fraction- - etiology likely d/t HTN - NYHA class I - euvolemic - weighing daily; reminded to call for an overnight weight gain of >2 pounds or a weekly weight gain of >5 pounds - weight down 14 pounds from last visit here 2 weeks ago - ReDs 34%; 2 weeks ago it was 47% - going to the gym and power lifting weights daily at [employer] as well as doing cardio at the gym - riding stationary bike 60 minutes daily at the gym and now also walking at the park most evenings - goes out dancing on Saturday nights - not adding salt to his food. Reviewed the importance of closely following a 2000mg  sodium diet  - does eat out occasionally  - keeping daily fluid intake to 60-64 oz - continue wearing compression socks daily with elevation of legs when sitting for long periods of time - saw cardiology (Fath) 09/22 - continue entresto 97/103mg  BID - continue farxiga 10mg  daily - continue carvedilol 3.125mg  BID; HR may not allow for titration - continue  furosemide 40mg  daily  - continue spironolactone 25mg  daily - BNP 11/12/22 was 1639.6  2: HTN- - BP 140/72 - saw PCP Dean Every) 10/23 - BMP 11/12/22 reviewed and showed sodium 135, potassium 4.6, creatinine 1.26 and GFR >60  3: Diabetes-  - A1c 08/20/22 was 6.6% - saw endocrinology (Solum) 07/24 - taking 80 units of insulin ~ every other night due to low glucose levels - sees endocrinology tomorrow and encouraged to discuss usage with her as he probably doesn't need nearly this much insulin anymore with his weight loss  Return in 6 months, sooner if needed.      Charlette Console, FNP 05/28/23

## 2023-05-28 NOTE — Telephone Encounter (Incomplete)
 Called to confirm/remind patient of their appointment at the [facility] Clinic on 05/29/23***.   Appointment:   [x] Confirmed  [] Left mess   [] No answer/No voice mail  [] Phone not in service  Patient reminded to bring all medications and/or complete list.  Confirmed patient has transportation. Gave directions, instructed to utilize valet parking.

## 2023-05-29 ENCOUNTER — Ambulatory Visit: Payer: No Typology Code available for payment source | Attending: Family | Admitting: Family

## 2023-05-29 ENCOUNTER — Encounter: Payer: Self-pay | Admitting: Family

## 2023-05-29 VITALS — BP 140/73 | HR 53 | Wt 243.0 lb

## 2023-05-29 DIAGNOSIS — I110 Hypertensive heart disease with heart failure: Secondary | ICD-10-CM | POA: Diagnosis not present

## 2023-05-29 DIAGNOSIS — Z79899 Other long term (current) drug therapy: Secondary | ICD-10-CM | POA: Diagnosis not present

## 2023-05-29 DIAGNOSIS — Z7985 Long-term (current) use of injectable non-insulin antidiabetic drugs: Secondary | ICD-10-CM | POA: Diagnosis not present

## 2023-05-29 DIAGNOSIS — R202 Paresthesia of skin: Secondary | ICD-10-CM | POA: Diagnosis not present

## 2023-05-29 DIAGNOSIS — R200 Anesthesia of skin: Secondary | ICD-10-CM | POA: Diagnosis not present

## 2023-05-29 DIAGNOSIS — I10 Essential (primary) hypertension: Secondary | ICD-10-CM

## 2023-05-29 DIAGNOSIS — Z794 Long term (current) use of insulin: Secondary | ICD-10-CM

## 2023-05-29 DIAGNOSIS — E119 Type 2 diabetes mellitus without complications: Secondary | ICD-10-CM | POA: Diagnosis not present

## 2023-05-29 DIAGNOSIS — I5022 Chronic systolic (congestive) heart failure: Secondary | ICD-10-CM

## 2023-05-29 DIAGNOSIS — Z7984 Long term (current) use of oral hypoglycemic drugs: Secondary | ICD-10-CM | POA: Insufficient documentation

## 2023-05-29 DIAGNOSIS — I428 Other cardiomyopathies: Secondary | ICD-10-CM | POA: Diagnosis not present

## 2023-05-29 NOTE — Patient Instructions (Addendum)
 Referrals:  Please call [facility] and get your appointment scheduled with them.  [address] [place], [place] [address] Phone: [phone] Hours:  8:00-5:00 M-F   Follow-Up in: 6 months with Shawnee Dellen, FNP.  At the [facility] Clinic, you and your health needs are our priority. We have a designated team specialized in the treatment of Heart Failure. This Care Team includes your primary Heart Failure Specialized Cardiologist (physician), Advanced Practice Providers (APPs- Physician Assistants and Nurse Practitioners), and Pharmacist who all work together to provide you with the care you need, when you need it.   You may see any of the following providers on your designated Care Team at your next follow up:  Dr. Jules Oar Dr. Peder Bourdon Dr. Alwin Baars Dr. Judyth Nunnery Shawnee Dellen, FNP Bevely Brush, RPH-CPP  Please be sure to bring in all your medications bottles to every appointment.   Need to Contact Us :  If you have any questions or concerns before your next appointment please send us  a message through [place] or call our office at [phone].    TO LEAVE A MESSAGE FOR THE NURSE SELECT OPTION 2, PLEASE LEAVE A MESSAGE INCLUDING: YOUR NAME DATE OF BIRTH CALL BACK NUMBER REASON FOR CALL**this is important as we prioritize the call backs  YOU WILL RECEIVE A CALL BACK THE SAME DAY AS LONG AS YOU CALL BEFORE 4:00 PM

## 2023-06-07 ENCOUNTER — Inpatient Hospital Stay
Admission: EM | Admit: 2023-06-07 | Discharge: 2023-06-19 | DRG: 854 | Disposition: A | Discharge status: Home Health | Attending: Internal Medicine | Admitting: Internal Medicine

## 2023-06-07 ENCOUNTER — Emergency Department

## 2023-06-07 ENCOUNTER — Other Ambulatory Visit: Payer: Self-pay

## 2023-06-07 DIAGNOSIS — N529 Male erectile dysfunction, unspecified: Secondary | ICD-10-CM | POA: Diagnosis present

## 2023-06-07 DIAGNOSIS — M009 Pyogenic arthritis, unspecified: Secondary | ICD-10-CM | POA: Diagnosis present

## 2023-06-07 DIAGNOSIS — I96 Gangrene, not elsewhere classified: Secondary | ICD-10-CM

## 2023-06-07 DIAGNOSIS — L03115 Cellulitis of right lower limb: Principal | ICD-10-CM

## 2023-06-07 DIAGNOSIS — N179 Acute kidney failure, unspecified: Secondary | ICD-10-CM | POA: Diagnosis present

## 2023-06-07 DIAGNOSIS — E1169 Type 2 diabetes mellitus with other specified complication: Secondary | ICD-10-CM | POA: Diagnosis present

## 2023-06-07 DIAGNOSIS — E1142 Type 2 diabetes mellitus with diabetic polyneuropathy: Secondary | ICD-10-CM | POA: Diagnosis present

## 2023-06-07 DIAGNOSIS — Z87442 Personal history of urinary calculi: Secondary | ICD-10-CM

## 2023-06-07 DIAGNOSIS — N1831 Chronic kidney disease, stage 3a: Secondary | ICD-10-CM | POA: Diagnosis present

## 2023-06-07 DIAGNOSIS — M79605 Pain in left leg: Secondary | ICD-10-CM | POA: Diagnosis present

## 2023-06-07 DIAGNOSIS — A419 Sepsis, unspecified organism: Principal | ICD-10-CM

## 2023-06-07 DIAGNOSIS — Z96651 Presence of right artificial knee joint: Secondary | ICD-10-CM | POA: Diagnosis present

## 2023-06-07 DIAGNOSIS — I2510 Atherosclerotic heart disease of native coronary artery without angina pectoris: Secondary | ICD-10-CM | POA: Diagnosis present

## 2023-06-07 DIAGNOSIS — I130 Hypertensive heart and chronic kidney disease with heart failure and stage 1 through stage 4 chronic kidney disease, or unspecified chronic kidney disease: Secondary | ICD-10-CM | POA: Diagnosis present

## 2023-06-07 DIAGNOSIS — Z5941 Food insecurity: Secondary | ICD-10-CM

## 2023-06-07 DIAGNOSIS — I739 Peripheral vascular disease, unspecified: Secondary | ICD-10-CM

## 2023-06-07 DIAGNOSIS — I70261 Atherosclerosis of native arteries of extremities with gangrene, right leg: Secondary | ICD-10-CM | POA: Diagnosis present

## 2023-06-07 DIAGNOSIS — L7622 Postprocedural hemorrhage and hematoma of skin and subcutaneous tissue following other procedure: Secondary | ICD-10-CM | POA: Diagnosis not present

## 2023-06-07 DIAGNOSIS — E11621 Type 2 diabetes mellitus with foot ulcer: Secondary | ICD-10-CM | POA: Diagnosis present

## 2023-06-07 DIAGNOSIS — Z7984 Long term (current) use of oral hypoglycemic drugs: Secondary | ICD-10-CM

## 2023-06-07 DIAGNOSIS — L97519 Non-pressure chronic ulcer of other part of right foot with unspecified severity: Secondary | ICD-10-CM | POA: Diagnosis present

## 2023-06-07 DIAGNOSIS — Z7902 Long term (current) use of antithrombotics/antiplatelets: Secondary | ICD-10-CM

## 2023-06-07 DIAGNOSIS — E785 Hyperlipidemia, unspecified: Secondary | ICD-10-CM | POA: Diagnosis present

## 2023-06-07 DIAGNOSIS — L0390 Cellulitis, unspecified: Secondary | ICD-10-CM | POA: Diagnosis not present

## 2023-06-07 DIAGNOSIS — E1152 Type 2 diabetes mellitus with diabetic peripheral angiopathy with gangrene: Secondary | ICD-10-CM | POA: Diagnosis present

## 2023-06-07 DIAGNOSIS — Z7985 Long-term (current) use of injectable non-insulin antidiabetic drugs: Secondary | ICD-10-CM

## 2023-06-07 DIAGNOSIS — Y838 Other surgical procedures as the cause of abnormal reaction of the patient, or of later complication, without mention of misadventure at the time of the procedure: Secondary | ICD-10-CM | POA: Diagnosis not present

## 2023-06-07 DIAGNOSIS — Z5986 Financial insecurity: Secondary | ICD-10-CM

## 2023-06-07 DIAGNOSIS — Z1152 Encounter for screening for COVID-19: Secondary | ICD-10-CM

## 2023-06-07 DIAGNOSIS — R6520 Severe sepsis without septic shock: Secondary | ICD-10-CM | POA: Diagnosis present

## 2023-06-07 DIAGNOSIS — E871 Hypo-osmolality and hyponatremia: Secondary | ICD-10-CM | POA: Diagnosis present

## 2023-06-07 DIAGNOSIS — Z833 Family history of diabetes mellitus: Secondary | ICD-10-CM

## 2023-06-07 DIAGNOSIS — Z7982 Long term (current) use of aspirin: Secondary | ICD-10-CM

## 2023-06-07 DIAGNOSIS — E1122 Type 2 diabetes mellitus with diabetic chronic kidney disease: Secondary | ICD-10-CM | POA: Diagnosis present

## 2023-06-07 DIAGNOSIS — E11628 Type 2 diabetes mellitus with other skin complications: Secondary | ICD-10-CM

## 2023-06-07 DIAGNOSIS — D631 Anemia in chronic kidney disease: Secondary | ICD-10-CM | POA: Diagnosis present

## 2023-06-07 DIAGNOSIS — Z811 Family history of alcohol abuse and dependence: Secondary | ICD-10-CM

## 2023-06-07 DIAGNOSIS — M869 Osteomyelitis, unspecified: Secondary | ICD-10-CM | POA: Diagnosis present

## 2023-06-07 DIAGNOSIS — L089 Local infection of the skin and subcutaneous tissue, unspecified: Secondary | ICD-10-CM

## 2023-06-07 DIAGNOSIS — B9561 Methicillin susceptible Staphylococcus aureus infection as the cause of diseases classified elsewhere: Secondary | ICD-10-CM | POA: Diagnosis present

## 2023-06-07 DIAGNOSIS — Z79899 Other long term (current) drug therapy: Secondary | ICD-10-CM

## 2023-06-07 DIAGNOSIS — Z951 Presence of aortocoronary bypass graft: Secondary | ICD-10-CM

## 2023-06-07 DIAGNOSIS — Z794 Long term (current) use of insulin: Secondary | ICD-10-CM

## 2023-06-07 DIAGNOSIS — G4730 Sleep apnea, unspecified: Secondary | ICD-10-CM | POA: Diagnosis present

## 2023-06-07 LAB — CBC WITH DIFFERENTIAL/PLATELET
Abs Immature Granulocytes: 0.08 10*3/uL — ABNORMAL HIGH (ref 0.00–0.07)
Basophils Absolute: 0 10*3/uL (ref 0.0–0.1)
Basophils Relative: 0 %
Eosinophils Absolute: 0.1 10*3/uL (ref 0.0–0.5)
Eosinophils Relative: 0 %
HCT: 38.9 % — ABNORMAL LOW (ref 39.0–52.0)
Hemoglobin: 13.2 g/dL (ref 13.0–17.0)
Immature Granulocytes: 1 %
Lymphocytes Relative: 9 %
Lymphs Abs: 1.4 10*3/uL (ref 0.7–4.0)
MCH: 30.7 pg (ref 26.0–34.0)
MCHC: 33.9 g/dL (ref 30.0–36.0)
MCV: 90.5 fL (ref 80.0–100.0)
Monocytes Absolute: 1.5 10*3/uL — ABNORMAL HIGH (ref 0.1–1.0)
Monocytes Relative: 9 %
Neutro Abs: 12.6 10*3/uL — ABNORMAL HIGH (ref 1.7–7.7)
Neutrophils Relative %: 81 %
Platelets: 255 10*3/uL (ref 150–400)
RBC: 4.3 MIL/uL (ref 4.22–5.81)
RDW: 13.2 % (ref 11.5–15.5)
WBC: 15.6 10*3/uL — ABNORMAL HIGH (ref 4.0–10.5)
nRBC: 0 % (ref 0.0–0.2)

## 2023-06-07 LAB — BASIC METABOLIC PANEL WITH GFR
Anion gap: 7 (ref 5–15)
BUN: 13 mg/dL (ref 8–23)
CO2: 25 mmol/L (ref 22–32)
Calcium: 8.8 mg/dL — ABNORMAL LOW (ref 8.9–10.3)
Chloride: 101 mmol/L (ref 98–111)
Creatinine, Ser: 1.85 mg/dL — ABNORMAL HIGH (ref 0.61–1.24)
GFR, Estimated: 39 mL/min — ABNORMAL LOW (ref >60)
Glucose, Bld: 153 mg/dL — ABNORMAL HIGH (ref 70–99)
Potassium: 3.6 mmol/L (ref 3.5–5.1)
Sodium: 133 mmol/L — ABNORMAL LOW (ref 135–145)

## 2023-06-07 LAB — HIV ANTIBODY (ROUTINE TESTING W REFLEX): HIV Screen 4th Generation wRfx: NONREACTIVE

## 2023-06-07 LAB — SEDIMENTATION RATE: Sed Rate: 105 mm/h — ABNORMAL HIGH (ref 0–20)

## 2023-06-07 LAB — RESP PANEL BY RT-PCR (RSV, FLU A&B, COVID)  RVPGX2
Influenza A by PCR: NEGATIVE
Influenza B by PCR: NEGATIVE
Resp Syncytial Virus by PCR: NEGATIVE
SARS Coronavirus 2 by RT PCR: NEGATIVE

## 2023-06-07 LAB — GLUCOSE, CAPILLARY: Glucose-Capillary: 166 mg/dL — ABNORMAL HIGH (ref 70–99)

## 2023-06-07 LAB — LACTIC ACID, PLASMA: Lactic Acid, Venous: 1.5 mmol/L (ref 0.5–1.9)

## 2023-06-07 MED ORDER — CEFAZOLIN SODIUM-DEXTROSE 2-4 GM/100ML-% IV SOLN
2.0000 g | Freq: Three times a day (TID) | INTRAVENOUS | Status: DC
  Administered 2023-06-07 – 2023-06-10 (×9): 2 g via INTRAVENOUS
  Filled 2023-06-07 (×10): qty 100

## 2023-06-07 MED ORDER — ENOXAPARIN SODIUM 60 MG/0.6ML IJ SOSY
50.0000 mg | PREFILLED_SYRINGE | INTRAMUSCULAR | Status: DC
  Administered 2023-06-07 – 2023-06-11 (×5): 50 mg via SUBCUTANEOUS
  Filled 2023-06-07 (×5): qty 0.6

## 2023-06-07 MED ORDER — VANCOMYCIN HCL IN DEXTROSE 1-5 GM/200ML-% IV SOLN
1000.0000 mg | Freq: Once | INTRAVENOUS | Status: AC
  Administered 2023-06-07: 1000 mg via INTRAVENOUS
  Filled 2023-06-07: qty 200

## 2023-06-07 MED ORDER — SODIUM CHLORIDE 0.9 % IV SOLN
2.0000 g | Freq: Once | INTRAVENOUS | Status: AC
  Administered 2023-06-07: 2 g via INTRAVENOUS
  Filled 2023-06-07: qty 12.5

## 2023-06-07 MED ORDER — SODIUM CHLORIDE 0.9 % IV BOLUS (SEPSIS)
1000.0000 mL | Freq: Once | INTRAVENOUS | Status: AC
Start: 2023-06-07 — End: 2023-06-07
  Administered 2023-06-07: 1000 mL via INTRAVENOUS

## 2023-06-07 MED ORDER — SODIUM CHLORIDE 0.9 % IV SOLN: 2.0000 g | Freq: Once | INTRAVENOUS | Status: DC

## 2023-06-07 MED ORDER — INSULIN ASPART 100 UNIT/ML IJ SOLN
0.0000 [IU] | Freq: Three times a day (TID) | INTRAMUSCULAR | Status: DC
  Administered 2023-06-08: 3 [IU] via SUBCUTANEOUS
  Administered 2023-06-08 – 2023-06-10 (×4): 2 [IU] via SUBCUTANEOUS
  Filled 2023-06-07 (×5): qty 1

## 2023-06-07 MED ORDER — ACETAMINOPHEN 325 MG PO TABS
650.0000 mg | ORAL_TABLET | Freq: Once | ORAL | Status: AC
  Administered 2023-06-07: 650 mg via ORAL
  Filled 2023-06-07: qty 2

## 2023-06-07 MED ORDER — LACTATED RINGERS IV SOLN
INTRAVENOUS | Status: DC
Start: 2023-06-07 — End: 2023-06-08

## 2023-06-07 MED ORDER — TRAMADOL HCL 50 MG PO TABS
50.0000 mg | ORAL_TABLET | Freq: Four times a day (QID) | ORAL | Status: DC | PRN
Start: 2023-06-07 — End: 2023-06-19
  Administered 2023-06-07 – 2023-06-08 (×3): 100 mg via ORAL
  Administered 2023-06-09: 50 mg via ORAL
  Administered 2023-06-09: 100 mg via ORAL
  Administered 2023-06-10 – 2023-06-12 (×2): 50 mg via ORAL
  Administered 2023-06-14 – 2023-06-16 (×3): 100 mg via ORAL
  Administered 2023-06-16 – 2023-06-18 (×4): 50 mg via ORAL
  Filled 2023-06-07: qty 1
  Filled 2023-06-07 (×2): qty 2
  Filled 2023-06-07: qty 1
  Filled 2023-06-07: qty 2
  Filled 2023-06-07: qty 1
  Filled 2023-06-07 (×2): qty 2
  Filled 2023-06-07: qty 1
  Filled 2023-06-07 (×2): qty 2
  Filled 2023-06-07 (×2): qty 1
  Filled 2023-06-07 (×2): qty 2

## 2023-06-07 MED ORDER — METRONIDAZOLE 500 MG/100ML IV SOLN
500.0000 mg | Freq: Once | INTRAVENOUS | Status: DC
Start: 2023-06-07 — End: 2023-06-07
  Administered 2023-06-07: 500 mg via INTRAVENOUS
  Filled 2023-06-07: qty 100

## 2023-06-07 MED ORDER — INSULIN GLARGINE-YFGN 100 UNIT/ML ~~LOC~~ SOLN
60.0000 [IU] | Freq: Every day | SUBCUTANEOUS | Status: DC
  Administered 2023-06-07 – 2023-06-09 (×3): 60 [IU] via SUBCUTANEOUS
  Filled 2023-06-07 (×4): qty 0.6

## 2023-06-07 MED ORDER — INSULIN ASPART 100 UNIT/ML IJ SOLN: 0.0000 [IU] | Freq: Every day | INTRAMUSCULAR | Status: DC

## 2023-06-07 MED ORDER — ASPIRIN 81 MG PO CHEW
81.0000 mg | CHEWABLE_TABLET | Freq: Every day | ORAL | Status: DC
  Administered 2023-06-08 – 2023-06-12 (×5): 81 mg via ORAL
  Filled 2023-06-07 (×5): qty 1

## 2023-06-07 NOTE — Consult Note (Signed)
 CODE SEPSIS - PHARMACY COMMUNICATION  **Broad Spectrum Antibiotics should be administered within 1 hour of Sepsis diagnosis**  Time Code Sepsis Called/Page Received: 1714  Antibiotics Ordered: cefepime , vancomycin  and Flagyl   Time of 1st antibiotic administration: 1736  Additional action taken by pharmacy: N/A  Ramonita Burow ,PharmD Clinical Pharmacist  06/07/2023  5:15 PM

## 2023-06-07 NOTE — ED Provider Notes (Signed)
  EMERGENCY DEPARTMENT AT [facility] Provider Note   CSN: [id number] Arrival date & time: 06/07/23  1435     History  Chief Complaint  Patient presents with   Foot Pain    Scott Hale is a 73 y.o. male.  Patient here for right foot pain for 1 week.  He thinks maybe he came down on it wrong while doing dips.  Unsure.  No prior surgical history to this joint.  No cough congestion or fevers.   Foot Pain Pertinent negatives include no chest pain, no abdominal pain and no shortness of breath.       Home Medications Prior to Admission medications   Medication Sig Start Date End Date Taking? Authorizing Provider  acetaminophen  (TYLENOL ) 325 MG tablet Take 650 mg by mouth every 6 (six) hours as needed for mild pain, headache, fever or moderate pain.    [provider]  ASPIRIN 81 PO Take by mouth daily.    [provider]  atorvastatin (LIPITOR) 40 MG tablet TAKE 1 TABLET BY MOUTH EVERY DAY *NEW PRESCRIPTION REQUEST* 05/21/23   Charlette Console, FNP  carvedilol  (COREG ) 3.125 MG tablet TAKE ONE (1) TABLET BY MOUTH TWICE DAILY *NEW PRESCRIPTION REQUEST* 05/21/23   Charlette Console, FNP  cholecalciferol (VITAMIN D) 25 MCG (1000 UT) tablet Take 1,000 Units by mouth daily.    [provider]  dapagliflozin  propanediol (FARXIGA ) 10 MG TABS tablet TAKE 1 TABLET BY MOUTH EVERY DAY BEFORE BREAKFAST *NEW PRESCRIPTION REQUEST* 05/21/23   Shawnee Dellen A, FNP  Dulaglutide 1.5 MG/0.5ML SOPN Inject 1.5 mg into the skin once a week. Mondays 09/17/19   [provider]  furosemide  (LASIX ) 40 MG tablet TAKE 1 TABLET BY MOUTH EVERY DAY *NEW PRESCRIPTION REQUEST* 05/21/23   Shawnee Dellen A, FNP  glipiZIDE (GLUCOTROL) 10 MG tablet Take 10 mg by mouth daily before breakfast.    [provider]  insulin  glargine (LANTUS ) 100 UNIT/ML Solostar Pen Inject 40-80 Units into the skin at bedtime.    [provider]  sacubitril -valsartan  (ENTRESTO ) 97-103  MG Take 1 tablet by mouth 2 (two) times daily. 05/27/23   Charlette Console, FNP  spironolactone  (ALDACTONE ) 25 MG tablet TAKE 1 TABLET BY MOUTH EVERY DAY *NEW PRESCRIPTION REQUEST* 05/21/23   Shawnee Dellen A, FNP  tadalafil (CIALIS) 5 MG tablet Take by mouth daily as needed for erectile dysfunction.    [provider]  vitamin B-12 (CYANOCOBALAMIN) 500 MCG tablet Take 500 mcg by mouth daily.    [provider]      Allergies    Patient has no known allergies.    Review of Systems   Review of Systems  Constitutional:  Negative for chills and fever.  Respiratory:  Negative for cough and shortness of breath.   Cardiovascular:  Negative for chest pain.  Gastrointestinal:  Negative for abdominal pain.  Musculoskeletal:  Positive for arthralgias.  Skin:  Negative for rash and wound.  Neurological:  Negative for weakness and numbness.    Physical Exam Updated Vital Signs BP 127/76 (BP Location: Left Arm)   Pulse 89   Temp (!) 100.5 F (38.1 C) (Oral)   Resp 18   Ht 6' (1.829 m)   Wt 108 kg   SpO2 99%   BMI 32.28 kg/m  Physical Exam Vitals reviewed.  Constitutional:      Appearance: Normal appearance.  HENT:     Head: Normocephalic and atraumatic.     Nose: Nose normal.  Cardiovascular:     Pulses: Normal pulses.  Pulmonary:     Effort: Pulmonary effort is normal.  Musculoskeletal:     Cervical back: Normal range of motion.     Comments: Right midfoot tenderness with diffuse swelling and redness.  Dorsal pedal pulse intact.  F ROM ankle.  No bony tenderness ankle.  Neurological:     Mental Status: He is alert and oriented to person, place, and time. Mental status is at baseline.  Psychiatric:        Mood and Affect: Mood normal.        Behavior: Behavior normal.     ED Results / Procedures / Treatments   Labs (all labs ordered are listed, but only abnormal results are displayed) Labs Reviewed  BASIC METABOLIC PANEL WITH GFR - Abnormal; Notable for the  following components:      Result Value   Sodium 133 (*)    Glucose, Bld 153 (*)    Creatinine, Ser 1.85 (*)    Calcium 8.8 (*)    GFR, Estimated 39 (*)    All other components within normal limits  CBC WITH DIFFERENTIAL/PLATELET - Abnormal; Notable for the following components:   WBC 15.6 (*)    HCT 38.9 (*)    Neutro Abs 12.6 (*)    Monocytes Absolute 1.5 (*)    Abs Immature Granulocytes 0.08 (*)    All other components within normal limits  SEDIMENTATION RATE - Abnormal; Notable for the following components:   Sed Rate 105 (*)    All other components within normal limits  RESP PANEL BY RT-PCR (RSV, FLU A&B, COVID)  RVPGX2  CULTURE, BLOOD (ROUTINE X 2)  CULTURE, BLOOD (ROUTINE X 2)  LACTIC ACID, PLASMA  LACTIC ACID, PLASMA    EKG None  Radiology DG Foot Complete Right Result Date: 06/07/2023 CLINICAL DATA:  Swelling and redness on top of foot. EXAM: RIGHT FOOT COMPLETE - 3+ VIEW COMPARISON:  Mild-to-moderate hallux valgus. FINDINGS: Mild great toe interphalangeal joint space narrowing. Mild dorsal navicular degenerative osteophytosis extending to the talonavicular and navicular-cuneiform articulations. No acute fracture or dislocation. No cortical erosion. No subcutaneous air. IMPRESSION: 1. Mild great toe interphalangeal joint osteoarthritis. 2. Mild midfoot osteoarthritis. 3. No cortical erosion or subcutaneous air. Electronically Signed   By: Bertina Broccoli M.D.   On: 06/07/2023 16:10    Procedures Procedures    Medications Ordered in ED Medications  lactated ringers infusion (0 mLs Intravenous Hold 06/07/23 1736)  sodium chloride  0.9 % bolus 1,000 mL (1,000 mLs Intravenous New Bag/Given 06/07/23 1731)  ceFEPIme  (MAXIPIME ) 2 g in sodium chloride  0.9 % 100 mL IVPB (2 g Intravenous New Bag/Given 06/07/23 1736)  metroNIDAZOLE  (FLAGYL ) IVPB 500 mg (has no administration in time range)  vancomycin  (VANCOCIN ) IVPB 1000 mg/200 mL premix (has no administration in time range)   acetaminophen  (TYLENOL ) tablet 650 mg (650 mg Oral Given 06/07/23 1655)    ED Course/ Medical Decision Making/ A&P                                 Medical Decision Making Patient here for right foot pain with unclear injury.  He does have a fever of 100.5 with no other identifiable reason this does raise suspicion infection such as cellulitis.  He does have a white count of 15 immediately ordered blood cultures and antibiotics.  No hypotension.  I discussed with attending Dr. Martina Sledge who evaluates  and does recommend admission for septic cellulitis.  Sepsis bundle utilized with broad-spectrum antibiotics.  His systemic symptoms fever and leukocytosis pose a significant threat to life and limb  My independent interpretation of the right foot x-ray does not reveal any fractures only mild swelling.  I did have a conversation with hospitalist [facility] who agrees to evaluate for admission.  Amount and/or Complexity of Data Reviewed Labs: ordered. Radiology: ordered.  Risk OTC drugs. Prescription drug management. Decision regarding hospitalization.           Final Clinical Impression(s) / ED Diagnoses Final diagnoses:  Cellulitis of right foot  Sepsis, due to unspecified organism, unspecified whether acute organ dysfunction present [facility])    Rx / DC Orders ED Discharge Orders     None         Hollie Luria, [place] 06/07/23 1737    Bryson Carbine, MD 06/07/23 1753

## 2023-06-07 NOTE — H&P (Signed)
 History and Physical    Scott Hale BJY:[id number] DOB: 10-03-1950 DOA: 06/07/2023  PCP: Lyle San, MD  Patient coming from: home  I have personally briefly reviewed patient's old medical records in [facility]  Chief Complaint: right foot pain  HPI: Scott Hale is a 73 y.o. male with medical history significant of DM2, HTN, CKD who presented with right foot pain.   Pt reported pain in his right foot started about 2 weeks ago.  Yesterday, right foot started swelling, became red and developed streaking up towards the knee.  No known injury or bug bite to the right LE.  Pt has a prosthetic right knee.     ED Course: initial vitals:  temp 100.5, pulse 89, BP 127/76, RR 18, sating 99% on room air.  Labs notable for wBC 15.6, Cr 1.85.  xray of right foot showed no acute finding.  Pt received vanc/cefe/flagyl  in the ED before admission.   Assessment/Plan  RLE cellulitis --received vanc/cefe/flagyl  in the ED. --no skin injury, non-purulent --switch abx to Ancef   Sepsis --fever, leukocytosis, source cellulitis  DM2 --recent A1c 7.6.  Pt reported taking Levemir 80u nightly at home. --order glargine 60u nightly --ACHS and SSI  HTN --hold home BP meds since blood pressure currently wnl  CKD 3a --Cr 1.85 on presentation, unclear if AKI or progression of CKD. --MIVF for now   DVT prophylaxis: Lovenox  SQ Code Status: Full code  Family Communication: friend/care giver updated at bedside on admission  Disposition Plan: home  Consults called: none Level of care: Med-Surg   Review of Systems: As per HPI otherwise complete review of systems negative.   Past Medical History:  Diagnosis Date   Arthritis    CHF (congestive heart failure) (HCC)    Diabetes mellitus without complication (HCC)    Erectile dysfunction    History of kidney stones    Hypertension    Sleep apnea     Past Surgical History:  Procedure Laterality Date   CARDIAC CATHETERIZATION      COLONOSCOPY N/A 03/07/2021   Procedure: COLONOSCOPY;  Surgeon: [name], MD;  Location: [facility] ENDOSCOPY;  Service: Endoscopy;  Laterality: N/A;  IDDM   COLONOSCOPY WITH PROPOFOL  N/A 04/09/2017   Procedure: COLONOSCOPY WITH PROPOFOL ;  Surgeon: [name], MD;  Location: [facility] ENDOSCOPY;  Service: Gastroenterology;  Laterality: N/A;   CORONARY ARTERY BYPASS GRAFT  2003   JOINT REPLACEMENT     total knee   RENAL ARTERY STENT     due to kidney stones     reports that he has never smoked. He has never used smokeless tobacco. He reports that he does not drink alcohol and does not use drugs.  No Known Allergies  Family History  Problem Relation Age of Onset   Diabetes Mother    Alcoholism Father    Diabetes Sister    Diabetes Sister    Arrhythmia Sister    Diabetes Sister     Prior to Admission medications   Medication Sig Start Date End Date Taking? Authorizing Provider  acetaminophen  (TYLENOL ) 325 MG tablet Take 650 mg by mouth every 6 (six) hours as needed for mild pain, headache, fever or moderate pain.    [provider]  ASPIRIN 81 PO Take by mouth daily.    [provider]  atorvastatin (LIPITOR) 40 MG tablet TAKE 1 TABLET BY MOUTH EVERY DAY *NEW PRESCRIPTION REQUEST* 05/21/23   Shawnee Dellen A, FNP  carvedilol  (COREG ) 3.125 MG tablet TAKE ONE (1)  TABLET BY MOUTH TWICE DAILY *NEW PRESCRIPTION REQUEST* 05/21/23   Charlette Console, FNP  cholecalciferol (VITAMIN D) 25 MCG (1000 UT) tablet Take 1,000 Units by mouth daily.    [provider]  dapagliflozin  propanediol (FARXIGA ) 10 MG TABS tablet TAKE 1 TABLET BY MOUTH EVERY DAY BEFORE BREAKFAST *NEW PRESCRIPTION REQUEST* 05/21/23   Shawnee Dellen A, FNP  Dulaglutide 1.5 MG/0.5ML SOPN Inject 1.5 mg into the skin once a week. Mondays 09/17/19   [provider]  furosemide  (LASIX ) 40 MG tablet TAKE 1 TABLET BY MOUTH EVERY DAY *NEW PRESCRIPTION REQUEST* 05/21/23   Shawnee Dellen A, FNP  glipiZIDE (GLUCOTROL)  10 MG tablet Take 10 mg by mouth daily before breakfast.    [provider]  insulin  glargine (LANTUS ) 100 UNIT/ML Solostar Pen Inject 40-80 Units into the skin at bedtime.    [provider]  sacubitril -valsartan  (ENTRESTO ) 97-103 MG Take 1 tablet by mouth 2 (two) times daily. 05/27/23   Charlette Console, FNP  spironolactone  (ALDACTONE ) 25 MG tablet TAKE 1 TABLET BY MOUTH EVERY DAY *NEW PRESCRIPTION REQUEST* 05/21/23   Shawnee Dellen A, FNP  tadalafil (CIALIS) 5 MG tablet Take by mouth daily as needed for erectile dysfunction.    [provider]  vitamin B-12 (CYANOCOBALAMIN) 500 MCG tablet Take 500 mcg by mouth daily.    [provider]    Physical Exam: Vitals:   06/07/23 1449 06/07/23 1453 06/07/23 1745  BP: 127/76  113/79  Pulse: 89  82  Resp: 18  14  Temp: (!) 100.5 F (38.1 C)  99.3 F (37.4 C)  TempSrc: Oral  Oral  SpO2: 99%  100%  Weight:  108 kg   Height:  6' (1.829 m)     Constitutional: NAD, AAOx3 HEENT: conjunctivae and lids normal, EOMI CV: No cyanosis.   RESP: normal respiratory effort, on RA Extremities: see photo SKIN: warm, dry Neuro: II - XII grossly intact.   Psych: Normal mood and affect.  Appropriate judgement and reason      Labs on Admission: I have personally reviewed labs and imaging studies  Time spent: 55 minutes  Garrison Kanner MD [facility]  If 7PM-7AM, please contact night-coverage 06/07/2023, 6:06 PM

## 2023-06-07 NOTE — Sepsis Progress Note (Signed)
 eLink is following this Code Sepsis.

## 2023-06-07 NOTE — ED Notes (Signed)
 MD Gordy Lauber in with patient.

## 2023-06-07 NOTE — ED Triage Notes (Signed)
 Pt comes with right foot pain. Pt states he is unsure if he hyperextended his foot. Pt states pain and swelling. Pt states this happened on Saturday.

## 2023-06-08 DIAGNOSIS — L539 Erythematous condition, unspecified: Secondary | ICD-10-CM | POA: Diagnosis not present

## 2023-06-08 DIAGNOSIS — M7989 Other specified soft tissue disorders: Secondary | ICD-10-CM | POA: Diagnosis not present

## 2023-06-08 DIAGNOSIS — E11621 Type 2 diabetes mellitus with foot ulcer: Secondary | ICD-10-CM | POA: Diagnosis present

## 2023-06-08 DIAGNOSIS — N1831 Chronic kidney disease, stage 3a: Secondary | ICD-10-CM | POA: Diagnosis present

## 2023-06-08 DIAGNOSIS — Z7984 Long term (current) use of oral hypoglycemic drugs: Secondary | ICD-10-CM | POA: Diagnosis not present

## 2023-06-08 DIAGNOSIS — E1142 Type 2 diabetes mellitus with diabetic polyneuropathy: Secondary | ICD-10-CM | POA: Diagnosis present

## 2023-06-08 DIAGNOSIS — Y838 Other surgical procedures as the cause of abnormal reaction of the patient, or of later complication, without mention of misadventure at the time of the procedure: Secondary | ICD-10-CM | POA: Diagnosis not present

## 2023-06-08 DIAGNOSIS — Z96651 Presence of right artificial knee joint: Secondary | ICD-10-CM | POA: Diagnosis not present

## 2023-06-08 DIAGNOSIS — I70235 Atherosclerosis of native arteries of right leg with ulceration of other part of foot: Secondary | ICD-10-CM | POA: Diagnosis not present

## 2023-06-08 DIAGNOSIS — M009 Pyogenic arthritis, unspecified: Secondary | ICD-10-CM | POA: Diagnosis present

## 2023-06-08 DIAGNOSIS — L0390 Cellulitis, unspecified: Secondary | ICD-10-CM | POA: Diagnosis present

## 2023-06-08 DIAGNOSIS — E11628 Type 2 diabetes mellitus with other skin complications: Secondary | ICD-10-CM | POA: Diagnosis present

## 2023-06-08 DIAGNOSIS — A419 Sepsis, unspecified organism: Secondary | ICD-10-CM | POA: Diagnosis present

## 2023-06-08 DIAGNOSIS — R6520 Severe sepsis without septic shock: Secondary | ICD-10-CM | POA: Diagnosis present

## 2023-06-08 DIAGNOSIS — D631 Anemia in chronic kidney disease: Secondary | ICD-10-CM | POA: Diagnosis present

## 2023-06-08 DIAGNOSIS — E1152 Type 2 diabetes mellitus with diabetic peripheral angiopathy with gangrene: Secondary | ICD-10-CM | POA: Diagnosis present

## 2023-06-08 DIAGNOSIS — E871 Hypo-osmolality and hyponatremia: Secondary | ICD-10-CM | POA: Diagnosis present

## 2023-06-08 DIAGNOSIS — L089 Local infection of the skin and subcutaneous tissue, unspecified: Secondary | ICD-10-CM | POA: Diagnosis not present

## 2023-06-08 DIAGNOSIS — E1169 Type 2 diabetes mellitus with other specified complication: Secondary | ICD-10-CM | POA: Diagnosis present

## 2023-06-08 DIAGNOSIS — I130 Hypertensive heart and chronic kidney disease with heart failure and stage 1 through stage 4 chronic kidney disease, or unspecified chronic kidney disease: Secondary | ICD-10-CM | POA: Diagnosis present

## 2023-06-08 DIAGNOSIS — I96 Gangrene, not elsewhere classified: Secondary | ICD-10-CM | POA: Diagnosis not present

## 2023-06-08 DIAGNOSIS — M869 Osteomyelitis, unspecified: Secondary | ICD-10-CM | POA: Diagnosis present

## 2023-06-08 DIAGNOSIS — I739 Peripheral vascular disease, unspecified: Secondary | ICD-10-CM | POA: Diagnosis not present

## 2023-06-08 DIAGNOSIS — E1122 Type 2 diabetes mellitus with diabetic chronic kidney disease: Secondary | ICD-10-CM | POA: Diagnosis present

## 2023-06-08 DIAGNOSIS — N179 Acute kidney failure, unspecified: Secondary | ICD-10-CM | POA: Diagnosis present

## 2023-06-08 DIAGNOSIS — Z794 Long term (current) use of insulin: Secondary | ICD-10-CM | POA: Diagnosis not present

## 2023-06-08 DIAGNOSIS — I70261 Atherosclerosis of native arteries of extremities with gangrene, right leg: Secondary | ICD-10-CM | POA: Diagnosis present

## 2023-06-08 DIAGNOSIS — L97519 Non-pressure chronic ulcer of other part of right foot with unspecified severity: Secondary | ICD-10-CM | POA: Diagnosis present

## 2023-06-08 DIAGNOSIS — I708 Atherosclerosis of other arteries: Secondary | ICD-10-CM | POA: Diagnosis not present

## 2023-06-08 DIAGNOSIS — E1140 Type 2 diabetes mellitus with diabetic neuropathy, unspecified: Secondary | ICD-10-CM | POA: Diagnosis not present

## 2023-06-08 DIAGNOSIS — L7622 Postprocedural hemorrhage and hematoma of skin and subcutaneous tissue following other procedure: Secondary | ICD-10-CM | POA: Diagnosis not present

## 2023-06-08 DIAGNOSIS — Z1152 Encounter for screening for COVID-19: Secondary | ICD-10-CM | POA: Diagnosis not present

## 2023-06-08 DIAGNOSIS — I2510 Atherosclerotic heart disease of native coronary artery without angina pectoris: Secondary | ICD-10-CM | POA: Diagnosis present

## 2023-06-08 DIAGNOSIS — L03115 Cellulitis of right lower limb: Secondary | ICD-10-CM | POA: Diagnosis present

## 2023-06-08 DIAGNOSIS — E785 Hyperlipidemia, unspecified: Secondary | ICD-10-CM | POA: Diagnosis present

## 2023-06-08 LAB — GLUCOSE, CAPILLARY
Glucose-Capillary: 125 mg/dL — ABNORMAL HIGH (ref 70–99)
Glucose-Capillary: 170 mg/dL — ABNORMAL HIGH (ref 70–99)
Glucose-Capillary: 121 mg/dL — ABNORMAL HIGH (ref 70–99)
Glucose-Capillary: 164 mg/dL — ABNORMAL HIGH (ref 70–99)

## 2023-06-08 LAB — BASIC METABOLIC PANEL WITH GFR
Anion gap: 4 — ABNORMAL LOW (ref 5–15)
BUN: 13 mg/dL (ref 8–23)
CO2: 24 mmol/L (ref 22–32)
Calcium: 8.1 mg/dL — ABNORMAL LOW (ref 8.9–10.3)
Chloride: 103 mmol/L (ref 98–111)
Creatinine, Ser: 1.62 mg/dL — ABNORMAL HIGH (ref 0.61–1.24)
GFR, Estimated: 45 mL/min — ABNORMAL LOW (ref >60)
Glucose, Bld: 132 mg/dL — ABNORMAL HIGH (ref 70–99)
Potassium: 3.5 mmol/L (ref 3.5–5.1)
Sodium: 131 mmol/L — ABNORMAL LOW (ref 135–145)

## 2023-06-08 LAB — CBC
HCT: 34.1 % — ABNORMAL LOW (ref 39.0–52.0)
Hemoglobin: 11.7 g/dL — ABNORMAL LOW (ref 13.0–17.0)
MCH: 30.3 pg (ref 26.0–34.0)
MCHC: 34.3 g/dL (ref 30.0–36.0)
MCV: 88.3 fL (ref 80.0–100.0)
Platelets: 226 10*3/uL (ref 150–400)
RBC: 3.86 MIL/uL — ABNORMAL LOW (ref 4.22–5.81)
RDW: 13.3 % (ref 11.5–15.5)
WBC: 15 10*3/uL — ABNORMAL HIGH (ref 4.0–10.5)
nRBC: 0 % (ref 0.0–0.2)

## 2023-06-08 LAB — MAGNESIUM: Magnesium: 2.2 mg/dL (ref 1.7–2.4)

## 2023-06-08 MED ORDER — LACTATED RINGERS IV SOLN: INTRAVENOUS | Status: DC

## 2023-06-08 NOTE — Plan of Care (Signed)
 Problem: Education: Goal: Ability to describe self-care measures that may prevent or decrease complications (Diabetes Survival Skills Education) will improve 06/08/2023 0414 by Ysidro Her, RN Outcome: Progressing 06/08/2023 0414 by Ysidro Her, RN Outcome: Progressing Goal: Individualized Educational Video(s) 06/08/2023 0414 by Ysidro Her, RN Outcome: Not Applicable 06/08/2023 0414 by Ysidro Her, RN Outcome: Progressing   Problem: Coping: Goal: Ability to adjust to condition or change in health will improve 06/08/2023 0414 by Ysidro Her, RN Outcome: Progressing 06/08/2023 0414 by Ysidro Her, RN Outcome: Progressing   Problem: Fluid Volume: Goal: Ability to maintain a balanced intake and output will improve 06/08/2023 0414 by Ysidro Her, RN Outcome: Progressing 06/08/2023 0414 by Ysidro Her, RN Outcome: Progressing   Problem: Health Behavior/Discharge Planning: Goal: Ability to identify and utilize available resources and services will improve 06/08/2023 0414 by Ysidro Her, RN Outcome: Progressing 06/08/2023 0414 by Ysidro Her, RN Outcome: Progressing Goal: Ability to manage health-related needs will improve 06/08/2023 0414 by Ysidro Her, RN Outcome: Progressing 06/08/2023 0414 by Ysidro Her, RN Outcome: Progressing   Problem: Metabolic: Goal: Ability to maintain appropriate glucose levels will improve 06/08/2023 0414 by Ysidro Her, RN Outcome: Progressing 06/08/2023 0414 by Ysidro Her, RN Outcome: Progressing   Problem: Nutritional: Goal: Maintenance of adequate nutrition will improve 06/08/2023 0414 by Ysidro Her, RN Outcome: Progressing 06/08/2023 0414 by Ysidro Her, RN Outcome: Progressing Goal: Progress toward achieving an optimal weight will improve 06/08/2023 0414 by Ysidro Her, RN Outcome: Progressing 06/08/2023 0414 by Ysidro Her,  RN Outcome: Progressing   Problem: Skin Integrity: Goal: Risk for impaired skin integrity will decrease 06/08/2023 0414 by Ysidro Her, RN Outcome: Progressing 06/08/2023 0414 by Ysidro Her, RN Outcome: Progressing   Problem: Tissue Perfusion: Goal: Adequacy of tissue perfusion will improve 06/08/2023 0414 by Ysidro Her, RN Outcome: Progressing 06/08/2023 0414 by Ysidro Her, RN Outcome: Progressing   Problem: Clinical Measurements: Goal: Ability to avoid or minimize complications of infection will improve 06/08/2023 0414 by Ysidro Her, RN Outcome: Progressing 06/08/2023 0414 by Ysidro Her, RN Outcome: Progressing   Problem: Skin Integrity: Goal: Skin integrity will improve 06/08/2023 0414 by Ysidro Her, RN Outcome: Progressing 06/08/2023 0414 by Ysidro Her, RN Outcome: Progressing   Problem: Education: Goal: Knowledge of General Education information will improve Description: Including pain rating scale, medication(s)/side effects and non-pharmacologic comfort measures 06/08/2023 0414 by Ysidro Her, RN Outcome: Progressing 06/08/2023 0414 by Ysidro Her, RN Outcome: Progressing   Problem: Health Behavior/Discharge Planning: Goal: Ability to manage health-related needs will improve 06/08/2023 0414 by Ysidro Her, RN Outcome: Progressing 06/08/2023 0414 by Ysidro Her, RN Outcome: Progressing   Problem: Clinical Measurements: Goal: Ability to maintain clinical measurements within normal limits will improve 06/08/2023 0414 by Ysidro Her, RN Outcome: Progressing 06/08/2023 0414 by Ysidro Her, RN Outcome: Progressing Goal: Will remain free from infection 06/08/2023 0414 by Ysidro Her, RN Outcome: Progressing 06/08/2023 0414 by Ysidro Her, RN Outcome: Progressing Goal: Diagnostic test results will improve 06/08/2023 0414 by Ysidro Her, RN Outcome:  Progressing 06/08/2023 0414 by Ysidro Her, RN Outcome: Progressing Goal: Respiratory complications will improve 06/08/2023 0414 by Ysidro Her, RN Outcome: Progressing 06/08/2023 0414 by Ysidro Her, RN Outcome: Progressing Goal: Cardiovascular complication will be avoided 06/08/2023 0414 by Ysidro Her, RN Outcome: Progressing 06/08/2023 0414 by Scott Hale  Stacia Dynes, RN Outcome: Progressing   Problem: Activity: Goal: Risk for activity intolerance will decrease 06/08/2023 0414 by Ysidro Her, RN Outcome: Progressing 06/08/2023 0414 by Ysidro Her, RN Outcome: Progressing   Problem: Nutrition: Goal: Adequate nutrition will be maintained 06/08/2023 0414 by Ysidro Her, RN Outcome: Progressing 06/08/2023 0414 by Ysidro Her, RN Outcome: Progressing   Problem: Coping: Goal: Level of anxiety will decrease 06/08/2023 0414 by Ysidro Her, RN Outcome: Progressing 06/08/2023 0414 by Ysidro Her, RN Outcome: Progressing   Problem: Elimination: Goal: Will not experience complications related to bowel motility 06/08/2023 0414 by Ysidro Her, RN Outcome: Progressing 06/08/2023 0414 by Ysidro Her, RN Outcome: Progressing Goal: Will not experience complications related to urinary retention 06/08/2023 0414 by Ysidro Her, RN Outcome: Progressing 06/08/2023 0414 by Ysidro Her, RN Outcome: Progressing   Problem: Pain Managment: Goal: General experience of comfort will improve and/or be controlled 06/08/2023 0414 by Ysidro Her, RN Outcome: Progressing 06/08/2023 0414 by Ysidro Her, RN Outcome: Progressing   Problem: Safety: Goal: Ability to remain free from injury will improve 06/08/2023 0414 by Ysidro Her, RN Outcome: Progressing 06/08/2023 0414 by Ysidro Her, RN Outcome: Progressing   Problem: Skin Integrity: Goal: Risk for impaired skin integrity will  decrease 06/08/2023 0414 by Ysidro Her, RN Outcome: Progressing 06/08/2023 0414 by Ysidro Her, RN Outcome: Progressing

## 2023-06-08 NOTE — Progress Notes (Signed)
  PROGRESS NOTE    Scott Hale  [id number]:[id number] DOB: [birth year] DOA: 06/07/2023 PCP: Lyle San, MD  140A/140A-AA  LOS: 0 days   Brief hospital course:   Assessment & Plan: Scott Hale is a 73 y.o. male with medical history significant of DM2, HTN, CKD who presented with right foot pain.    RLE cellulitis --no skin injury, non-purulent --received vanc/cefe/flagyl  in the ED, then switched to Ancef . --cont Ancef    Sepsis --fever, leukocytosis, source cellulitis   DM2 --recent A1c 7.6.  Pt reported taking Levemir 80u nightly at home. --order glargine 60u nightly --ACHS and SSI   HTN --hold home BP meds now   CKD 3a --Cr 1.85 on presentation, unclear if AKI or progression of CKD. --cont MIVF for now   DVT prophylaxis: Lovenox  SQ Code Status: Full code  Family Communication:  Level of care: Med-Surg Dispo:   The patient is from: home Anticipated d/c is to: home Anticipated d/c date is: 1-2 days   Subjective and Interval History:  Pt reported leg pain improved, and felt better.   Objective: Vitals:   06/07/23 2049 06/08/23 0431 06/08/23 0818 06/08/23 1650  BP: 99/60 115/64 117/65 133/74  Pulse: 75 69 71 70  Resp: 18 16 18 18   Temp: 98.2 F (36.8 C) 98.7 F (37.1 C) 99.1 F (37.3 C) 99.4 F (37.4 C)  TempSrc:      SpO2: 100% 100% 98% 99%  Weight:      Height:        Intake/Output Summary (Last 24 hours) at 06/08/2023 1816 Last data filed at 06/08/2023 0900 Gross per 24 hour  Intake 240 ml  Output 1200 ml  Net -960 ml   Filed Weights   06/07/23 1453  Weight: 108 kg    Examination:   Constitutional: NAD, AAOx3 HEENT: conjunctivae and lids normal, EOMI CV: No cyanosis.   RESP: normal respiratory effort, on RA Extremities: right lower leg and foot less edematous, less erythematous  SKIN: warm, dry Neuro: II - XII grossly intact.   Psych: Normal mood and affect.  Appropriate judgement and reason   Data Reviewed: I have personally reviewed  labs and imaging studies  Time spent: 35 minutes  Garrison Kanner, MD [facility] If 7PM-7AM, please contact night-coverage 06/08/2023, 6:16 PM

## 2023-06-09 DIAGNOSIS — L03115 Cellulitis of right lower limb: Secondary | ICD-10-CM | POA: Diagnosis not present

## 2023-06-09 LAB — BASIC METABOLIC PANEL WITH GFR
Anion gap: 9 (ref 5–15)
BUN: 12 mg/dL (ref 8–23)
CO2: 23 mmol/L (ref 22–32)
Calcium: 8.4 mg/dL — ABNORMAL LOW (ref 8.9–10.3)
Chloride: 99 mmol/L (ref 98–111)
Creatinine, Ser: 1.37 mg/dL — ABNORMAL HIGH (ref 0.61–1.24)
GFR, Estimated: 55 mL/min — ABNORMAL LOW (ref >60)
Glucose, Bld: 111 mg/dL — ABNORMAL HIGH (ref 70–99)
Potassium: 3.6 mmol/L (ref 3.5–5.1)
Sodium: 131 mmol/L — ABNORMAL LOW (ref 135–145)

## 2023-06-09 LAB — CBC
HCT: 34.5 % — ABNORMAL LOW (ref 39.0–52.0)
Hemoglobin: 11.8 g/dL — ABNORMAL LOW (ref 13.0–17.0)
MCH: 30.3 pg (ref 26.0–34.0)
MCHC: 34.2 g/dL (ref 30.0–36.0)
MCV: 88.7 fL (ref 80.0–100.0)
Platelets: 253 10*3/uL (ref 150–400)
RBC: 3.89 MIL/uL — ABNORMAL LOW (ref 4.22–5.81)
RDW: 13.5 % (ref 11.5–15.5)
WBC: 15.8 10*3/uL — ABNORMAL HIGH (ref 4.0–10.5)
nRBC: 0.1 % (ref 0.0–0.2)

## 2023-06-09 LAB — GLUCOSE, CAPILLARY
Glucose-Capillary: 124 mg/dL — ABNORMAL HIGH (ref 70–99)
Glucose-Capillary: 120 mg/dL — ABNORMAL HIGH (ref 70–99)
Glucose-Capillary: 106 mg/dL — ABNORMAL HIGH (ref 70–99)
Glucose-Capillary: 192 mg/dL — ABNORMAL HIGH (ref 70–99)

## 2023-06-09 MED ORDER — POLYETHYLENE GLYCOL 3350 17 G PO PACK
17.0000 g | PACK | Freq: Two times a day (BID) | ORAL | Status: DC
  Administered 2023-06-09 – 2023-06-10 (×2): 17 g via ORAL
  Filled 2023-06-09 (×3): qty 1

## 2023-06-09 NOTE — TOC Initial Note (Signed)
 Transition of Care [facility]) - Initial/Assessment Note    Patient Details  Name: Scott Hale MRN: [id number] Date of Birth: [birth year]  Transition of Care [facility]) CM/SW Contact:    Alexandra Ice, RN Phone Number: 06/09/2023, 2:47 PM  Clinical Narrative:                 Transition of Care [facility]) - Inpatient Brief Assessment   Patient Details  Name: Scott Hale MRN: [id number] Date of Birth: 1950/02/28  Transition of Care [facility]) CM/SW Contact:    Alexandra Ice, RN Phone Number: 06/09/2023, 2:47 PM   Clinical Narrative: Brief assessment completed. Food and utilities resources added to AVS. TOC will continue to monitor for additional discharge needs   Transition of Care Asessment: Insurance and Status: Insurance coverage has been reviewed Patient has primary care physician: Yes Home environment has been reviewed: lives alone Prior level of function:: independent Prior/Current Home Services: No current home services Social Drivers of Health Review: SDOH reviewed no interventions necessary Readmission risk has been reviewed: Yes Transition of care needs: no transition of care needs at this time         Patient Goals and CMS Choice            Expected Discharge Plan and Services                                              Prior Living Arrangements/Services                       Activities of Daily Living   ADL Screening (condition at time of admission) Independently performs ADLs?: Yes (appropriate for developmental age) Is the patient deaf or have difficulty hearing?: No Does the patient have difficulty seeing, even when wearing glasses/contacts?: No Does the patient have difficulty concentrating, remembering, or making decisions?: No  Permission Sought/Granted                  Emotional Assessment              Admission diagnosis:  Cellulitis [L03.90] Cellulitis of right foot [L03.115] Sepsis, due to  unspecified organism, unspecified whether acute organ dysfunction present [facility]) [A41.9] Patient Active Problem List   Diagnosis Date Noted   Cellulitis 06/07/2023   HTN (hypertension) 03/28/2017   Chronic CHF (congestive heart failure) (HCC) 03/28/2017   Diabetes (HCC) 03/28/2017   Snoring 03/28/2017   PCP:  Lyle San, MD Pharmacy:   [facility] [address] [place] - [address] [place] [address] Phone: [phone] Fax: [phone]  [facility] - [place], [place] - [address] [place] [address] Phone: [phone] Fax: [phone]  [facility] - [place], [place] - [address] [place] [address] Phone: [phone] Fax: [phone]  [facility] [place], [place] - [address] [place] [address] Phone: [phone] Fax: [phone]  [facility] - [address] [place] - [address] [place] [address] Phone: [phone] Fax: [phone]     Social Drivers of Health (SDOH) Social History: SDOH Screenings   Food Insecurity: Food Insecurity Present (06/08/2023)  Housing: Low Risk  (06/08/2023)  Recent Concern: Housing - High Risk (05/27/2023)  Received from [facility]  Transportation Needs: No Transportation Needs (06/08/2023)  Utilities: At Risk (06/08/2023)  Depression (PHQ2-9): Low Risk  (09/25/2021)  Financial Resource Strain: Medium Risk (12/05/2021)   [facility] from [facility], [facility]  Physical Activity: Sufficiently Active (03/26/2017)  Social Connections: Moderately Isolated (06/08/2023)  Stress: No Stress Concern Present (03/26/2017)  Tobacco Use: Low Risk  (06/07/2023)   SDOH Interventions:     Readmission Risk Interventions     No data to display

## 2023-06-09 NOTE — Discharge Instructions (Addendum)
 [facility] They will call you to set up when they are coming out to see you   [address], [place], [place] [address] Hours:  Open ? Closes 5?PM Phone: [phone]        Instructions after Total Knee Replacement   Reinaldo Berber M.D.     Dept. of Orthopaedics & Sports Medicine  [facility]  [address]  [place], [place]  [address]  Phone: [phone]   Fax: [phone]    DIET: Drink plenty of non-alcoholic fluids. Resume your normal diet. Include foods high in fiber.  ACTIVITY:  You may use crutches or a walker with weight-bearing as tolerated, unless instructed otherwise. You may be weaned off of the walker or crutches by your Physical Therapist.  Do NOT place pillows under the knee. Anything placed under the knee could limit your ability to straighten the knee.   Continue doing gentle exercises. Exercising will reduce the pain and swelling, increase motion, and prevent muscle weakness.   Please continue to use the TED compression stockings for 2 weeks. You may remove the stockings at night, but should reapply them in the morning. Do not drive or operate any equipment until instructed.  WOUND CARE:  Continue to use the PolarCare or ice packs periodically to reduce pain and swelling. You may begin showering 3 days after surgery with honeycomb dressing. Remove honeycomb dressing 7 days after surgery and continue showering. Allow dermabond to fall off on its own.  MEDICATIONS: You may resume your regular medications. Please take the pain medication as prescribed on the medication. Do not take pain medication on an empty stomach. You have been given a prescription for a blood thinner (Lovenox or Coumadin). Please take the medication as instructed. (NOTE: After completing a 2 week course of Lovenox, take one 81 mg Enteric-coated aspirin twice a day for 3 additional weeks. This along with elevation will help reduce the possibility of phlebitis in your operated  leg.) Do not drive or drink alcoholic beverages when taking pain medications.  POSTOPERATIVE CONSTIPATION PROTOCOL Constipation - defined medically as fewer than three stools per week and severe constipation as less than one stool per week.  One of the most common issues patients have following surgery is constipation.  Even if you have a regular bowel pattern at home, your normal regimen is likely to be disrupted due to multiple reasons following surgery.  Combination of anesthesia, postoperative narcotics, change in appetite and fluid intake all can affect your bowels.  In order to avoid complications following surgery, here are some recommendations in order to help you during your recovery period.  Colace (docusate) - Pick up an over-the-counter form of Colace or another stool softener and take twice a day as long as you are requiring postoperative pain medications.  Take with a full glass of water daily.  If you experience loose stools or diarrhea, hold the colace until you stool forms back up.  If your symptoms do not get better within 1 week or if they get worse, check with your doctor.  Dulcolax (bisacodyl) - Pick up over-the-counter and take as directed by the product packaging as needed to assist with the movement of your bowels.  Take with a full glass of water.  Use this product as needed if not relieved by Colace only.   MiraLax (polyethylene glycol) - Pick up over-the-counter to have on hand.  MiraLax is a solution that will increase the amount of water in your bowels to assist with  bowel movements.  Take as directed and can mix with a glass of water, juice, soda, coffee, or tea.  Take if you go more than two days without a movement. Do not use MiraLax more than once per day. Call your doctor if you are still constipated or irregular after using this medication for 7 days in a row.  If you continue to have problems with postoperative constipation, please contact the office for further  assistance and recommendations.  If you experience "the worst abdominal pain ever" or develop nausea or vomiting, please contact the office immediatly for further recommendations for treatment.   CALL THE OFFICE FOR: Temperature above 101 degrees Excessive bleeding or drainage on the dressing. Excessive swelling, coldness, or paleness of the toes. Persistent nausea and vomiting.  FOLLOW-UP:  You should have an appointment to return to the office in 14 days after surgery. Arrangements have been made for continuation of Physical Therapy (either home therapy or outpatient therapy).

## 2023-06-09 NOTE — Progress Notes (Signed)
  PROGRESS NOTE    Scott Hale  AVW:[id number] DOB: 1950-11-14 DOA: 06/07/2023 PCP: Lyle San, MD  140A/140A-AA  LOS: 1 day   Brief hospital course:   Assessment & Plan: Scott Hale is a 73 y.o. male with medical history significant of DM2, HTN, CKD who presented with right foot pain.    RLE cellulitis --no skin injury, non-purulent --received vanc/cefe/flagyl  in the ED, then switched to Ancef . --cont Ancef    Sepsis --fever, leukocytosis, source cellulitis   DM2 --recent A1c 7.6.  Pt reported taking Levemir 80u nightly at home. --cont glargine 60u nightly --ACHS and SSI   HTN --hold home BP meds now   AKI on CKD 3a --Cr 1.85 on presentation, improved to 1.37 with IVF. --oral hydration now   DVT prophylaxis: Lovenox  SQ Code Status: Full code  Family Communication: friend/care giver updated at bedside today Level of care: Med-Surg Dispo:   The patient is from: home Anticipated d/c is to: home Anticipated d/c date is: 1-2 days   Subjective and Interval History:  Leg pain improved, but still sore.   Objective: Vitals:   06/08/23 2243 06/09/23 0513 06/09/23 0743 06/09/23 1518  BP: 129/75 117/64 106/63 (!) 146/72  Pulse: 80 71 73 78  Resp: 18 18 18 16   Temp: 99.7 F (37.6 C) 97.8 F (36.6 C) 98.8 F (37.1 C) 99.7 F (37.6 C)  TempSrc: Oral Oral    SpO2: 100% 99% 98% 99%  Weight:      Height:        Intake/Output Summary (Last 24 hours) at 06/09/2023 1816 Last data filed at 06/09/2023 1453 Gross per 24 hour  Intake 940 ml  Output 1950 ml  Net -1010 ml   Filed Weights   06/07/23 1453  Weight: 108 kg    Examination:   Constitutional: NAD, AAOx3 HEENT: conjunctivae and lids normal, EOMI CV: No cyanosis.   RESP: normal respiratory effort, on RA Extremities: RLE still more edematous and warm SKIN: warm, dry Neuro: II - XII grossly intact.   Psych: Normal mood and affect.  Appropriate judgement and reason   Data Reviewed: I have  personally reviewed labs and imaging studies  Time spent: 35 minutes  Garrison Kanner, MD [facility] If 7PM-7AM, please contact night-coverage 06/09/2023, 6:16 PM

## 2023-06-10 ENCOUNTER — Inpatient Hospital Stay

## 2023-06-10 DIAGNOSIS — L539 Erythematous condition, unspecified: Secondary | ICD-10-CM

## 2023-06-10 DIAGNOSIS — L089 Local infection of the skin and subcutaneous tissue, unspecified: Secondary | ICD-10-CM

## 2023-06-10 DIAGNOSIS — E11628 Type 2 diabetes mellitus with other skin complications: Secondary | ICD-10-CM

## 2023-06-10 DIAGNOSIS — M7989 Other specified soft tissue disorders: Secondary | ICD-10-CM

## 2023-06-10 DIAGNOSIS — I96 Gangrene, not elsewhere classified: Secondary | ICD-10-CM | POA: Diagnosis not present

## 2023-06-10 DIAGNOSIS — L03115 Cellulitis of right lower limb: Secondary | ICD-10-CM | POA: Diagnosis not present

## 2023-06-10 LAB — BASIC METABOLIC PANEL WITH GFR
Anion gap: 9 (ref 5–15)
BUN: 10 mg/dL (ref 8–23)
CO2: 26 mmol/L (ref 22–32)
Calcium: 8.3 mg/dL — ABNORMAL LOW (ref 8.9–10.3)
Chloride: 101 mmol/L (ref 98–111)
Creatinine, Ser: 1.34 mg/dL — ABNORMAL HIGH (ref 0.61–1.24)
GFR, Estimated: 57 mL/min — ABNORMAL LOW (ref >60)
Glucose, Bld: 64 mg/dL — ABNORMAL LOW (ref 70–99)
Potassium: 3.8 mmol/L (ref 3.5–5.1)
Sodium: 136 mmol/L (ref 135–145)

## 2023-06-10 LAB — CBC
HCT: 32.9 % — ABNORMAL LOW (ref 39.0–52.0)
Hemoglobin: 11.4 g/dL — ABNORMAL LOW (ref 13.0–17.0)
MCH: 30.3 pg (ref 26.0–34.0)
MCHC: 34.7 g/dL (ref 30.0–36.0)
MCV: 87.5 fL (ref 80.0–100.0)
Platelets: 283 10*3/uL (ref 150–400)
RBC: 3.76 MIL/uL — ABNORMAL LOW (ref 4.22–5.81)
RDW: 13.3 % (ref 11.5–15.5)
WBC: 16.4 10*3/uL — ABNORMAL HIGH (ref 4.0–10.5)
nRBC: 0.1 % (ref 0.0–0.2)

## 2023-06-10 LAB — HEMOGLOBIN A1C
Hgb A1c MFr Bld: 7.5 % — ABNORMAL HIGH (ref 4.8–5.6)
Mean Plasma Glucose: 168.55 mg/dL

## 2023-06-10 LAB — GLUCOSE, CAPILLARY
Glucose-Capillary: 145 mg/dL — ABNORMAL HIGH (ref 70–99)
Glucose-Capillary: 79 mg/dL (ref 70–99)
Glucose-Capillary: 66 mg/dL — ABNORMAL LOW (ref 70–99)
Glucose-Capillary: 124 mg/dL — ABNORMAL HIGH (ref 70–99)
Glucose-Capillary: 160 mg/dL — ABNORMAL HIGH (ref 70–99)

## 2023-06-10 MED ORDER — INSULIN ASPART 100 UNIT/ML IJ SOLN
0.0000 [IU] | Freq: Three times a day (TID) | INTRAMUSCULAR | Status: DC
  Administered 2023-06-11 (×2): 2 [IU] via SUBCUTANEOUS
  Administered 2023-06-12: 1 [IU] via SUBCUTANEOUS
  Administered 2023-06-12: 2 [IU] via SUBCUTANEOUS
  Administered 2023-06-13 – 2023-06-14 (×2): 5 [IU] via SUBCUTANEOUS
  Administered 2023-06-15: 3 [IU] via SUBCUTANEOUS
  Administered 2023-06-15: 5 [IU] via SUBCUTANEOUS
  Administered 2023-06-15: 7 [IU] via SUBCUTANEOUS
  Administered 2023-06-16: 2 [IU] via SUBCUTANEOUS
  Administered 2023-06-16 – 2023-06-17 (×3): 1 [IU] via SUBCUTANEOUS
  Administered 2023-06-17: 3 [IU] via SUBCUTANEOUS
  Administered 2023-06-18: 1 [IU] via SUBCUTANEOUS
  Administered 2023-06-18: 3 [IU] via SUBCUTANEOUS
  Administered 2023-06-18 – 2023-06-19 (×2): 2 [IU] via SUBCUTANEOUS
  Administered 2023-06-19: 3 [IU] via SUBCUTANEOUS
  Filled 2023-06-10 (×20): qty 1

## 2023-06-10 MED ORDER — INSULIN GLARGINE-YFGN 100 UNIT/ML ~~LOC~~ SOLN
57.0000 [IU] | Freq: Every day | SUBCUTANEOUS | Status: DC
  Administered 2023-06-10: 57 [IU] via SUBCUTANEOUS
  Filled 2023-06-10: qty 0.57

## 2023-06-10 MED ORDER — GADOBUTROL 1 MMOL/ML IV SOLN
10.0000 mL | Freq: Once | INTRAVENOUS | Status: AC | PRN
  Administered 2023-06-10: 10 mL via INTRAVENOUS

## 2023-06-10 MED ORDER — POLYETHYLENE GLYCOL 3350 17 G PO PACK
34.0000 g | PACK | Freq: Two times a day (BID) | ORAL | Status: DC
  Administered 2023-06-10 – 2023-06-11 (×3): 34 g via ORAL
  Filled 2023-06-10 (×3): qty 2

## 2023-06-10 MED ORDER — LINEZOLID 600 MG/300ML IV SOLN
600.0000 mg | Freq: Two times a day (BID) | INTRAVENOUS | Status: DC
  Administered 2023-06-10 – 2023-06-17 (×12): 600 mg via INTRAVENOUS
  Filled 2023-06-10 (×14): qty 300

## 2023-06-10 MED ORDER — SODIUM CHLORIDE 0.9 % IV SOLN
3.0000 g | Freq: Four times a day (QID) | INTRAVENOUS | Status: DC
  Administered 2023-06-10 – 2023-06-12 (×7): 3 g via INTRAVENOUS
  Filled 2023-06-10 (×9): qty 8

## 2023-06-10 NOTE — Consult Note (Signed)
 NAME: Scott Hale  DOB: 08-Sep-1950  MRN: [id number]  Date/Time: 06/10/2023 12:03 PM  REQUESTING PROVIDER: Cloyce Darby Subjective:  REASON FOR CONSULT: Cellulitis rt leg ? Scott Hale is a 73 y.o. with a history of DM, HTN, HLD , CAD s/p CABG, RT TKA, CKD presents with 2 week h/o pain on top of the rt foot 2 weeks ago he developed pain which he thought was due to working out in [employer] and stepping off the equipment It progressed and became so severe that he could not weight bear and came to the ED  He did not feel hot   06/07/23 14:49  BP 127/76  Temp 100.5 F (38.1 C) !  Pulse Rate 89  Resp 18  SpO2 99 %     Latest Reference Range & Units 06/07/23 16:53  WBC 4.0 - 10.5 K/uL 15.6 (H)  Hemoglobin 13.0 - 17.0 g/dL 19.1  HCT 47.8 - 29.5 % 38.9 (L)  Platelets 150 - 400 K/uL 255  Creatinine 0.61 - 1.24 mg/dL 6.21 (H)  Blood culture sent He received vanco , cefepime  and flagyl  1 dose and then switched to cefazolin  for cellulitis Xray of the foot done on admission showed some mild arthritis  As he continues to have fever and leucocytosis I am asked to see patient  Past Medical History:  Diagnosis Date   Arthritis    CHF (congestive heart failure) (HCC)    Diabetes mellitus without complication (HCC)    Erectile dysfunction    History of kidney stones    Hypertension    Sleep apnea     Past Surgical History:  Procedure Laterality Date   CARDIAC CATHETERIZATION     COLONOSCOPY N/A 03/07/2021   Procedure: COLONOSCOPY;  Surgeon: [name], MD;  Location: [facility] ENDOSCOPY;  Service: Endoscopy;  Laterality: N/A;  IDDM   COLONOSCOPY WITH PROPOFOL  N/A 04/09/2017   Procedure: COLONOSCOPY WITH PROPOFOL ;  Surgeon: [name], MD;  Location: [facility] ENDOSCOPY;  Service: Gastroenterology;  Laterality: N/A;   CORONARY ARTERY BYPASS GRAFT  2003   JOINT REPLACEMENT     total knee   RENAL ARTERY STENT     due to kidney stones    Social History   Socioeconomic History   Marital  status: Single    Spouse name: Not on file   Number of children: 5   Years of education: 12   Highest education level: 12th grade  Occupational History   Occupation: retired  Tobacco Use   Smoking status: Never   Smokeless tobacco: Never  Vaping Use   Vaping status: Never Used  Substance and Sexual Activity   Alcohol use: No   Drug use: No   Sexual activity: Yes    Birth control/protection: Condom  Other Topics Concern   Not on file  Social History Narrative   Not on file   Social Drivers of Health   Financial Resource Strain: Medium Risk (12/05/2021)   Received from [facility], [facility]   Overall Financial Resource Strain (CARDIA)    Difficulty of Paying Living Expenses: Somewhat hard  Food Insecurity: Food Insecurity Present (06/08/2023)   Hunger Vital Sign    Worried About Running Out of Food in the Last Year: Sometimes true    Ran Out of Food in the Last Year: Never true  Transportation Needs: No Transportation Needs (06/08/2023)   PRAPARE - Administrator, Civil Service (Medical): No    Lack of Transportation (Non-Medical): No  Physical Activity: Sufficiently Active (03/26/2017)   Exercise Vital Sign    Days of Exercise per Week: 7 days    Minutes of Exercise per Session: 60 min  Stress: No Stress Concern Present (03/26/2017)   [employer] of Occupational Health - Occupational Stress Questionnaire    Feeling of Stress : Only a little  Social Connections: Moderately Isolated (06/08/2023)   Social Connection and Isolation Panel [NHANES]    Frequency of Communication with Friends and Family: More than three times a week    Frequency of Social Gatherings with Friends and Family: Once a week    Attends Religious Services: More than 4 times per year    Active Member of [employer] or Organizations: No    Attends Banker Meetings: Never    Marital Status: Never married  Intimate Partner Violence: Not At Risk  (06/08/2023)   Humiliation, Afraid, Rape, and Kick questionnaire    Fear of Current or Ex-Partner: No    Emotionally Abused: No    Physically Abused: No    Sexually Abused: No    Family History  Problem Relation Age of Onset   Diabetes Mother    Alcoholism Father    Diabetes Sister    Diabetes Sister    Arrhythmia Sister    Diabetes Sister    No Known Allergies I? Current Facility-Administered Medications  Medication Dose Route Frequency Provider Last Rate Last Admin   aspirin chewable tablet 81 mg  81 mg Oral Daily Garrison Kanner, MD   81 mg at 06/10/23 0919   ceFAZolin  (ANCEF ) IVPB 2g/100 mL premix  2 g Intravenous Q8H Garrison Kanner, MD 200 mL/hr at 06/10/23 0510 2 g at 06/10/23 0510   enoxaparin  (LOVENOX ) injection 50 mg  50 mg Subcutaneous Q24H Garrison Kanner, MD   50 mg at 06/09/23 2136   insulin  aspart (novoLOG) injection 0-15 Units  0-15 Units Subcutaneous TID [facility] Garrison Kanner, MD   2 Units at 06/10/23 2956   insulin  aspart (novoLOG) injection 0-5 Units  0-5 Units Subcutaneous QHS Garrison Kanner, MD       insulin  glargine-yfgn (SEMGLEE ) injection 60 Units  60 Units Subcutaneous QHS Garrison Kanner, MD   60 Units at 06/09/23 2136   polyethylene glycol (MIRALAX / GLYCOLAX) packet 17 g  17 g Oral BID Garrison Kanner, MD   17 g at 06/10/23 0919   traMADol  (ULTRAM ) tablet 50-100 mg  50-100 mg Oral Q6H PRN Garrison Kanner, MD   100 mg at 06/09/23 2136     Abtx:  Anti-infectives (From admission, onward)    Start     Dose/Rate Route Frequency Ordered Stop   06/07/23 2200  ceFAZolin  (ANCEF ) IVPB 2g/100 mL premix        2 g 200 mL/hr over 30 Minutes Intravenous Every 8 hours 06/07/23 1835 06/14/23 2159   06/07/23 1715  ceFEPIme  (MAXIPIME ) 2 g in sodium chloride  0.9 % 100 mL IVPB  Status:  Discontinued        2 g 200 mL/hr over 30 Minutes Intravenous  Once 06/07/23 1706 06/07/23 1714   06/07/23 1715  ceFEPIme  (MAXIPIME ) 2 g in sodium chloride  0.9 % 100 mL IVPB        2 g 200 mL/hr over 30 Minutes Intravenous  Once  06/07/23 1714 06/07/23 1815   06/07/23 1715  metroNIDAZOLE  (FLAGYL ) IVPB 500 mg  Status:  Discontinued        500 mg 100 mL/hr over 60 Minutes Intravenous  Once 06/07/23 1714 06/07/23  1831   06/07/23 1715  vancomycin  (VANCOCIN ) IVPB 1000 mg/200 mL premix        1,000 mg 200 mL/hr over 60 Minutes Intravenous  Once 06/07/23 1714 06/07/23 1933       REVIEW OF SYSTEMS:  Const: negative fever, negative chills, negative weight loss Eyes: negative diplopia or visual changes, negative eye pain ENT: negative coryza, negative sore throat Resp: negative cough, hemoptysis, dyspnea Cards: negative for chest pain, palpitations, lower extremity edema GU: negative for frequency, dysuria and hematuria GI: Negative for abdominal pain, diarrhea, bleeding, constipation Skin: negative for rash and pruritus Heme: negative for easy bruising and gum/nose bleeding MS:  as above Neurolo:negative for headaches, dizziness, vertigo, memory problems  Psych: negative for feelings of anxiety, depression  Endocrine:  diabetes Allergy/Immunology- negative for any medication or food allergies  Objective:  VITALS:  BP 132/71 (BP Location: Left Arm)   Pulse 75   Temp 99.3 F (37.4 C) (Oral)   Resp 18   Ht 6' (1.829 m)   Wt 108 kg   SpO2 100%   BMI 32.28 kg/m    PHYSICAL EXAM:  General: Alert, cooperative, no distress, appears stated age.  Head: Normocephalic, without obvious abnormality, atraumatic. Eyes: Conjunctivae clear, anicteric sclerae. Pupils are equal ENT Nares normal. No drainage or sinus tenderness. Lips, mucosa, and tongue normal. No Thrush Neck: Supple, symmetrical, no adenopathy, thyroid: non tender no carotid bruit and no JVD. Back: No CVA tenderness. Lungs: Clear to auscultation bilaterally. No Wheezing or Rhonchi. No rales. Heart: Regular rate and rhythm, no murmur, rub or gallop. Abdomen: Soft, non-tender,not distended. Bowel sounds normal. No masses Extremities: Rt knee scar Rt  leg and foot edematous Shiny Erythema and tenderness over rt foot 4th toe- has some discoloration and peeling of skin- 3/4 cleft has discharge and some          Skin: No rashes or lesions. Or bruising Lymph: Cervical, supraclavicular normal. Neurologic: Grossly non-focal Pertinent Labs Lab Results CBC    Component Value Date/Time   WBC 16.4 (H) 06/10/2023 0530   RBC 3.76 (L) 06/10/2023 0530   HGB 11.4 (L) 06/10/2023 0530   HCT 32.9 (L) 06/10/2023 0530   PLT 283 06/10/2023 0530   MCV 87.5 06/10/2023 0530   MCH 30.3 06/10/2023 0530   MCHC 34.7 06/10/2023 0530   RDW 13.3 06/10/2023 0530   LYMPHSABS 1.4 06/07/2023 1653   MONOABS 1.5 (H) 06/07/2023 1653   EOSABS 0.1 06/07/2023 1653   BASOSABS 0.0 06/07/2023 1653       Latest Ref Rng & Units 06/10/2023    5:30 AM 06/09/2023    4:54 AM 06/08/2023    4:40 AM  CMP  Glucose 70 - 99 mg/dL 64  784  696   BUN 8 - 23 mg/dL 10  12  13    Creatinine 0.61 - 1.24 mg/dL 2.95  2.84  1.32   Sodium 135 - 145 mmol/L 136  131  131   Potassium 3.5 - 5.1 mmol/L 3.8  3.6  3.5   Chloride 98 - 111 mmol/L 101  99  103   CO2 22 - 32 mmol/L 26  23  24    Calcium 8.9 - 10.3 mg/dL 8.3  8.4  8.1    ESR 440   Microbiology: Recent Results (from the past 240 hours)  Resp panel by RT-PCR (RSV, Flu A&B, Covid) Anterior Nasal Swab     Status: None   Collection Time: 06/07/23  4:53 PM   Specimen: Anterior  Nasal Swab  Result Value Ref Range Status   SARS Coronavirus 2 by RT PCR NEGATIVE NEGATIVE Final    Comment: (NOTE) SARS-CoV-2 target nucleic acids are NOT DETECTED.  The SARS-CoV-2 RNA is generally detectable in upper respiratory specimens during the acute phase of infection. The lowest concentration of SARS-CoV-2 viral copies this assay can detect is 138 copies/mL. A negative result does not preclude SARS-Cov-2 infection and should not be used as the sole basis for treatment or other patient management decisions. A negative result may occur  with  improper specimen collection/handling, submission of specimen other than nasopharyngeal swab, presence of viral mutation(s) within the areas targeted by this assay, and inadequate number of viral copies(<138 copies/mL). A negative result must be combined with clinical observations, patient history, and epidemiological information. The expected result is Negative.  Fact Sheet for Patients:  BloggerCourse.com  Fact Sheet for Healthcare Providers:  SeriousBroker.it  This test is no t yet approved or cleared by the [place]  FDA and  has been authorized for detection and/or diagnosis of SARS-CoV-2 by FDA under an Emergency Use Authorization (EUA). This EUA will remain  in effect (meaning this test can be used) for the duration of the COVID-19 declaration under Section 564(b)(1) of the Act, 21 U.S.C.section 360bbb-3(b)(1), unless the authorization is terminated  or revoked sooner.       Influenza A by PCR NEGATIVE NEGATIVE Final   Influenza B by PCR NEGATIVE NEGATIVE Final    Comment: (NOTE) The Xpert Xpress SARS-CoV-2/FLU/RSV plus assay is intended as an aid in the diagnosis of influenza from Nasopharyngeal swab specimens and should not be used as a sole basis for treatment. Nasal washings and aspirates are unacceptable for Xpert Xpress SARS-CoV-2/FLU/RSV testing.  Fact Sheet for Patients: BloggerCourse.com  Fact Sheet for Healthcare Providers: SeriousBroker.it  This test is not yet approved or cleared by the [place]  FDA and has been authorized for detection and/or diagnosis of SARS-CoV-2 by FDA under an Emergency Use Authorization (EUA). This EUA will remain in effect (meaning this test can be used) for the duration of the COVID-19 declaration under Section 564(b)(1) of the Act, 21 U.S.C. section 360bbb-3(b)(1), unless the authorization is terminated  or revoked.     Resp Syncytial Virus by PCR NEGATIVE NEGATIVE Final    Comment: (NOTE) Fact Sheet for Patients: BloggerCourse.com  Fact Sheet for Healthcare Providers: SeriousBroker.it  This test is not yet approved or cleared by the [place]  FDA and has been authorized for detection and/or diagnosis of SARS-CoV-2 by FDA under an Emergency Use Authorization (EUA). This EUA will remain in effect (meaning this test can be used) for the duration of the COVID-19 declaration under Section 564(b)(1) of the Act, 21 U.S.C. section 360bbb-3(b)(1), unless the authorization is terminated or revoked.  Performed at [facility], [address] [place], [place] [address]   Culture, blood (routine x 2)     Status: None (Preliminary result)   Collection Time: 06/07/23  5:25 PM   Specimen: BLOOD  Result Value Ref Range Status   Specimen Description BLOOD RIGHT ANTECUBITAL  Final   Special Requests   Final    BOTTLES DRAWN AEROBIC AND ANAEROBIC Blood Culture results may not be optimal due to an inadequate volume of blood received in culture bottles   Culture   Final    NO GROWTH 3 DAYS Performed at [facility], [address], [place], [place] [address]    Report Status PENDING  Incomplete  Culture, blood (  routine x 2)     Status: None (Preliminary result)   Collection Time: 06/07/23  5:27 PM   Specimen: BLOOD  Result Value Ref Range Status   Specimen Description BLOOD LEFT ANTECUBITAL  Final   Special Requests   Final    BOTTLES DRAWN AEROBIC AND ANAEROBIC Blood Culture results may not be optimal due to an inadequate volume of blood received in culture bottles   Culture   Final    NO GROWTH 3 DAYS Performed at [facility], [address] [place], [place] [address]    Report Status PENDING  Incomplete    IMAGING RESULTS: Xray foot no osteo I have personally reviewed the  films ? Impression/Recommendation ?73 yr male with DM presents with pain rt foot of 2 weeks duration  Rt foot swelling and erythema and tenderness with interdigital cleft maceration between 3/4 th toe with discharge 4th toe discolored R/o soft tissue infection of the rt foot With interdigital infection- bacterial VS fungal There is some discoloration- need to r/oPAD Pt is currently on cefazolin  Need MRI of the foot and podiatry consult Depending on the MRI result will need I/D and culture Would add MRSA coverage with linezolid and change cefazolin  to unasyn DM- no recent A1c  Htn  CAD s/p CABG RT TKA) no evidence of PJI ? I have personally spent  -75--minutes involved in face-to-face and non-face-to-face activities for this patient on the day of the visit. Professional time spent includes the following activities: Preparing to see the patient (review of tests), Obtaining and/or reviewing separately obtained history (admission/discharge record), Performing a medically appropriate examination and/or evaluation , Ordering medications/tests/procedures, referring and communicating with other health care professionals, Documenting clinical information in the EMR, Independently interpreting results (not separately reported), Communicating results to the patient/family/caregiver, Counseling and educating the patient/family/caregiver and Care coordination (not separately reported).   This involved complex antimicrobial management  ________________________________________________

## 2023-06-10 NOTE — Progress Notes (Signed)
  PROGRESS NOTE    Scott Hale  OZH:[id number] DOB: [birth year] DOA: 06/07/2023 PCP: Lyle San, MD  140A/140A-AA  LOS: 2 days   Brief hospital course:   Assessment & Plan: Scott Hale is a 73 y.o. male with medical history significant of DM2, HTN, CKD who presented with right foot pain.    RLE cellulitis and osteomyelitis --received vanc/cefe/flagyl  in the ED, then switched to Ancef . --wounds opened up in between toes today Plan: --ID and podiatry consult today --MRI right foot showed multiple areas of osteomyelitis --ABI --wound care per podiatry order --switch abx to Unasyn and Zyvox, per ID   Sepsis --fever, leukocytosis, source cellulitis   DM2 --recent A1c 7.6.  Pt reported taking Levemir 80u nightly at home. --reduce glargine to 57u nightly --ACHS and SSI   HTN --hold home BP meds now   AKI on CKD 3a --Cr 1.85 on presentation, improved to 1.37 with IVF. --oral hydration now   DVT prophylaxis: Lovenox  SQ Code Status: Full code  Family Communication: friend/care giver updated at bedside today Level of care: Med-Surg Dispo:   The patient is from: home Anticipated d/c is to: home Anticipated d/c date is: > 3 days   Subjective and Interval History:  Developed fever early this morning.  Pt reported no pain in his right foot at rest, but significant pain with walking.    Wounds opened up in between his toes today.  MRI showed multiple areas of osteomyelitis.    ID and podiatry consult today.   Objective: Vitals:   06/10/23 0508 06/10/23 0906 06/10/23 1100 06/10/23 1538  BP: 120/70 132/71  (!) 152/81  Pulse: 75 75  70  Resp: 20 18  18   Temp: (!) 100.4 F (38 C) 100.3 F (37.9 C) 99.3 F (37.4 C) 99.2 F (37.3 C)  TempSrc: Oral  Oral   SpO2: 97% 100%  100%  Weight:      Height:        Intake/Output Summary (Last 24 hours) at 06/10/2023 1908 Last data filed at 06/10/2023 1100 Gross per 24 hour  Intake 300 ml  Output 1520 ml  Net -1220 ml    Filed Weights   06/07/23 1453  Weight: 108 kg    Examination:   Constitutional: NAD, AAOx3 HEENT: conjunctivae and lids normal, EOMI CV: No cyanosis.   RESP: normal respiratory effort, on RA Neuro: II - XII grossly intact.   Psych: Normal mood and affect.  Appropriate judgement and reason      Data Reviewed: I have personally reviewed labs and imaging studies  Time spent: 50 minutes  Garrison Kanner, MD [facility] If 7PM-7AM, please contact night-coverage 06/10/2023, 7:08 PM

## 2023-06-10 NOTE — Progress Notes (Addendum)
 Mobility Specialist - Progress Note   06/10/23 1127  Mobility  Activity Ambulated with assistance in room;Stood at bedside  Level of Assistance Contact guard assist, steadying assist  Assistive Device Front wheel walker  Distance Ambulated (ft) 10 ft  Activity Response Tolerated well  Mobility visit 1 Mobility     Pt lying in bed upon arrival, utilizing RA. Pt agreeable to activity. Pt states he was independent without AD PTA. Completed bed mobility independently. STS from standard bed height with minA + extra time. Pt attempted first STS without hands on assist, at pt request, and pt did experience posterior LOB. VC for hand and foot placement to alleviate pain in RLE. Pain 9/10 in "center base of foot" with activity. Pt able to take several lateral/forward/retro steps with cueing for sequence. Heavy UE reliance on AD. Slightly narrow BOS. Pt returned to bed with alarm set, needs in reach.    Searcy Czech Mobility Specialist 06/10/23, 11:32 AM

## 2023-06-10 NOTE — Consult Note (Signed)
 PODIATRY CONSULTATION  NAME Scott Hale MRN [id number] DOB 09-May-1950 DOA 06/07/2023   Reason for consult:  Chief Complaint  Patient presents with   Foot Pain    Attending/Consulting physician:   History of present illness: "Scott Hale is a 73 y.o. male with medical history significant of DM2, HTN, CKD who presented with right foot pain.    Pt reported pain in his right foot started about 2 weeks ago.  Yesterday, right foot started swelling, became red and developed streaking up towards the knee.  No known injury or bug bite to the right LE.  Pt has a prosthetic right knee.  "  Discussed with patient bedside regarding his changes to his right foot.  He says that he has noticed some discoloration changes to the 3rd through 5th toe recently.  That is a new change for him.  He denies a smoking history.  He does report diabetes with some peripheral neuropathy though not severe and does have sensation in his toes he states.  Past Medical History:  Diagnosis Date   Arthritis    CHF (congestive heart failure) (HCC)    Diabetes mellitus without complication (HCC)    Erectile dysfunction    History of kidney stones    Hypertension    Sleep apnea        Latest Ref Rng & Units 06/10/2023    5:30 AM 06/09/2023    4:54 AM 06/08/2023    4:40 AM  CBC  WBC 4.0 - 10.5 K/uL 16.4  15.8  15.0   Hemoglobin 13.0 - 17.0 g/dL 40.9  81.1  91.4   Hematocrit 39.0 - 52.0 % 32.9  34.5  34.1   Platelets 150 - 400 K/uL 283  253  226        Latest Ref Rng & Units 06/10/2023    5:30 AM 06/09/2023    4:54 AM 06/08/2023    4:40 AM  BMP  Glucose 70 - 99 mg/dL 64  782  956   BUN 8 - 23 mg/dL 10  12  13    Creatinine 0.61 - 1.24 mg/dL 2.13  0.86  5.78   Sodium 135 - 145 mmol/L 136  131  131   Potassium 3.5 - 5.1 mmol/L 3.8  3.6  3.5   Chloride 98 - 111 mmol/L 101  99  103   CO2 22 - 32 mmol/L 26  23  24    Calcium 8.9 - 10.3 mg/dL 8.3  8.4  8.1       Physical Exam: Lower Extremity Exam Vasc: R -  PT non palpable, DP non palpable.  Digits feel cool to touch  L - PT non palpable, DP non palpable.  Digits feel cool to touch  Derm: R -cleansed in between the 2nd and 3rd 3rd and 4th and 4th and 5th toes on the right foot.  There was no evidence of deep ulceration in between the toes in the webspace or on the digits themselves.  There was some necrotic skin that was sloughing off the dorsal aspect of the fourth toe as well as the medial aspect of the fifth toe with underlying gangrenous changes noted especially to the fourth toe.  In the webspace no deep ulceration noted.  No evidence of draining sinus tract or abscess clinically.  Please see my post debridement/cleansing pictures below.             L - Normal temp/texture/turgor with no open lesion or clinical signs of infection /  Wound:   MSK:  R - Edema of foot/ ankle  L -  No gross deformities. Compartments soft, non-tender, compressible  Neuro: R - Gross sensation diminished to digit level. Gross motor function intact   L - Gross sensation diminished to digit level. Gross motor function intact    ASSESSMENT/PLAN OF CARE 73 y.o. male with PMHx significant for  DM2, HTN, CKD  with evidence of gangrenous changes to the 3rd, 4th and 5th toes on the right foot  WBC 16.4 A1c - pending  MRI R foot: pending  ABI - ordered  - No obvious evidence of abscess or osteomyelitis clinically, believe the changes to his right foot to represent early necrotic changes brought on by PAD.  -Await MRI findings to determine if any procedure is indicated.  I thoroughly cleansed in between the digits with Betadine this afternoon and the area does appear relatively healthy aside from the necrotic changes. -ABI arterial screening exam has been ordered, follow-up results and if abnormal will require vascular surg consultation - Continue IV abx per infectious disease recommendations - Anticoagulation: Okay to continue per primary - Wound care:  Applied Betadine gauze in between the affected digits, change daily - WB status: Weightbearing as tolerated right foot - Will continue to follow   Thank you for the consult.  Please contact me directly with any questions or concerns.           Scott Hale, DPM [facility] / [facility]    [address] [place], [place] [address]                Office [phone]  Fax [phone]

## 2023-06-10 NOTE — Progress Notes (Signed)
 PT Cancellation Note  Patient Details Name: Scott Hale MRN: [id number] DOB: November 11, 1950   Cancelled Treatment:    Reason Eval/Treat Not Completed: Other (comment). Pt out of room at this time, PT to re-attempt as able.    Darien Eden PT, DPT 2:14 PM,06/10/23

## 2023-06-10 NOTE — Plan of Care (Signed)

## 2023-06-10 NOTE — Inpatient Diabetes Management (Signed)
 Inpatient Diabetes Program Recommendations  AACE/ADA: New Consensus Statement on Inpatient Glycemic Control  Target Ranges:  Prepandial:   less than 140 mg/dL      Peak postprandial:   less than 180 mg/dL (1-2 hours)      Critically ill patients:  140 - 180 mg/dL    Latest Reference Range & Units 06/09/23 07:45 06/09/23 11:58 06/09/23 16:43 06/09/23 21:07 06/10/23 08:58 06/10/23 11:30  Glucose-Capillary 70 - 99 mg/dL 147 (H) 829 (H) 562 (H) 192 (H) 145 (H) 79    Latest Reference Range & Units 06/10/23 05:30  Glucose 70 - 99 mg/dL 64 (L)    Review of Glycemic Control  Diabetes history: DM2 Outpatient Diabetes medications: Lantus  40-80 units at bedtime, Farxiga  10 mg QAM, Trulicity 1/5 mg Qweek Current orders for Inpatient glycemic control: Semglee  60 units at bedtime, Novolog 0-15 units TID with meals, Novolog 0-5 units QHS  Inpatient Diabetes Program Recommendations:    Insulin : Lab glucose 64 mg/dl at 1:30 am and CBG 79 mg/dl at 86:57 am today.  Please consider decreasing Semglee  to 57 units at bedtime and Novolog correction to 0-9 units TID with meals.  Thanks, Beacher Limerick, RN, MSN, CDCES Diabetes Coordinator Inpatient Diabetes Program [phone] (Team Pager from 8am to 5pm)

## 2023-06-10 NOTE — Plan of Care (Signed)
  Problem: Education: Goal: Ability to describe self-care measures that may prevent or decrease complications (Diabetes Survival Skills Education) will improve Outcome: Progressing   Problem: Coping: Goal: Ability to adjust to condition or change in health will improve Outcome: Progressing   Problem: Fluid Volume: Goal: Ability to maintain a balanced intake and output will improve Outcome: Progressing   Problem: Health Behavior/Discharge Planning: Goal: Ability to identify and utilize available resources and services will improve Outcome: Progressing Goal: Ability to manage health-related needs will improve Outcome: Progressing   Problem: Metabolic: Goal: Ability to maintain appropriate glucose levels will improve Outcome: Progressing   Problem: Nutritional: Goal: Maintenance of adequate nutrition will improve Outcome: Progressing Goal: Progress toward achieving an optimal weight will improve Outcome: Progressing   Problem: Skin Integrity: Goal: Risk for impaired skin integrity will decrease Outcome: Progressing   Problem: Tissue Perfusion: Goal: Adequacy of tissue perfusion will improve Outcome: Progressing   Problem: Clinical Measurements: Goal: Ability to avoid or minimize complications of infection will improve Outcome: Progressing   Problem: Skin Integrity: Goal: Skin integrity will improve Outcome: Progressing   Problem: Education: Goal: Knowledge of General Education information will improve Description: Including pain rating scale, medication(s)/side effects and non-pharmacologic comfort measures Outcome: Progressing   Problem: Health Behavior/Discharge Planning: Goal: Ability to manage health-related needs will improve Outcome: Progressing   Problem: Clinical Measurements: Goal: Ability to maintain clinical measurements within normal limits will improve Outcome: Progressing Goal: Will remain free from infection Outcome: Progressing Goal: Diagnostic  test results will improve Outcome: Progressing Goal: Respiratory complications will improve Outcome: Progressing Goal: Cardiovascular complication will be avoided Outcome: Progressing   Problem: Activity: Goal: Risk for activity intolerance will decrease Outcome: Progressing   Problem: Nutrition: Goal: Adequate nutrition will be maintained Outcome: Progressing   Problem: Coping: Goal: Level of anxiety will decrease Outcome: Progressing   Problem: Elimination: Goal: Will not experience complications related to bowel motility Outcome: Progressing Goal: Will not experience complications related to urinary retention Outcome: Progressing   Problem: Pain Managment: Goal: General experience of comfort will improve and/or be controlled Outcome: Progressing   Problem: Safety: Goal: Ability to remain free from injury will improve Outcome: Progressing   Problem: Skin Integrity: Goal: Risk for impaired skin integrity will decrease Outcome: Progressing

## 2023-06-11 DIAGNOSIS — M7989 Other specified soft tissue disorders: Secondary | ICD-10-CM | POA: Diagnosis not present

## 2023-06-11 DIAGNOSIS — L03115 Cellulitis of right lower limb: Principal | ICD-10-CM

## 2023-06-11 DIAGNOSIS — L539 Erythematous condition, unspecified: Secondary | ICD-10-CM | POA: Diagnosis not present

## 2023-06-11 DIAGNOSIS — L089 Local infection of the skin and subcutaneous tissue, unspecified: Secondary | ICD-10-CM

## 2023-06-11 DIAGNOSIS — E11628 Type 2 diabetes mellitus with other skin complications: Secondary | ICD-10-CM | POA: Diagnosis not present

## 2023-06-11 DIAGNOSIS — I739 Peripheral vascular disease, unspecified: Secondary | ICD-10-CM | POA: Diagnosis not present

## 2023-06-11 DIAGNOSIS — I96 Gangrene, not elsewhere classified: Secondary | ICD-10-CM | POA: Diagnosis not present

## 2023-06-11 LAB — BASIC METABOLIC PANEL WITH GFR
Anion gap: 10 (ref 5–15)
BUN: 10 mg/dL (ref 8–23)
CO2: 25 mmol/L (ref 22–32)
Calcium: 8.8 mg/dL — ABNORMAL LOW (ref 8.9–10.3)
Chloride: 96 mmol/L — ABNORMAL LOW (ref 98–111)
Creatinine, Ser: 1.27 mg/dL — ABNORMAL HIGH (ref 0.61–1.24)
GFR, Estimated: >60 mL/min (ref >60)
Glucose, Bld: 62 mg/dL — ABNORMAL LOW (ref 70–99)
Potassium: 4.2 mmol/L (ref 3.5–5.1)
Sodium: 131 mmol/L — ABNORMAL LOW (ref 135–145)

## 2023-06-11 LAB — CBC
HCT: 34.1 % — ABNORMAL LOW (ref 39.0–52.0)
Hemoglobin: 11.6 g/dL — ABNORMAL LOW (ref 13.0–17.0)
MCH: 30.4 pg (ref 26.0–34.0)
MCHC: 34 g/dL (ref 30.0–36.0)
MCV: 89.3 fL (ref 80.0–100.0)
Platelets: 281 10*3/uL (ref 150–400)
RBC: 3.82 MIL/uL — ABNORMAL LOW (ref 4.22–5.81)
RDW: 13.6 % (ref 11.5–15.5)
WBC: 18.1 10*3/uL — ABNORMAL HIGH (ref 4.0–10.5)
nRBC: 0 % (ref 0.0–0.2)

## 2023-06-11 LAB — GLUCOSE, CAPILLARY
Glucose-Capillary: 80 mg/dL (ref 70–99)
Glucose-Capillary: 192 mg/dL — ABNORMAL HIGH (ref 70–99)
Glucose-Capillary: 200 mg/dL — ABNORMAL HIGH (ref 70–99)
Glucose-Capillary: 148 mg/dL — ABNORMAL HIGH (ref 70–99)

## 2023-06-11 MED ORDER — CARVEDILOL 3.125 MG PO TABS
3.1250 mg | ORAL_TABLET | Freq: Two times a day (BID) | ORAL | Status: DC
  Administered 2023-06-11 – 2023-06-19 (×12): 3.125 mg via ORAL
  Filled 2023-06-11 (×14): qty 1

## 2023-06-11 MED ORDER — INSULIN GLARGINE-YFGN 100 UNIT/ML ~~LOC~~ SOLN
45.0000 [IU] | Freq: Every day | SUBCUTANEOUS | Status: DC
  Administered 2023-06-11 – 2023-06-18 (×7): 45 [IU] via SUBCUTANEOUS
  Filled 2023-06-11 (×10): qty 0.45

## 2023-06-11 MED ORDER — ACETAMINOPHEN 500 MG PO TABS
1000.0000 mg | ORAL_TABLET | Freq: Three times a day (TID) | ORAL | Status: DC | PRN
  Administered 2023-06-11 – 2023-06-18 (×8): 1000 mg via ORAL
  Filled 2023-06-11 (×9): qty 2

## 2023-06-11 MED ORDER — POLYETHYLENE GLYCOL 3350 17 G PO PACK
17.0000 g | PACK | ORAL | Status: AC
  Administered 2023-06-11 (×4): 17 g via ORAL
  Filled 2023-06-11 (×4): qty 1

## 2023-06-11 NOTE — Progress Notes (Signed)
 PODIATRY PROGRESS NOTE Patient Name: Scott Hale  DOB 23-Jul-1950 DOA 06/07/2023  Hospital Day: 5  Assessment:  73 y.o. male with PMHx significant for  DM2, HTN, CKD  with evidence of gangrenous changes to the 3rd, 4th and 5th toes on the right foot   WBC 18.1 A1c -  7.5 ESR/CRP : ordered   MRI R foot:  Marrow sig abnormalisty base of 3rd and 4th prox phalanges and heads of third and 4th met compatible with OM/ septic arthritis. Also possible 5th met head OM.    ABI -  0.58 R, 0.86 L - Findings indicative of moderate right and mild left lower extremity peripheral arterial disease.    Plan:  - MRI with possible OM and septic arthritis of 3rd and 4th MPJs and 5th met head OM. Difficult to rule this out given his leukocytosis, though clinically does not necessarily fit that picture. Will add esr / crp and discuss further with ID. Any podiatric surgery would happen following vascular interventions.  - Appreciate vascular surgery, plan for BLE angiography later this week. - Continue IV abx per infectious disease recommendations - Anticoagulation: Okay to continue per primary/ vascular recs - Wound care: Applied Betadine gauze in between the affected digits, change daily - WB status: Weightbearing as tolerated right foot, post op shoe ordered - Will continue to follow        Maridee Shoemaker, DPM [facility]    Subjective:  Discussed MRI findings as well as vascular plan for intervention later this week. Discussed we will make further determination on any podiatric procedure following vascular intervention and pending clinical picture of the toes at that time.   Objective:   Vitals:   06/11/23 0434 06/11/23 0820  BP: (!) 153/76 (!) 142/79  Pulse: 71 72  Resp: 17 16  Temp: 99.4 F (37.4 C) 98 F (36.7 C)  SpO2: 100% 100%       Latest Ref Rng & Units 06/11/2023    3:15 AM 06/10/2023    5:30 AM 06/09/2023    4:54 AM  CBC  WBC 4.0 - 10.5 K/uL 18.1  16.4  15.8    Hemoglobin 13.0 - 17.0 g/dL 16.1  09.6  04.5   Hematocrit 39.0 - 52.0 % 34.1  32.9  34.5   Platelets 150 - 400 K/uL 281  283  253        Latest Ref Rng & Units 06/11/2023    3:15 AM 06/10/2023    5:30 AM 06/09/2023    4:54 AM  BMP  Glucose 70 - 99 mg/dL 62  64  409   BUN 8 - 23 mg/dL 10  10  12    Creatinine 0.61 - 1.24 mg/dL 8.11  9.14  7.82   Sodium 135 - 145 mmol/L 131  136  131   Potassium 3.5 - 5.1 mmol/L 4.2  3.8  3.6   Chloride 98 - 111 mmol/L 96  101  99   CO2 22 - 32 mmol/L 25  26  23    Calcium 8.9 - 10.3 mg/dL 8.8  8.3  8.4     General: AAOx3, NAD  Lower Extremity Exam Vasc:     R - PT non palpable, DP non palpable.  Digits feel cool to touch               L - PT non palpable, DP non palpable.  Digits feel cool to touch   Derm:  R -cleansed in between the 2nd and 3rd 3rd and 4th and 4th and 5th toes on the right foot.  There was no evidence of deep ulceration in between the toes in the webspace or on the digits themselves.  There was some necrotic skin that was sloughing off the dorsal aspect of the fourth toe as well as the medial aspect of the fifth toe with underlying gangrenous changes noted especially to the fourth toe.  In the webspace no deep ulceration noted.  No evidence of draining sinus tract or abscess clinically                  L - Normal temp/texture/turgor with no open lesion or clinical signs of infection / Wound:    MSK:     R - Edema of foot/ ankle               L -  No gross deformities. Compartments soft, non-tender, compressible   Neuro:R - Gross sensation diminished to digit level. Gross motor function intact                        L - Gross sensation diminished to digit level. Gross motor function intact   Radiology:  Results reviewed. See assessment for pertinent imaging results

## 2023-06-11 NOTE — Progress Notes (Addendum)
  PROGRESS NOTE    Scott Hale  UXL:[id number] DOB: [birth year] DOA: 06/07/2023 PCP: Lyle San, MD  140A/140A-AA  LOS: 3 days   Brief hospital course:   Assessment & Plan: Scott Hale is a 73 y.o. male with medical history significant of DM2, HTN, CKD who presented with right foot pain.    RLE cellulitis  Possible osteomyelitis and septic arthritis --received vanc/cefe/flagyl  in the ED, then switched to Ancef .  No open wounds on presentation, but wounds opened up in between toes on 4/28.  ID and podiatry consulted.  Abx switched to abx to Unasyn and Zyvox, per ID. --MRI right foot showed multiple areas of osteomyelitis Plan: --cont Unasyn and Zyvox --wound care per podiatry order --Weightbearing as tolerated right foot, post op shoe ordered  --podiatry to consider surgery after vascular intervention.  PAD --ABI with Findings indicative of moderate right and mild left lower extremity peripheral arterial disease.  --Vascular surgery consult today --angiogram with intervention either Thursday or Friday   Severe Sepsis --fever, leukocytosis, with AKI, source cellulitis   DM2 --recent A1c 7.6.  Pt reported taking Levemir 80u nightly at home. --reduce glargine to 45u nightly --ACHS and SSI   HTN --resume coreg    AKI on CKD 3a --Cr 1.85 on presentation, improved to 1.37 with IVF. --oral hydration now  Hyponatremia --of unclear significance   DVT prophylaxis: Lovenox  SQ Code Status: Full code  Family Communication: friend/care giver updated at bedside today Level of care: Med-Surg Dispo:   The patient is from: home Anticipated d/c is to: home Anticipated d/c date is: > 3 days   Subjective and Interval History:  Pt denied pain in his RLE.  Still no BM yet.   Objective: Vitals:   06/10/23 2023 06/11/23 0434 06/11/23 0820 06/11/23 1632  BP: 138/75 (!) 153/76 (!) 142/79 (!) 160/77  Pulse: 76 71 72 72  Resp: 20 17 16 16   Temp: 100 F (37.8 C) 99.4 F (37.4  C) 98 F (36.7 C) (!) 100.8 F (38.2 C)  TempSrc: Oral Oral Oral Oral  SpO2: 99% 100% 100% 100%  Weight:      Height:        Intake/Output Summary (Last 24 hours) at 06/11/2023 1902 Last data filed at 06/11/2023 5366 Gross per 24 hour  Intake 400.08 ml  Output 1600 ml  Net -1199.92 ml   Filed Weights   06/07/23 1453  Weight: 108 kg    Examination:   Constitutional: NAD, AAOx3 HEENT: conjunctivae and lids normal, EOMI CV: No cyanosis.   RESP: normal respiratory effort, on RA Extremities: edema in RLE SKIN: warm, dry Neuro: II - XII grossly intact.   Psych: Normal mood and affect.  Appropriate judgement and reason   Data Reviewed: I have personally reviewed labs and imaging studies  Time spent: 50 minutes  Garrison Kanner, MD [facility] If 7PM-7AM, please contact night-coverage 06/11/2023, 7:02 PM

## 2023-06-11 NOTE — Care Management CC44 (Signed)
 Condition Code 44 Documentation Completed  Patient Details  Name: Scott Hale MRN: [id number] Date of Birth: 1950/06/29   Condition Code 44 given:    Patient signature on Condition Code 44 notice:    Documentation of 2 MD's agreement:    Code 44 added to claim:       Brookie Cantor, CMA 06/11/2023, 2:40 PM

## 2023-06-11 NOTE — Consult Note (Signed)
 Hospital Consult    Reason for Consult:  Bilateral lower extremity pain  Requesting Physician:  Dr Barry Boring MD  MRN #:  [id number]  History of Present Illness: This is a 73 y.o. male with a past medical history of arthritis, CHF, diabetes mellitus 2, kidney stones, hypertension and sleep apnea.  Patient presents to [facility] emergency department on 06/07/2023 with right foot pain.  Patient endorses right foot pain started about 2 weeks ago.  He was working out and came down on his foot wrong.  Since that time his foot started swelling became red and developed a streak up toward his knee.  Denies any prior injury or bug bite or any other trauma to his right lower extremity.  Does have a previous right knee arthroplasty.  Patient endorses that he has noticed over the last week he changes to his toes on his right foot from the 3rd-5th toe.  He endorses that he has been a diabetic for a while now and does have peripheral neuropathy though not severe where he does not have any sensation to his foot.  Upon workup patient had bilateral lower extremity ultrasounds with ABIs.  Right lower extremity ABI was 0.58 with monophasic waveforms while the left ABI was 0.86 with also monophasic waveforms.  This is indicative of moderate to severe lower extremity peripheral artery disease.  Vascular surgery was consulted to evaluate.  Past Medical History:  Diagnosis Date   Arthritis    CHF (congestive heart failure) (HCC)    Diabetes mellitus without complication (HCC)    Erectile dysfunction    History of kidney stones    Hypertension    Sleep apnea     Past Surgical History:  Procedure Laterality Date   CARDIAC CATHETERIZATION     COLONOSCOPY N/A 03/07/2021   Procedure: COLONOSCOPY;  Surgeon: [name], MD;  Location: [facility] ENDOSCOPY;  Service: Endoscopy;  Laterality: N/A;  IDDM   COLONOSCOPY WITH PROPOFOL  N/A 04/09/2017   Procedure: COLONOSCOPY WITH PROPOFOL ;  Surgeon: [name], MD;   Location: [facility] ENDOSCOPY;  Service: Gastroenterology;  Laterality: N/A;   CORONARY ARTERY BYPASS GRAFT  2003   JOINT REPLACEMENT     total knee   RENAL ARTERY STENT     due to kidney stones    No Known Allergies  Prior to Admission medications   Medication Sig Start Date End Date Taking? Authorizing Provider  acetaminophen  (TYLENOL ) 325 MG tablet Take 650 mg by mouth every 6 (six) hours as needed for mild pain, headache, fever or moderate pain.   Yes [provider]  ASPIRIN 81 PO Take by mouth daily.   Yes [provider]  atorvastatin (LIPITOR) 40 MG tablet TAKE 1 TABLET BY MOUTH EVERY DAY *NEW PRESCRIPTION REQUEST* 05/21/23  Yes Shawnee Dellen A, FNP  carvedilol  (COREG ) 3.125 MG tablet TAKE ONE (1) TABLET BY MOUTH TWICE DAILY *NEW PRESCRIPTION REQUEST* 05/21/23  Yes Shawnee Dellen A, FNP  cholecalciferol (VITAMIN D) 25 MCG (1000 UT) tablet Take 1,000 Units by mouth daily.   Yes [provider]  dapagliflozin  propanediol (FARXIGA ) 10 MG TABS tablet TAKE 1 TABLET BY MOUTH EVERY DAY BEFORE BREAKFAST *NEW PRESCRIPTION REQUEST* 05/21/23  Yes Shawnee Dellen A, FNP  Dulaglutide 1.5 MG/0.5ML SOPN Inject 1.5 mg into the skin once a week. Mondays 09/17/19  Yes [provider]  furosemide  (LASIX ) 40 MG tablet TAKE 1 TABLET BY MOUTH EVERY DAY *NEW PRESCRIPTION REQUEST* 05/21/23  Yes Shawnee Dellen A, FNP  glipiZIDE (GLUCOTROL) 10  MG tablet Take 10 mg by mouth daily before breakfast.   Yes [provider]  insulin  glargine (LANTUS ) 100 UNIT/ML Solostar Pen Inject 40-80 Units into the skin at bedtime.   Yes [provider]  Niacin, Antihyperlipidemic, 500 MG TABS Take 500 mg by mouth daily with breakfast.   Yes [provider]  sacubitril -valsartan  (ENTRESTO ) 97-103 MG Take 1 tablet by mouth 2 (two) times daily. 05/27/23  Yes Shawnee Dellen A, FNP  spironolactone  (ALDACTONE ) 25 MG tablet TAKE 1 TABLET BY MOUTH EVERY DAY *NEW PRESCRIPTION REQUEST* 05/21/23   Yes Shawnee Dellen A, FNP  tadalafil (CIALIS) 5 MG tablet Take by mouth daily as needed for erectile dysfunction.   Yes [provider]  vitamin B-12 (CYANOCOBALAMIN) 500 MCG tablet Take 500 mcg by mouth daily.   Yes [provider]    Social History   Socioeconomic History   Marital status: Single    Spouse name: Not on file   Number of children: 5   Years of education: 34   Highest education level: 12th grade  Occupational History   Occupation: retired  Tobacco Use   Smoking status: Never   Smokeless tobacco: Never  Vaping Use   Vaping status: Never Used  Substance and Sexual Activity   Alcohol use: No   Drug use: No   Sexual activity: Yes    Birth control/protection: Condom  Other Topics Concern   Not on file  Social History Narrative   Not on file   Social Drivers of Health   Financial Resource Strain: Medium Risk (12/05/2021)   Received from [facility], [facility]   Overall Financial Resource Strain (CARDIA)    Difficulty of Paying Living Expenses: Somewhat hard  Food Insecurity: Food Insecurity Present (06/08/2023)   Hunger Vital Sign    Worried About Running Out of Food in the Last Year: Sometimes true    Ran Out of Food in the Last Year: Never true  Transportation Needs: No Transportation Needs (06/08/2023)   PRAPARE - Administrator, Civil Service (Medical): No    Lack of Transportation (Non-Medical): No  Physical Activity: Sufficiently Active (03/26/2017)   Exercise Vital Sign    Days of Exercise per Week: 7 days    Minutes of Exercise per Session: 60 min  Stress: No Stress Concern Present (03/26/2017)   [employer] of Occupational Health - Occupational Stress Questionnaire    Feeling of Stress : Only a little  Social Connections: Moderately Isolated (06/08/2023)   Social Connection and Isolation Panel [NHANES]    Frequency of Communication with Friends and Family: More than three times a  week    Frequency of Social Gatherings with Friends and Family: Once a week    Attends Religious Services: More than 4 times per year    Active Member of [employer] or Organizations: No    Attends Banker Meetings: Never    Marital Status: Never married  Intimate Partner Violence: Not At Risk (06/08/2023)   Humiliation, Afraid, Rape, and Kick questionnaire    Fear of Current or Ex-Partner: No    Emotionally Abused: No    Physically Abused: No    Sexually Abused: No     Family History  Problem Relation Age of Onset   Diabetes Mother    Alcoholism Father    Diabetes Sister    Diabetes Sister    Arrhythmia Sister    Diabetes Sister     ROS: Otherwise  negative unless mentioned in HPI  Physical Examination  Vitals:   06/11/23 0434 06/11/23 0820  BP: (!) 153/76 (!) 142/79  Pulse: 71 72  Resp: 17 16  Temp: 99.4 F (37.4 C) 98 F (36.7 C)  SpO2: 100% 100%   Body mass index is 32.28 kg/m.  General:  WDWN in NAD Gait: Not observed HENT: WNL, normocephalic Pulmonary: normal non-labored breathing, without Rales, rhonchi,  wheezing Cardiac: regular, without  Murmurs, rubs or gallops; without carotid bruits Abdomen: Positive bowel sounds throughout, soft, NT/ND, no masses Skin: without rashes Vascular Exam/Pulses: Unable to palpate pulses in bilateral lower extremities. Extremities: with ischemic changes, with Gangrene , with cellulitis; with open wounds;  Musculoskeletal: no muscle wasting or atrophy  Neurologic: A&O X 3;  No focal weakness or paresthesias are detected; speech is fluent/normal Psychiatric:  The pt has Normal affect. Lymph:  Unremarkable  CBC    Component Value Date/Time   WBC 18.1 (H) 06/11/2023 0315   RBC 3.82 (L) 06/11/2023 0315   HGB 11.6 (L) 06/11/2023 0315   HCT 34.1 (L) 06/11/2023 0315   PLT 281 06/11/2023 0315   MCV 89.3 06/11/2023 0315   MCH 30.4 06/11/2023 0315   MCHC 34.0 06/11/2023 0315   RDW 13.6 06/11/2023 0315   LYMPHSABS  1.4 06/07/2023 1653   MONOABS 1.5 (H) 06/07/2023 1653   EOSABS 0.1 06/07/2023 1653   BASOSABS 0.0 06/07/2023 1653    BMET    Component Value Date/Time   NA 131 (L) 06/11/2023 0315   K 4.2 06/11/2023 0315   CL 96 (L) 06/11/2023 0315   CO2 25 06/11/2023 0315   GLUCOSE 62 (L) 06/11/2023 0315   BUN 10 06/11/2023 0315   CREATININE 1.27 (H) 06/11/2023 0315   CALCIUM 8.8 (L) 06/11/2023 0315   GFRNONAA >60 06/11/2023 0315   GFRAA >60 12/15/2018 1326    COAGS: No results found for: "INR", "PROTIME"   Non-Invasive Vascular Imaging:   EXAM:06/10/23 NONINVASIVE PHYSIOLOGIC VASCULAR STUDY OF BILATERAL LOWER EXTREMITIES   TECHNIQUE: Evaluation of both lower extremities were performed at rest, including calculation of ankle-brachial indices with single level pressure measurements and doppler recording.   COMPARISON:  None available.   FINDINGS: Right ABI:  0.58   Left ABI:  0.86   Right Lower Extremity: Posterior tibial and dorsalis pedis artery waveforms are monophasic.   Left Lower Extremity: Posterior tibial and dorsalis pedis artery waveforms are monophasic.   0.5-0.79 Moderate PAD   IMPRESSION: Findings indicative of moderate right and mild left lower extremity peripheral arterial disease.  Statin:  Yes.   Beta Blocker:  Yes.   Aspirin:  Yes.   ACEI:  No. ARB:  Yes.   CCB use:  No Other antiplatelets/anticoagulants:  No.    ASSESSMENT/PLAN: This is a 73 y.o. male who presents to [facility] emergency department with right foot pain and necrosis to his second 3rd and 4th and 5th toes on his right foot.  Patient endorses it started to get bad about a week ago.  Right lower extremity from mid calf to foot is swollen and cellulitic.  Patient underwent bilateral lower extremity vascular ultrasounds with ABIs which were poor suggesting patient has bilateral lower atherosclerotic disease.  Is noted to have monophasic waveforms.  Therefore vascular surgery recommends  patient undergo bilateral lower extremity angiograms with possible intervention.  We will plan on taking the patient to the vascular lab this week.  Had a long detailed discussion with the patient and his wife at the  bedside this morning concerning the procedure, benefits, risk, and complications.  Both verbalized her understanding and wished to proceed as soon as possible.  I answered all the questions this morning.  Patient will be made n.p.o. after midnight on the day of the procedure.  Patient currently on aspirin 81 mg daily and Lovenox  injections 50 mg subcu every 24 hours.  Patient also noted to be on Unasyn 3 grams every 6 hours   -Discussed the case in detail with Dr. Mikki Alexander MD and he agrees with the plan   Annamaria Barrette [facility] 06/11/2023 12:05 PM

## 2023-06-11 NOTE — Progress Notes (Signed)
 Date of Admission:  06/07/2023     ID: Scott Hale is a 73 y.o. male  Principal Problem:   Cellulitis Active Problems:   Gangrene of toe of right foot (HCC)   PAD (peripheral artery disease) (HCC)   Cellulitis of right foot    Subjective: Pt c/l swelling and pain rt foot  Medications:   aspirin  81 mg Oral Daily   enoxaparin  (LOVENOX ) injection  50 mg Subcutaneous Q24H   insulin  aspart  0-5 Units Subcutaneous QHS   insulin  aspart  0-9 Units Subcutaneous TID WC   insulin  glargine-yfgn  45 Units Subcutaneous QHS   polyethylene glycol  17 g Oral Q2H    Objective: Vital signs in last 24 hours: Patient Vitals for the past 24 hrs:  BP Temp Temp src Pulse Resp SpO2  06/11/23 0820 (!) 142/79 98 F (36.7 C) Oral 72 16 100 %  06/11/23 0434 (!) 153/76 99.4 F (37.4 C) Oral 71 17 100 %  06/10/23 2023 138/75 100 F (37.8 C) Oral 76 20 99 %       PHYSICAL EXAM:  General: Alert, cooperative, no distress, appears stated age.  Lungs: Clear to auscultation bilaterally. No Wheezing or Rhonchi. No rales. Heart: Regular rate and rhythm, no murmur, rub or gallop. Abdomen: Soft, non-tender,not distended. Bowel sounds normal. No masses Extremities:         Skin: No rashes or lesions. Or bruising Lymph: Cervical, supraclavicular normal. Neurologic: Grossly non-focal  Lab Results    Latest Ref Rng & Units 06/11/2023    3:15 AM 06/10/2023    5:30 AM 06/09/2023    4:54 AM  CBC  WBC 4.0 - 10.5 K/uL 18.1  16.4  15.8   Hemoglobin 13.0 - 17.0 g/dL 16.1  09.6  04.5   Hematocrit 39.0 - 52.0 % 34.1  32.9  34.5   Platelets 150 - 400 K/uL 281  283  253        Latest Ref Rng & Units 06/11/2023    3:15 AM 06/10/2023    5:30 AM 06/09/2023    4:54 AM  CMP  Glucose 70 - 99 mg/dL 62  64  409   BUN 8 - 23 mg/dL 10  10  12    Creatinine 0.61 - 1.24 mg/dL 8.11  9.14  7.82   Sodium 135 - 145 mmol/L 131  136  131   Potassium 3.5 - 5.1 mmol/L 4.2  3.8  3.6   Chloride 98 - 111 mmol/L 96  101   99   CO2 22 - 32 mmol/L 25  26  23    Calcium 8.9 - 10.3 mg/dL 8.8  8.3  8.4         Studies/Results: MR FOOT RIGHT W WO CONTRAST Result Date: 06/10/2023 CLINICAL DATA:  Right foot wound with concern for abscess and osteomyelitis. EXAM: MRI OF THE RIGHT FOREFOOT WITHOUT AND WITH CONTRAST TECHNIQUE: Multiplanar, multisequence MR imaging of the right foot was performed before and after the administration of intravenous contrast. CONTRAST:  10mL GADAVIST GADOBUTROL 1 MMOL/ML IV SOLN COMPARISON:  Right foot radiographs dated 06/07/2023. FINDINGS: Bones/Joint/Cartilage/Soft tissue Diffuse subcutaneous edema of the midfoot and forefoot with enhancement dorsally. Cutaneous irregularity with soft tissue defect/wound at the dorsal third intermetatarsal space, overlying the third and fourth proximal phalanges. This wound demonstrates peripheral T2 hyperintense signal with heterogenous intermediate to low signal centrally without significant associated enhancement, suggestive of wound with devitalized/necrotic tissue, and extends deep through the third intermetatarsal space (series  6, image 19) along the plantar subcutaneous tissues medially to the level of the base of second proximal phalanx and laterally to the level of the fifth MTP joint. There is marrow edema with associated T1 hypointensity of the base of the third and fourth proximal phalanges in the heads of the third and fourth metatarsals at the level of the third and fourth MTP joints, compatible with osteomyelitis/septic arthritis. Marrow signal abnormality with enhancement at the head of fifth metatarsal, concerning for osteomyelitis. T2 hyperintense marrow edema with mild enhancement at the base of the second proximal phalanx could reflect reactive marrow changes, however, osteomyelitis can not be excluded. No acute fracture or dislocation. Mild degenerative changes of the first MTP joint. No significant joint effusion. Diffuse interphalangeal joint  space narrowing. Ligaments Collateral ligaments are intact. Muscles and Tendons Flexor and extensor tendons are intact. No significant tenosynovitis. Diffuse edema with enhancement of the intrinsic musculature of the foot, concerning for myositis. IMPRESSION: 1. Soft tissue defect/wound at the dorsal third intermetatarsal space, overlying the third and fourth proximal phalanges, extends deep through the third intermetatarsal space and along the plantar subcutaneous tissues medially to the level of the base of second proximal phalanx and laterally to the level of the fifth MTP joint. This wound demonstrates peripheral T2 hyperintense signal with heterogenous intermediate to low signal centrally without significant associated enhancement, suggestive of wound with devitalized/necrotic tissue. There is pronounced surrounding soft tissue edema and enhancement, compatible with cellulitis/myositis. 2. Marrow signal abnormality with enhancement of the base of the third and fourth proximal phalanges and the heads of the third and fourth metatarsals at the level of the third and fourth MTP joints, compatible with osteomyelitis/septic arthritis. Marrow signal abnormality with enhancement at the head of fifth metatarsal, concerning for osteomyelitis. 3. Marrow edema with mild enhancement at the base of the second proximal phalanx could reflect reactive marrow changes, however, osteomyelitis can not be excluded. Electronically Signed   By: Mannie Seek M.D.   On: 06/10/2023 17:53   US  ARTERIAL ABI (SCREENING LOWER EXTREMITY) Result Date: 06/10/2023 CLINICAL DATA:  Right foot ulcer. Hypertension. Hyperlipidemia. Diabetes. EXAM: NONINVASIVE PHYSIOLOGIC VASCULAR STUDY OF BILATERAL LOWER EXTREMITIES TECHNIQUE: Evaluation of both lower extremities were performed at rest, including calculation of ankle-brachial indices with single level pressure measurements and doppler recording. COMPARISON:  None available. FINDINGS: Right  ABI:  0.58 Left ABI:  0.86 Right Lower Extremity: Posterior tibial and dorsalis pedis artery waveforms are monophasic. Left Lower Extremity: Posterior tibial and dorsalis pedis artery waveforms are monophasic. 0.5-0.79 Moderate PAD IMPRESSION: Findings indicative of moderate right and mild left lower extremity peripheral arterial disease. Electronically Signed   By: Elester Grim M.D.   On: 06/10/2023 17:24     Assessment/Plan: 73 yr male with DM presents with pain rt foot of 2 weeks duration   Rt foot swelling and erythema and tenderness with interdigital cleft maceration between 3/4 th toe with discharge 4th toe discolored  soft tissue infection of the rt foot- MRI shows non viable [place] tissue extending from the interdital cleft 3/4 along the dorsum Pt on unasyn and linezolid No fever Wbc going up- this could be due to ischemia. Non viable tissue Very likely will need debridement especially if the leucocytosis keeps increasing MRI questioned osteo   ABI moderately reduced rt side Vascular will be taking him for angio   DM- no recent A1c   Htn   CAD s/p CABG   RT TKA) no evidence of PJI ? Discussed  the management with the patient

## 2023-06-11 NOTE — Evaluation (Signed)
 Physical Therapy Evaluation Patient Details Name: Scott Hale MRN: [id number] DOB: Jan 05, 1951 Today's Date: 06/11/2023  History of Present Illness  Scott Hale is a 72yoM who comes to [facility] after progression in Rt foot pain and onset redness, podiatry consulted who voice concerns of possible ischaemic tissue necrosis. Orders placed for ABI and MRI of foot, pt empirically placed on ABX.  Clinical Impression  Pt reports improved pain in RLE. Swelling of distal RLE remains evident. PT able to demonstrate modI mobility in general, takes advantage of RW for limb off loading/pain management in gait. Pt up to chair at end of session- recommend RW and BSC to facilitate mobility needs at DC.       If plan is discharge home, recommend the following: A lot of help with walking and/or transfers;Assist for transportation;Assistance with cooking/housework   Can travel by private vehicle        Equipment Recommendations Rolling walker (2 wheels);BSC/3in1  Recommendations for Other Services       Functional Status Assessment Patient has had a recent decline in their functional status and demonstrates the ability to make significant improvements in function in a reasonable and predictable amount of time.     Precautions / Restrictions Precautions Precautions: Fall Recall of Precautions/Restrictions: Intact Restrictions Weight Bearing Restrictions Per Provider Order: Yes RLE Weight Bearing Per Provider Order: Weight bearing as tolerated      Mobility  Bed Mobility Overal bed mobility: Needs Assistance Bed Mobility: Supine to Sit, Sit to Supine     Supine to sit: Supervision     General bed mobility comments: concerted effort required    Transfers Overall transfer level: Needs assistance Equipment used: Rolling walker (2 wheels) Transfers: Sit to/from Stand Sit to Stand: Contact guard assist           General transfer comment: heavy effort, does better from elevated surface     Ambulation/Gait   Gait Distance (Feet): 80 Feet Assistive device: Rolling walker (2 wheels) Gait Pattern/deviations: WFL(Within Functional Limits), Step-through pattern       General Gait Details: able to show 80ft of WBAT AMB and final 64ft NWB hopping with RW as cued.  Stairs            Wheelchair Mobility     Tilt Bed    Modified Rankin (Stroke Patients Only)       Balance                                             Pertinent Vitals/Pain Pain Assessment Pain Assessment: No/denies pain    Home Living Family/patient expects to be discharged to:: Private residence [facility]) Living Arrangements: Non-relatives/Friends Neurosurgeon 24/7)   Type of Home: House Home Access: Stairs to enter       Home Layout: Able to live on main level with bedroom/bathroom        Prior Function Prior Level of Function : Driving;History of Falls (last six months)             Mobility Comments: was going to gym weekly ADLs Comments: independent     Extremity/Trunk Assessment                Communication        Cognition  Cueing       General Comments      Exercises     Assessment/Plan    PT Assessment Patient needs continued PT services  PT Problem List Decreased strength;Decreased activity tolerance;Decreased balance;Decreased mobility       PT Treatment Interventions DME instruction;Gait training;Stair training;Functional mobility training;Therapeutic activities;Therapeutic exercise;Balance training;Neuromuscular re-education;Patient/family education    PT Goals (Current goals can be found in the Care Plan section)  Acute Rehab PT Goals Patient Stated Goal: improved pain contro, improved AMB weight bearing tolerance PT Goal Formulation: With patient Time For Goal Achievement: 06/25/23 Potential to Achieve Goals: Fair    Frequency Min 2X/week      Co-evaluation               AM-PAC PT "6 Clicks" Mobility  Outcome Measure Help needed turning from your back to your side while in a flat bed without using bedrails?: A Little Help needed moving from lying on your back to sitting on the side of a flat bed without using bedrails?: A Little Help needed moving to and from a bed to a chair (including a wheelchair)?: A Lot Help needed standing up from a chair using your arms (e.g., wheelchair or bedside chair)?: A Lot Help needed to walk in hospital room?: A Little Help needed climbing 3-5 steps with a railing? : A Lot 6 Click Score: 15    End of Session   Activity Tolerance: Patient tolerated treatment well;Patient limited by fatigue;Patient limited by pain Patient left: in chair;with call bell/phone within reach;with family/visitor present   PT Visit Diagnosis: Difficulty in walking, not elsewhere classified (R26.2);Unsteadiness on feet (R26.81);Other abnormalities of gait and mobility (R26.89)    Time: 0981-1914 PT Time Calculation (min) (ACUTE ONLY): 32 min   Charges:   PT Evaluation $PT Eval Moderate Complexity: 1 Mod   PT General Charges $$ ACUTE PT VISIT: 1 Visit    4:22 PM, 06/11/23 Dawn Eth, PT, DPT Physical Therapist - [facility] ([facility])  [phone] (ASCOM)      Cassidey Barrales C 06/11/2023, 4:20 PM

## 2023-06-11 NOTE — Plan of Care (Signed)
  Problem: Education: Goal: Ability to describe self-care measures that may prevent or decrease complications (Diabetes Survival Skills Education) will improve Outcome: Progressing   Problem: Coping: Goal: Ability to adjust to condition or change in health will improve Outcome: Progressing   Problem: Fluid Volume: Goal: Ability to maintain a balanced intake and output will improve Outcome: Progressing   Problem: Health Behavior/Discharge Planning: Goal: Ability to identify and utilize available resources and services will improve Outcome: Progressing Goal: Ability to manage health-related needs will improve Outcome: Progressing   Problem: Metabolic: Goal: Ability to maintain appropriate glucose levels will improve Outcome: Progressing   Problem: Nutritional: Goal: Maintenance of adequate nutrition will improve Outcome: Progressing Goal: Progress toward achieving an optimal weight will improve Outcome: Progressing   Problem: Skin Integrity: Goal: Risk for impaired skin integrity will decrease Outcome: Progressing   Problem: Tissue Perfusion: Goal: Adequacy of tissue perfusion will improve Outcome: Progressing   Problem: Clinical Measurements: Goal: Ability to avoid or minimize complications of infection will improve Outcome: Progressing   Problem: Skin Integrity: Goal: Skin integrity will improve Outcome: Progressing   Problem: Education: Goal: Knowledge of General Education information will improve Description: Including pain rating scale, medication(s)/side effects and non-pharmacologic comfort measures Outcome: Progressing   Problem: Health Behavior/Discharge Planning: Goal: Ability to manage health-related needs will improve Outcome: Progressing   Problem: Clinical Measurements: Goal: Ability to maintain clinical measurements within normal limits will improve Outcome: Progressing Goal: Will remain free from infection Outcome: Progressing Goal: Diagnostic  test results will improve Outcome: Progressing Goal: Respiratory complications will improve Outcome: Progressing Goal: Cardiovascular complication will be avoided Outcome: Progressing   Problem: Activity: Goal: Risk for activity intolerance will decrease Outcome: Progressing   Problem: Nutrition: Goal: Adequate nutrition will be maintained Outcome: Progressing   Problem: Coping: Goal: Level of anxiety will decrease Outcome: Progressing   Problem: Elimination: Goal: Will not experience complications related to bowel motility Outcome: Progressing Goal: Will not experience complications related to urinary retention Outcome: Progressing   Problem: Pain Managment: Goal: General experience of comfort will improve and/or be controlled Outcome: Progressing   Problem: Safety: Goal: Ability to remain free from injury will improve Outcome: Progressing   Problem: Skin Integrity: Goal: Risk for impaired skin integrity will decrease Outcome: Progressing

## 2023-06-12 DIAGNOSIS — E11628 Type 2 diabetes mellitus with other skin complications: Secondary | ICD-10-CM | POA: Diagnosis not present

## 2023-06-12 DIAGNOSIS — I96 Gangrene, not elsewhere classified: Secondary | ICD-10-CM | POA: Diagnosis not present

## 2023-06-12 DIAGNOSIS — L539 Erythematous condition, unspecified: Secondary | ICD-10-CM | POA: Diagnosis not present

## 2023-06-12 DIAGNOSIS — M7989 Other specified soft tissue disorders: Secondary | ICD-10-CM | POA: Diagnosis not present

## 2023-06-12 DIAGNOSIS — L089 Local infection of the skin and subcutaneous tissue, unspecified: Secondary | ICD-10-CM | POA: Diagnosis not present

## 2023-06-12 DIAGNOSIS — L03115 Cellulitis of right lower limb: Secondary | ICD-10-CM | POA: Diagnosis not present

## 2023-06-12 DIAGNOSIS — I739 Peripheral vascular disease, unspecified: Secondary | ICD-10-CM | POA: Diagnosis not present

## 2023-06-12 LAB — CBC
HCT: 32.3 % — ABNORMAL LOW (ref 39.0–52.0)
Hemoglobin: 11 g/dL — ABNORMAL LOW (ref 13.0–17.0)
MCH: 30.3 pg (ref 26.0–34.0)
MCHC: 34.1 g/dL (ref 30.0–36.0)
MCV: 89 fL (ref 80.0–100.0)
Platelets: 331 10*3/uL (ref 150–400)
RBC: 3.63 MIL/uL — ABNORMAL LOW (ref 4.22–5.81)
RDW: 13.6 % (ref 11.5–15.5)
WBC: 18.8 10*3/uL — ABNORMAL HIGH (ref 4.0–10.5)
nRBC: 0 % (ref 0.0–0.2)

## 2023-06-12 LAB — CULTURE, BLOOD (ROUTINE X 2)
Culture: NO GROWTH
Culture: NO GROWTH

## 2023-06-12 LAB — PROTIME-INR
INR: 1.3 — ABNORMAL HIGH (ref 0.8–1.2)
Prothrombin Time: 16.1 s — ABNORMAL HIGH (ref 11.4–15.2)

## 2023-06-12 LAB — BASIC METABOLIC PANEL WITH GFR
Anion gap: 10 (ref 5–15)
BUN: 11 mg/dL (ref 8–23)
CO2: 25 mmol/L (ref 22–32)
Calcium: 8.8 mg/dL — ABNORMAL LOW (ref 8.9–10.3)
Chloride: 99 mmol/L (ref 98–111)
Creatinine, Ser: 1.18 mg/dL (ref 0.61–1.24)
GFR, Estimated: >60 mL/min (ref >60)
Glucose, Bld: 100 mg/dL — ABNORMAL HIGH (ref 70–99)
Potassium: 4.2 mmol/L (ref 3.5–5.1)
Sodium: 134 mmol/L — ABNORMAL LOW (ref 135–145)

## 2023-06-12 LAB — GLUCOSE, CAPILLARY
Glucose-Capillary: 145 mg/dL — ABNORMAL HIGH (ref 70–99)
Glucose-Capillary: 200 mg/dL — ABNORMAL HIGH (ref 70–99)
Glucose-Capillary: 138 mg/dL — ABNORMAL HIGH (ref 70–99)
Glucose-Capillary: 148 mg/dL — ABNORMAL HIGH (ref 70–99)

## 2023-06-12 LAB — APTT: aPTT: 30 s (ref 24–36)

## 2023-06-12 LAB — HEPARIN LEVEL (UNFRACTIONATED): Heparin Unfractionated: <0.1 [IU]/mL — ABNORMAL LOW (ref 0.30–0.70)

## 2023-06-12 MED ORDER — ATORVASTATIN CALCIUM 20 MG PO TABS
40.0000 mg | ORAL_TABLET | Freq: Every day | ORAL | Status: DC
  Administered 2023-06-12 – 2023-06-19 (×6): 40 mg via ORAL
  Filled 2023-06-12 (×7): qty 2

## 2023-06-12 MED ORDER — SPIRONOLACTONE 25 MG PO TABS
25.0000 mg | ORAL_TABLET | Freq: Every day | ORAL | Status: DC
  Administered 2023-06-12 – 2023-06-19 (×6): 25 mg via ORAL
  Filled 2023-06-12 (×7): qty 1

## 2023-06-12 MED ORDER — HEPARIN BOLUS VIA INFUSION
3000.0000 [IU] | Freq: Once | INTRAVENOUS | Status: AC
  Administered 2023-06-12: 3000 [IU] via INTRAVENOUS
  Filled 2023-06-12: qty 3000

## 2023-06-12 MED ORDER — SACUBITRIL-VALSARTAN 97-103 MG PO TABS
1.0000 | ORAL_TABLET | Freq: Two times a day (BID) | ORAL | Status: DC
  Administered 2023-06-12 – 2023-06-19 (×12): 1 via ORAL
  Filled 2023-06-12 (×18): qty 1

## 2023-06-12 MED ORDER — HEPARIN (PORCINE) 25000 UT/250ML-% IV SOLN
2350.0000 [IU]/h | INTRAVENOUS | Status: DC
  Administered 2023-06-12: 1700 [IU]/h via INTRAVENOUS
  Administered 2023-06-12: 2100 [IU]/h via INTRAVENOUS
  Filled 2023-06-12 (×2): qty 250

## 2023-06-12 MED ORDER — FUROSEMIDE 40 MG PO TABS
40.0000 mg | ORAL_TABLET | Freq: Every day | ORAL | Status: DC
Start: 2023-06-12 — End: 2023-06-19
  Administered 2023-06-12 – 2023-06-19 (×6): 40 mg via ORAL
  Filled 2023-06-12 (×7): qty 1

## 2023-06-12 MED ORDER — PIPERACILLIN-TAZOBACTAM 3.375 G IVPB
3.3750 g | Freq: Three times a day (TID) | INTRAVENOUS | Status: DC
  Administered 2023-06-12 – 2023-06-17 (×13): 3.375 g via INTRAVENOUS
  Filled 2023-06-12 (×14): qty 50

## 2023-06-12 MED ORDER — HEPARIN BOLUS VIA INFUSION
4000.0000 [IU] | Freq: Once | INTRAVENOUS | Status: AC
  Administered 2023-06-12: 4000 [IU] via INTRAVENOUS
  Filled 2023-06-12: qty 4000

## 2023-06-12 NOTE — Progress Notes (Signed)
 Progress Note    06/12/2023 7:43 AM * No surgery found *  Subjective:  This is a 73 y.o. male with a past medical history of arthritis, CHF, diabetes mellitus 2, kidney stones, hypertension and sleep apnea.  Patient presents to [facility] emergency department on 06/07/2023 with right foot pain.  Patient endorses right foot pain started about 2 weeks ago.  He was working out and came down on his foot wrong.  Since that time his foot started swelling became red and developed a streak up toward his knee. Patient endorses that he has noticed over the last week he changes to his toes on his right foot from the 3rd-5th toe.   Review of Systems  Constitutional:  Constitutional negative. HENT: HENT negative.  Respiratory: Respiratory negative.  Cardiovascular: Cardiovascular negative.  GI: Gastrointestinal negative.  GU: Genitourinary negative. Musculoskeletal: Positive for leg pain.  Skin: Positive for wound.  Neurological: Neurological negative. Psychiatric: Psychiatric negative.  All other systems reviewed and are negative   Vitals:   06/12/23 0504 06/12/23 0730  BP: (!) 119/54 123/72  Pulse: 72 72  Resp: 15 17  Temp: 100.2 F (37.9 C) 98.8 F (37.1 C)  SpO2: 99% 98%   Physical Exam: Cardiac:  RRR, normal S1 and S2, no rubs clicks gallops or murmurs noted. Lungs: Lungs clear on auscultation throughout, nonlabored breathing.  No rales rhonchi or wheezing noted. Incisions: None Extremities: Bilateral lower extremities with +1 edema.  Right lower extremity foot with +2 edema.  Unable to palpate pulses in bilateral lower extremities.  Both lower extremities are warm to touch. Abdomen: Positive bowel sounds throughout, soft, nontender nondistended. Neurologic: Alert and oriented x 3, answers all questions and follows commands appropriately.  CBC    Component Value Date/Time   WBC 18.8 (H) 06/12/2023 0508   RBC 3.63 (L) 06/12/2023 0508   HGB 11.0 (L) 06/12/2023 0508   HCT 32.3 (L)  06/12/2023 0508   PLT 331 06/12/2023 0508   MCV 89.0 06/12/2023 0508   MCH 30.3 06/12/2023 0508   MCHC 34.1 06/12/2023 0508   RDW 13.6 06/12/2023 0508   LYMPHSABS 1.4 06/07/2023 1653   MONOABS 1.5 (H) 06/07/2023 1653   EOSABS 0.1 06/07/2023 1653   BASOSABS 0.0 06/07/2023 1653    BMET    Component Value Date/Time   NA 134 (L) 06/12/2023 0508   K 4.2 06/12/2023 0508   CL 99 06/12/2023 0508   CO2 25 06/12/2023 0508   GLUCOSE 100 (H) 06/12/2023 0508   BUN 11 06/12/2023 0508   CREATININE 1.18 06/12/2023 0508   CALCIUM 8.8 (L) 06/12/2023 0508   GFRNONAA >60 06/12/2023 0508   GFRAA >60 12/15/2018 1326    INR No results found for: "INR"   Intake/Output Summary (Last 24 hours) at 06/12/2023 0743 Last data filed at 06/11/2023 2258 Gross per 24 hour  Intake --  Output 500 ml  Net -500 ml     Assessment/Plan:  73 y.o. male who presents to [facility] emergency department with right foot pain and noted open sores to right foot foot between his 2nd, 3rd and 4th toes.* No surgery found *   Plan Vascular surgery plans on taking the patient to the vascular lab tomorrow on 06/13/2023 for right lower extremity angiogram with possible intervention.  I had a long detailed discussion with the patient and his wife at the bedside yesterday and I am reviewing it today.  We discussed in detail the procedure, benefits, risk, and complications.  Both verbalized her  understanding and wished to proceed as soon as possible.  I answered all the questions.  Patient will be made n.p.o. after midnight tonight for procedure tomorrow.  I recommend we hold the patient's Lovenox  injection and start patient on heparin infusion.  DVT prophylaxis: Lovenox  50 mg subcu every 24 hours.   Annamaria Barrette [facility] 06/12/2023 7:43 AM

## 2023-06-12 NOTE — Progress Notes (Signed)
 Date of Admission:  06/07/2023     ID: Leonidas Dunstan is a 73 y.o. male  Principal Problem:   Cellulitis Active Problems:   Gangrene of toe of right foot (HCC)   PAD (peripheral artery disease) (HCC)   Cellulitis of right foot   Diabetic foot infection (HCC)    Subjective: Swelling less He does not feel hot or has chills  Medications:   aspirin  81 mg Oral Daily   atorvastatin  40 mg Oral Daily   carvedilol   3.125 mg Oral BID WC   furosemide   40 mg Oral Daily   insulin  aspart  0-9 Units Subcutaneous TID WC   insulin  glargine-yfgn  45 Units Subcutaneous QHS   sacubitril -valsartan   1 tablet Oral BID   spironolactone   25 mg Oral Daily    Objective: Vital signs in last 24 hours: Patient Vitals for the past 24 hrs:  BP Temp Temp src Pulse Resp SpO2  06/12/23 1514 136/67 100 F (37.8 C) -- 71 16 99 %  06/12/23 0730 123/72 98.8 F (37.1 C) Oral 72 17 98 %  06/12/23 0504 (!) 119/54 100.2 F (37.9 C) Oral 72 15 99 %  06/11/23 2119 124/70 98.8 F (37.1 C) Oral 71 20 100 %  06/11/23 1632 (!) 160/77 (!) 100.8 F (38.2 C) Oral 72 16 100 %       PHYSICAL EXAM:  General: Alert, cooperative, no distress, appears stated age.  Lungs: Clear to auscultation bilaterally. No Wheezing or Rhonchi. No rales. Heart: Regular rate and rhythm, no murmur, rub or gallop. Abdomen: Soft, non-tender,not distended. Bowel sounds normal. No masses Extremities:         Skin: No rashes or lesions. Or bruising Lymph: Cervical, supraclavicular normal. Neurologic: Grossly non-focal  Lab Results    Latest Ref Rng & Units 06/12/2023    5:08 AM 06/11/2023    3:15 AM 06/10/2023    5:30 AM  CBC  WBC 4.0 - 10.5 K/uL 18.8  18.1  16.4   Hemoglobin 13.0 - 17.0 g/dL 16.1  09.6  04.5   Hematocrit 39.0 - 52.0 % 32.3  34.1  32.9   Platelets 150 - 400 K/uL 331  281  283        Latest Ref Rng & Units 06/12/2023    5:08 AM 06/11/2023    3:15 AM 06/10/2023    5:30 AM  CMP  Glucose 70 - 99 mg/dL 409   62  64   BUN 8 - 23 mg/dL 11  10  10    Creatinine 0.61 - 1.24 mg/dL 8.11  9.14  7.82   Sodium 135 - 145 mmol/L 134  131  136   Potassium 3.5 - 5.1 mmol/L 4.2  4.2  3.8   Chloride 98 - 111 mmol/L 99  96  101   CO2 22 - 32 mmol/L 25  25  26    Calcium 8.9 - 10.3 mg/dL 8.8  8.8  8.3         Studies/Results: US  ARTERIAL ABI (SCREENING LOWER EXTREMITY) Result Date: 06/10/2023 CLINICAL DATA:  Right foot ulcer. Hypertension. Hyperlipidemia. Diabetes. EXAM: NONINVASIVE PHYSIOLOGIC VASCULAR STUDY OF BILATERAL LOWER EXTREMITIES TECHNIQUE: Evaluation of both lower extremities were performed at rest, including calculation of ankle-brachial indices with single level pressure measurements and doppler recording. COMPARISON:  None available. FINDINGS: Right ABI:  0.58 Left ABI:  0.86 Right Lower Extremity: Posterior tibial and dorsalis pedis artery waveforms are monophasic. Left Lower Extremity: Posterior tibial and dorsalis pedis  artery waveforms are monophasic. 0.5-0.79 Moderate PAD IMPRESSION: Findings indicative of moderate right and mild left lower extremity peripheral arterial disease. Electronically Signed   By: Elester Grim M.D.   On: 06/10/2023 17:24     Assessment/Plan: 73 yr male with DM presents with pain rt foot of 2 weeks duration   Rt foot swelling and erythema and tenderness with interdigital cleft maceration between 3/4 th toe with discharge 4th toe discolored  soft tissue infection of the rt foot- MRI shows non viable [place] tissue extending from the interdital cleft 3/4 along the dorsum Pt on unasyn and linezolid. Change unasyn to zosyn Wbc going up- this could be due to ischemia. Non viable tissue  will need debridement VS more aggressive surgery   ABI moderately reduced rt side Vascular will be taking him for angio   DM- no recent A1c   Htn   CAD s/p CABG   RT TKA) no evidence of PJI ? Discussed the management with the patient

## 2023-06-12 NOTE — Progress Notes (Addendum)
 PHARMACY - ANTICOAGULATION CONSULT NOTE  Pharmacy Consult for heparin drip Indication: Severe PAD   No Known Allergies  Patient Measurements: Height: 6' (182.9 cm) Weight: 108 kg (238 lb) IBW/kg (Calculated) : 77.6 HEPARIN DW (KG): 100.3  Vital Signs: Temp: 98.8 F (37.1 C) (04/30 0730) Temp Source: Oral (04/30 0730) BP: 123/72 (04/30 0730) Pulse Rate: 72 (04/30 0730)  Labs: Recent Labs    06/10/23 0530 06/11/23 0315 06/12/23 0508  HGB 11.4* 11.6* 11.0*  HCT 32.9* 34.1* 32.3*  PLT 283 281 331  CREATININE 1.34* 1.27* 1.18    Estimated Creatinine Clearance: 74 mL/min (by C-G formula based on SCr of 1.18 mg/dL).   Medical History: Past Medical History:  Diagnosis Date   Arthritis    CHF (congestive heart failure) (HCC)    Diabetes mellitus without complication (HCC)    Erectile dysfunction    History of kidney stones    Hypertension    Sleep apnea     Medications:  Medications Prior to Admission  Medication Sig Dispense Refill Last Dose/Taking   acetaminophen  (TYLENOL ) 325 MG tablet Take 650 mg by mouth every 6 (six) hours as needed for mild pain, headache, fever or moderate pain.   Taking As Needed   ASPIRIN 81 PO Take by mouth daily.   Past Week   atorvastatin (LIPITOR) 40 MG tablet TAKE 1 TABLET BY MOUTH EVERY DAY *NEW PRESCRIPTION REQUEST* 90 tablet 1 Past Week   carvedilol  (COREG ) 3.125 MG tablet TAKE ONE (1) TABLET BY MOUTH TWICE DAILY *NEW PRESCRIPTION REQUEST* 180 tablet 1 Past Week   cholecalciferol (VITAMIN D) 25 MCG (1000 UT) tablet Take 1,000 Units by mouth daily.   Past Week   dapagliflozin  propanediol (FARXIGA ) 10 MG TABS tablet TAKE 1 TABLET BY MOUTH EVERY DAY BEFORE BREAKFAST *NEW PRESCRIPTION REQUEST* 90 tablet 1 06/07/2023   Dulaglutide 1.5 MG/0.5ML SOPN Inject 1.5 mg into the skin once a week. Mondays   Taking   furosemide  (LASIX ) 40 MG tablet TAKE 1 TABLET BY MOUTH EVERY DAY *NEW PRESCRIPTION REQUEST* 90 tablet 1 Past Week   glipiZIDE  (GLUCOTROL) 10 MG tablet Take 10 mg by mouth daily before breakfast.   06/06/2023   insulin  glargine (LANTUS ) 100 UNIT/ML Solostar Pen Inject 40-80 Units into the skin at bedtime.   06/06/2023   Niacin, Antihyperlipidemic, 500 MG TABS Take 500 mg by mouth daily with breakfast.   Past Week   sacubitril -valsartan  (ENTRESTO ) 97-103 MG Take 1 tablet by mouth 2 (two) times daily. 180 tablet 1 06/07/2023 Morning   spironolactone  (ALDACTONE ) 25 MG tablet TAKE 1 TABLET BY MOUTH EVERY DAY *NEW PRESCRIPTION REQUEST* 90 tablet 1 Past Week   tadalafil (CIALIS) 5 MG tablet Take by mouth daily as needed for erectile dysfunction.   Taking As Needed   vitamin B-12 (CYANOCOBALAMIN) 500 MCG tablet Take 500 mcg by mouth daily.   06/07/2023    Assessment: 73 yo male to start heparin drip for severe PAD with anticipated angiogram on 5/1 Hx: arthritis, CHF, diabetes mellitus 2, kidney stones, hypertension and sleep apnea  On Lovenox  50 mg q24h for DVT ppx with last dose 4/29@2123  Baseline labs:  Hgb 11.0  Plt 331   aPTT 30   INR 1.3  Goal of Therapy:  Heparin level 0.3-0.7 units/ml Monitor platelets by anticoagulation protocol: Yes   Plan:  Give 4000 units bolus x 1 Start heparin infusion at 1700 units/hr Check anti-Xa level in 6 hours and daily while on heparin Continue to monitor H&H and  platelets  Maitland Lesiak PharmD Clinical Pharmacist 06/12/2023

## 2023-06-12 NOTE — Progress Notes (Signed)
 PROGRESS NOTE    Scott Hale  LOV:[id number] DOB: [birth year] DOA: 06/07/2023 PCP: Lyle San, MD    Brief Narrative:   Scott Hale is a 73 y.o. male with medical history significant of DM2, HTN, CKD who presented with right foot pain.    Assessment & Plan:   Principal Problem:   Cellulitis Active Problems:   Gangrene of toe of right foot (HCC)   PAD (peripheral artery disease) (HCC)   Cellulitis of right foot   Diabetic foot infection (HCC)  RLE cellulitis  Possible osteomyelitis and septic arthritis --received vanc/cefe/flagyl  in the ED, then switched to Ancef .  No open wounds on presentation, but wounds opened up in between toes on 4/28.  ID and podiatry consulted.  Abx switched to abx to Unasyn and Zyvox, per ID. --MRI right foot showed multiple areas of osteomyelitis Plan: Continue IV antibiotics per ID recommendation WBAT right foot, postop shoe ordered Local wound care Vascular planning angiography 5/1 SQ Lovenox  stopped.  Heparin drip initiated  PAD --ABI with Findings indicative of moderate right and mild left lower extremity peripheral arterial disease.  --Vascular surgery consulted Plan: Hep gtt Angio tomorrow  Severe Sepsis --fever, leukocytosis, with AKI, source cellulitis   DM2 --recent A1c 7.6.  Pt reported taking Levemir 80u nightly at home. --Continue glargine to 45u nightly --ACHS and SSI   HTN --resume coreg    AKI on CKD 3a --Cr 1.85 on presentation, improved to 1.37 with IVF. --oral hydration now   Hyponatremia --of unclear significance   DVT prophylaxis: hep gtt Code Status: Full Family Communication: Friend at bedside 4/30 Disposition Plan: Status is: Inpatient Remains inpatient appropriate because: Lower extremity osteomyelitis   Level of care: Med-Surg  Consultants:  ID Podiatry Vascular surgery  Procedures:  Lower extremity angiogram 5/1  Antimicrobials: Unasyn Zyvox   Subjective: Seen and examined.   Resting in bed.  Edema controlled.  No specific complaints at time of my interview  Objective: Vitals:   06/11/23 1632 06/11/23 2119 06/12/23 0504 06/12/23 0730  BP: (!) 160/77 124/70 (!) 119/54 123/72  Pulse: 72 71 72 72  Resp: 16 20 15 17   Temp: (!) 100.8 F (38.2 C) 98.8 F (37.1 C) 100.2 F (37.9 C) 98.8 F (37.1 C)  TempSrc: Oral Oral Oral Oral  SpO2: 100% 100% 99% 98%  Weight:      Height:        Intake/Output Summary (Last 24 hours) at 06/12/2023 1252 Last data filed at 06/12/2023 1030 Gross per 24 hour  Intake 360 ml  Output 500 ml  Net -140 ml   Filed Weights   06/07/23 1453  Weight: 108 kg    Examination:  General exam: Appears calm and comfortable  Respiratory system: Clear to auscultation. Respiratory effort normal. Cardiovascular system: S1-S2, RRR, no murmurs, no pedal edema Gastrointestinal system: Soft, NT/ND, normal bowel Central nervous system: Alert and oriented. No focal neurological deficits. Extremities: Bilateral lower extremities with chronic changes.  Toes macerated. Skin: No rashes, lesions or ulcers Psychiatry: Judgement and insight appear normal. Mood & affect appropriate.     Data Reviewed: I have personally reviewed following labs and imaging studies  CBC: Recent Labs  Lab 06/07/23 1653 06/08/23 0440 06/09/23 0454 06/10/23 0530 06/11/23 0315 06/12/23 0508  WBC 15.6* 15.0* 15.8* 16.4* 18.1* 18.8*  NEUTROABS 12.6*  --   --   --   --   --   HGB 13.2 11.7* 11.8* 11.4* 11.6* 11.0*  HCT 38.9* 34.1* 34.5* 32.9* 34.1* 32.3*  MCV 90.5 88.3 88.7 87.5 89.3 89.0  PLT 255 226 253 283 281 331   Basic Metabolic Panel: Recent Labs  Lab 06/08/23 0440 06/09/23 0454 06/10/23 0530 06/11/23 0315 06/12/23 0508  NA 131* 131* 136 131* 134*  K 3.5 3.6 3.8 4.2 4.2  CL 103 99 101 96* 99  CO2 24 23 26 25 25   GLUCOSE 132* 111* 64* 62* 100*  BUN 13 12 10 10 11   CREATININE 1.62* 1.37* 1.34* 1.27* 1.18  CALCIUM 8.1* 8.4* 8.3* 8.8* 8.8*  MG 2.2   --   --   --   --    GFR: Estimated Creatinine Clearance: 74 mL/min (by C-G formula based on SCr of 1.18 mg/dL). Liver Function Tests: No results for input(s): "AST", "ALT", "ALKPHOS", "BILITOT", "PROT", "ALBUMIN" in the last 168 hours. No results for input(s): "LIPASE", "AMYLASE" in the last 168 hours. No results for input(s): "AMMONIA" in the last 168 hours. Coagulation Profile: Recent Labs  Lab 06/12/23 1209  INR 1.3*   Cardiac Enzymes: No results for input(s): "CKTOTAL", "CKMB", "CKMBINDEX", "TROPONINI" in the last 168 hours. BNP (last 3 results) No results for input(s): "PROBNP" in the last 8760 hours. HbA1C: Recent Labs    06/10/23 0530  HGBA1C 7.5*   CBG: Recent Labs  Lab 06/11/23 1132 06/11/23 1713 06/11/23 2122 06/12/23 0727 06/12/23 1215  GLUCAP 192* 200* 148* 145* 200*   Lipid Profile: No results for input(s): "CHOL", "HDL", "LDLCALC", "TRIG", "CHOLHDL", "LDLDIRECT" in the last 72 hours. Thyroid Function Tests: No results for input(s): "TSH", "T4TOTAL", "FREET4", "T3FREE", "THYROIDAB" in the last 72 hours. Anemia Panel: No results for input(s): "VITAMINB12", "FOLATE", "FERRITIN", "TIBC", "IRON", "RETICCTPCT" in the last 72 hours. Sepsis Labs: Recent Labs  Lab 06/07/23 1725  LATICACIDVEN 1.5    Recent Results (from the past 240 hours)  Resp panel by RT-PCR (RSV, Flu A&B, Covid) Anterior Nasal Swab     Status: None   Collection Time: 06/07/23  4:53 PM   Specimen: Anterior Nasal Swab  Result Value Ref Range Status   SARS Coronavirus 2 by RT PCR NEGATIVE NEGATIVE Final    Comment: (NOTE) SARS-CoV-2 target nucleic acids are NOT DETECTED.  The SARS-CoV-2 RNA is generally detectable in upper respiratory specimens during the acute phase of infection. The lowest concentration of SARS-CoV-2 viral copies this assay can detect is 138 copies/mL. A negative result does not preclude SARS-Cov-2 infection and should not be used as the sole basis for treatment  or other patient management decisions. A negative result may occur with  improper specimen collection/handling, submission of specimen other than nasopharyngeal swab, presence of viral mutation(s) within the areas targeted by this assay, and inadequate number of viral copies(<138 copies/mL). A negative result must be combined with clinical observations, patient history, and epidemiological information. The expected result is Negative.  Fact Sheet for Patients:  BloggerCourse.com  Fact Sheet for Healthcare Providers:  SeriousBroker.it  This test is no t yet approved or cleared by the [place]  FDA and  has been authorized for detection and/or diagnosis of SARS-CoV-2 by FDA under an Emergency Use Authorization (EUA). This EUA will remain  in effect (meaning this test can be used) for the duration of the COVID-19 declaration under Section 564(b)(1) of the Act, 21 U.S.C.section 360bbb-3(b)(1), unless the authorization is terminated  or revoked sooner.       Influenza A by PCR NEGATIVE NEGATIVE Final   Influenza B by PCR NEGATIVE NEGATIVE Final    Comment: (NOTE) The Xpert  Xpress SARS-CoV-2/FLU/RSV plus assay is intended as an aid in the diagnosis of influenza from Nasopharyngeal swab specimens and should not be used as a sole basis for treatment. Nasal washings and aspirates are unacceptable for Xpert Xpress SARS-CoV-2/FLU/RSV testing.  Fact Sheet for Patients: BloggerCourse.com  Fact Sheet for Healthcare Providers: SeriousBroker.it  This test is not yet approved or cleared by the [place]  FDA and has been authorized for detection and/or diagnosis of SARS-CoV-2 by FDA under an Emergency Use Authorization (EUA). This EUA will remain in effect (meaning this test can be used) for the duration of the COVID-19 declaration under Section 564(b)(1) of the Act, 21 U.S.C. section  360bbb-3(b)(1), unless the authorization is terminated or revoked.     Resp Syncytial Virus by PCR NEGATIVE NEGATIVE Final    Comment: (NOTE) Fact Sheet for Patients: BloggerCourse.com  Fact Sheet for Healthcare Providers: SeriousBroker.it  This test is not yet approved or cleared by the [place]  FDA and has been authorized for detection and/or diagnosis of SARS-CoV-2 by FDA under an Emergency Use Authorization (EUA). This EUA will remain in effect (meaning this test can be used) for the duration of the COVID-19 declaration under Section 564(b)(1) of the Act, 21 U.S.C. section 360bbb-3(b)(1), unless the authorization is terminated or revoked.  Performed at [facility], [address] [place], [place] [address]   Culture, blood (routine x 2)     Status: None   Collection Time: 06/07/23  5:25 PM   Specimen: BLOOD  Result Value Ref Range Status   Specimen Description BLOOD RIGHT ANTECUBITAL  Final   Special Requests   Final    BOTTLES DRAWN AEROBIC AND ANAEROBIC Blood Culture results may not be optimal due to an inadequate volume of blood received in culture bottles   Culture   Final    NO GROWTH 5 DAYS Performed at [facility], [address] [place], [place] [address]    Report Status 06/12/2023 FINAL  Final  Culture, blood (routine x 2)     Status: None   Collection Time: 06/07/23  5:27 PM   Specimen: BLOOD  Result Value Ref Range Status   Specimen Description BLOOD LEFT ANTECUBITAL  Final   Special Requests   Final    BOTTLES DRAWN AEROBIC AND ANAEROBIC Blood Culture results may not be optimal due to an inadequate volume of blood received in culture bottles   Culture   Final    NO GROWTH 5 DAYS Performed at [facility], [address] [place], [place] [address]    Report Status 06/12/2023 FINAL  Final  Aerobic Culture w Gram Stain (superficial specimen)     Status: None  (Preliminary result)   Collection Time: 06/10/23  7:35 PM   Specimen: Wound  Result Value Ref Range Status   Specimen Description   Final    WOUND Performed at [facility], [address], [place], [place] [address]    Special Requests   Final    RIGHT FOOT Performed at [facility], [address] [place], [place] [address]    Gram Stain NO WBC SEEN FEW GRAM POSITIVE COCCI IN PAIRS   Final   Culture   Final    CULTURE REINCUBATED FOR BETTER GROWTH Performed at [facility], [address] [place], [place] [address]    Report Status PENDING  Incomplete         Radiology Studies: MR FOOT RIGHT W WO CONTRAST Result Date: 06/10/2023 CLINICAL  DATA:  Right foot wound with concern for abscess and osteomyelitis. EXAM: MRI OF THE RIGHT FOREFOOT WITHOUT AND WITH CONTRAST TECHNIQUE: Multiplanar, multisequence MR imaging of the right foot was performed before and after the administration of intravenous contrast. CONTRAST:  10mL GADAVIST GADOBUTROL 1 MMOL/ML IV SOLN COMPARISON:  Right foot radiographs dated 06/07/2023. FINDINGS: Bones/Joint/Cartilage/Soft tissue Diffuse subcutaneous edema of the midfoot and forefoot with enhancement dorsally. Cutaneous irregularity with soft tissue defect/wound at the dorsal third intermetatarsal space, overlying the third and fourth proximal phalanges. This wound demonstrates peripheral T2 hyperintense signal with heterogenous intermediate to low signal centrally without significant associated enhancement, suggestive of wound with devitalized/necrotic tissue, and extends deep through the third intermetatarsal space (series 6, image 19) along the plantar subcutaneous tissues medially to the level of the base of second proximal phalanx and laterally to the level of the fifth MTP joint. There is marrow edema with associated T1 hypointensity of the base of the third and fourth proximal phalanges in the heads of the third and fourth  metatarsals at the level of the third and fourth MTP joints, compatible with osteomyelitis/septic arthritis. Marrow signal abnormality with enhancement at the head of fifth metatarsal, concerning for osteomyelitis. T2 hyperintense marrow edema with mild enhancement at the base of the second proximal phalanx could reflect reactive marrow changes, however, osteomyelitis can not be excluded. No acute fracture or dislocation. Mild degenerative changes of the first MTP joint. No significant joint effusion. Diffuse interphalangeal joint space narrowing. Ligaments Collateral ligaments are intact. Muscles and Tendons Flexor and extensor tendons are intact. No significant tenosynovitis. Diffuse edema with enhancement of the intrinsic musculature of the foot, concerning for myositis. IMPRESSION: 1. Soft tissue defect/wound at the dorsal third intermetatarsal space, overlying the third and fourth proximal phalanges, extends deep through the third intermetatarsal space and along the plantar subcutaneous tissues medially to the level of the base of second proximal phalanx and laterally to the level of the fifth MTP joint. This wound demonstrates peripheral T2 hyperintense signal with heterogenous intermediate to low signal centrally without significant associated enhancement, suggestive of wound with devitalized/necrotic tissue. There is pronounced surrounding soft tissue edema and enhancement, compatible with cellulitis/myositis. 2. Marrow signal abnormality with enhancement of the base of the third and fourth proximal phalanges and the heads of the third and fourth metatarsals at the level of the third and fourth MTP joints, compatible with osteomyelitis/septic arthritis. Marrow signal abnormality with enhancement at the head of fifth metatarsal, concerning for osteomyelitis. 3. Marrow edema with mild enhancement at the base of the second proximal phalanx could reflect reactive marrow changes, however, osteomyelitis can not be  excluded. Electronically Signed   By: Mannie Seek M.D.   On: 06/10/2023 17:53   US  ARTERIAL ABI (SCREENING LOWER EXTREMITY) Result Date: 06/10/2023 CLINICAL DATA:  Right foot ulcer. Hypertension. Hyperlipidemia. Diabetes. EXAM: NONINVASIVE PHYSIOLOGIC VASCULAR STUDY OF BILATERAL LOWER EXTREMITIES TECHNIQUE: Evaluation of both lower extremities were performed at rest, including calculation of ankle-brachial indices with single level pressure measurements and doppler recording. COMPARISON:  None available. FINDINGS: Right ABI:  0.58 Left ABI:  0.86 Right Lower Extremity: Posterior tibial and dorsalis pedis artery waveforms are monophasic. Left Lower Extremity: Posterior tibial and dorsalis pedis artery waveforms are monophasic. 0.5-0.79 Moderate PAD IMPRESSION: Findings indicative of moderate right and mild left lower extremity peripheral arterial disease. Electronically Signed   By: Elester Grim M.D.   On: 06/10/2023 17:24        Scheduled Meds:  aspirin  81  mg Oral Daily   atorvastatin  40 mg Oral Daily   carvedilol   3.125 mg Oral BID WC   furosemide   40 mg Oral Daily   heparin  4,000 Units Intravenous Once   insulin  aspart  0-9 Units Subcutaneous TID WC   insulin  glargine-yfgn  45 Units Subcutaneous QHS   sacubitril -valsartan   1 tablet Oral BID   spironolactone   25 mg Oral Daily   Continuous Infusions:  ampicillin-sulbactam (UNASYN) IV 3 g (06/12/23 1232)   heparin     linezolid (ZYVOX) IV 600 mg (06/12/23 0827)     LOS: 4 days    Tiajuana Fluke, MD [facility]   If 7PM-7AM, please contact night-coverage  06/12/2023, 12:52 PM

## 2023-06-12 NOTE — Progress Notes (Signed)
 PHARMACY - ANTICOAGULATION CONSULT NOTE  Pharmacy Consult for heparin drip Indication: Severe PAD   No Known Allergies  Patient Measurements: Height: 6' (182.9 cm) Weight: 108 kg (238 lb) IBW/kg (Calculated) : 77.6 HEPARIN DW (KG): 100.3  Vital Signs: Temp: 98.8 F (37.1 C) (04/30 0730) Temp Source: Oral (04/30 0730) BP: 123/72 (04/30 0730) Pulse Rate: 72 (04/30 0730)  Labs: Recent Labs    06/10/23 0530 06/11/23 0315 06/12/23 0508 06/12/23 1209  HGB 11.4* 11.6* 11.0*  --   HCT 32.9* 34.1* 32.3*  --   PLT 283 281 331  --   APTT  --   --   --  30  LABPROT  --   --   --  16.1*  INR  --   --   --  1.3*  CREATININE 1.34* 1.27* 1.18  --     Estimated Creatinine Clearance: 74 mL/min (by C-G formula based on SCr of 1.18 mg/dL).   Medical History: Past Medical History:  Diagnosis Date   Arthritis    CHF (congestive heart failure) (HCC)    Diabetes mellitus without complication (HCC)    Erectile dysfunction    History of kidney stones    Hypertension    Sleep apnea     Medications:  Medications Prior to Admission  Medication Sig Dispense Refill Last Dose/Taking   acetaminophen  (TYLENOL ) 325 MG tablet Take 650 mg by mouth every 6 (six) hours as needed for mild pain, headache, fever or moderate pain.   Taking As Needed   ASPIRIN 81 PO Take by mouth daily.   Past Week   atorvastatin (LIPITOR) 40 MG tablet TAKE 1 TABLET BY MOUTH EVERY DAY *NEW PRESCRIPTION REQUEST* 90 tablet 1 Past Week   carvedilol  (COREG ) 3.125 MG tablet TAKE ONE (1) TABLET BY MOUTH TWICE DAILY *NEW PRESCRIPTION REQUEST* 180 tablet 1 Past Week   cholecalciferol (VITAMIN D) 25 MCG (1000 UT) tablet Take 1,000 Units by mouth daily.   Past Week   dapagliflozin  propanediol (FARXIGA ) 10 MG TABS tablet TAKE 1 TABLET BY MOUTH EVERY DAY BEFORE BREAKFAST *NEW PRESCRIPTION REQUEST* 90 tablet 1 06/07/2023   Dulaglutide 1.5 MG/0.5ML SOPN Inject 1.5 mg into the skin once a week. Mondays   Taking   furosemide  (LASIX )  40 MG tablet TAKE 1 TABLET BY MOUTH EVERY DAY *NEW PRESCRIPTION REQUEST* 90 tablet 1 Past Week   glipiZIDE (GLUCOTROL) 10 MG tablet Take 10 mg by mouth daily before breakfast.   06/06/2023   insulin  glargine (LANTUS ) 100 UNIT/ML Solostar Pen Inject 40-80 Units into the skin at bedtime.   06/06/2023   Niacin, Antihyperlipidemic, 500 MG TABS Take 500 mg by mouth daily with breakfast.   Past Week   sacubitril -valsartan  (ENTRESTO ) 97-103 MG Take 1 tablet by mouth 2 (two) times daily. 180 tablet 1 06/07/2023 Morning   spironolactone  (ALDACTONE ) 25 MG tablet TAKE 1 TABLET BY MOUTH EVERY DAY *NEW PRESCRIPTION REQUEST* 90 tablet 1 Past Week   tadalafil (CIALIS) 5 MG tablet Take by mouth daily as needed for erectile dysfunction.   Taking As Needed   vitamin B-12 (CYANOCOBALAMIN) 500 MCG tablet Take 500 mcg by mouth daily.   06/07/2023    Assessment: 73 yo male to start heparin drip for severe PAD with anticipated angiogram on 5/1 Hx: arthritis, CHF, diabetes mellitus 2, kidney stones, hypertension and sleep apnea  On Lovenox  50 mg q24h for DVT ppx with last dose 4/29@2123  30s   INR 1.3  Goal of Therapy:  Heparin level 0.3-0.7  units/ml Monitor platelets by anticoagulation protocol: Yes   Plan: heparin level undetectable ---will give 3000 units bolus x 1 ---will increase heparin infusion to 2100 units/hr ---Check anti-Xa level in 6 hours after rate change  ---Continue to monitor H&H and platelets  Barney Boozer, PharmD, BCPS Clinical Pharmacist 06/12/2023

## 2023-06-12 NOTE — Plan of Care (Signed)
  Problem: Education: Goal: Ability to describe self-care measures that may prevent or decrease complications (Diabetes Survival Skills Education) will improve Outcome: Progressing   Problem: Coping: Goal: Ability to adjust to condition or change in health will improve Outcome: Progressing   Problem: Fluid Volume: Goal: Ability to maintain a balanced intake and output will improve Outcome: Progressing   Problem: Health Behavior/Discharge Planning: Goal: Ability to identify and utilize available resources and services will improve Outcome: Progressing Goal: Ability to manage health-related needs will improve Outcome: Progressing   Problem: Metabolic: Goal: Ability to maintain appropriate glucose levels will improve Outcome: Progressing   Problem: Nutritional: Goal: Maintenance of adequate nutrition will improve Outcome: Progressing Goal: Progress toward achieving an optimal weight will improve Outcome: Progressing   Problem: Skin Integrity: Goal: Risk for impaired skin integrity will decrease Outcome: Progressing   Problem: Tissue Perfusion: Goal: Adequacy of tissue perfusion will improve Outcome: Progressing   Problem: Clinical Measurements: Goal: Ability to avoid or minimize complications of infection will improve Outcome: Progressing   Problem: Skin Integrity: Goal: Skin integrity will improve Outcome: Progressing   Problem: Education: Goal: Knowledge of General Education information will improve Description: Including pain rating scale, medication(s)/side effects and non-pharmacologic comfort measures Outcome: Progressing   Problem: Health Behavior/Discharge Planning: Goal: Ability to manage health-related needs will improve Outcome: Progressing   Problem: Clinical Measurements: Goal: Ability to maintain clinical measurements within normal limits will improve Outcome: Progressing Goal: Will remain free from infection Outcome: Progressing Goal: Diagnostic  test results will improve Outcome: Progressing Goal: Respiratory complications will improve Outcome: Progressing Goal: Cardiovascular complication will be avoided Outcome: Progressing   Problem: Activity: Goal: Risk for activity intolerance will decrease Outcome: Progressing   Problem: Nutrition: Goal: Adequate nutrition will be maintained Outcome: Progressing   Problem: Coping: Goal: Level of anxiety will decrease Outcome: Progressing   Problem: Elimination: Goal: Will not experience complications related to bowel motility Outcome: Progressing Goal: Will not experience complications related to urinary retention Outcome: Progressing   Problem: Pain Managment: Goal: General experience of comfort will improve and/or be controlled Outcome: Progressing   Problem: Safety: Goal: Ability to remain free from injury will improve Outcome: Progressing   Problem: Skin Integrity: Goal: Risk for impaired skin integrity will decrease Outcome: Progressing

## 2023-06-12 NOTE — H&P (View-Only) (Signed)
 Progress Note    06/12/2023 7:43 AM * No surgery found *  Subjective:  This is a 73 y.o. male with a past medical history of arthritis, CHF, diabetes mellitus 2, kidney stones, hypertension and sleep apnea.  Patient presents to [facility] emergency department on 06/07/2023 with right foot pain.  Patient endorses right foot pain started about 2 weeks ago.  He was working out and came down on his foot wrong.  Since that time his foot started swelling became red and developed a streak up toward his knee. Patient endorses that he has noticed over the last week he changes to his toes on his right foot from the 3rd-5th toe.   Review of Systems  Constitutional:  Constitutional negative. HENT: HENT negative.  Respiratory: Respiratory negative.  Cardiovascular: Cardiovascular negative.  GI: Gastrointestinal negative.  GU: Genitourinary negative. Musculoskeletal: Positive for leg pain.  Skin: Positive for wound.  Neurological: Neurological negative. Psychiatric: Psychiatric negative.  All other systems reviewed and are negative   Vitals:   06/12/23 0504 06/12/23 0730  BP: (!) 119/54 123/72  Pulse: 72 72  Resp: 15 17  Temp: 100.2 F (37.9 C) 98.8 F (37.1 C)  SpO2: 99% 98%   Physical Exam: Cardiac:  RRR, normal S1 and S2, no rubs clicks gallops or murmurs noted. Lungs: Lungs clear on auscultation throughout, nonlabored breathing.  No rales rhonchi or wheezing noted. Incisions: None Extremities: Bilateral lower extremities with +1 edema.  Right lower extremity foot with +2 edema.  Unable to palpate pulses in bilateral lower extremities.  Both lower extremities are warm to touch. Abdomen: Positive bowel sounds throughout, soft, nontender nondistended. Neurologic: Alert and oriented x 3, answers all questions and follows commands appropriately.  CBC    Component Value Date/Time   WBC 18.8 (H) 06/12/2023 0508   RBC 3.63 (L) 06/12/2023 0508   HGB 11.0 (L) 06/12/2023 0508   HCT 32.3 (L)  06/12/2023 0508   PLT 331 06/12/2023 0508   MCV 89.0 06/12/2023 0508   MCH 30.3 06/12/2023 0508   MCHC 34.1 06/12/2023 0508   RDW 13.6 06/12/2023 0508   LYMPHSABS 1.4 06/07/2023 1653   MONOABS 1.5 (H) 06/07/2023 1653   EOSABS 0.1 06/07/2023 1653   BASOSABS 0.0 06/07/2023 1653    BMET    Component Value Date/Time   NA 134 (L) 06/12/2023 0508   K 4.2 06/12/2023 0508   CL 99 06/12/2023 0508   CO2 25 06/12/2023 0508   GLUCOSE 100 (H) 06/12/2023 0508   BUN 11 06/12/2023 0508   CREATININE 1.18 06/12/2023 0508   CALCIUM 8.8 (L) 06/12/2023 0508   GFRNONAA >60 06/12/2023 0508   GFRAA >60 12/15/2018 1326    INR No results found for: "INR"   Intake/Output Summary (Last 24 hours) at 06/12/2023 0743 Last data filed at 06/11/2023 2258 Gross per 24 hour  Intake --  Output 500 ml  Net -500 ml     Assessment/Plan:  73 y.o. male who presents to [facility] emergency department with right foot pain and noted open sores to right foot foot between his 2nd, 3rd and 4th toes.* No surgery found *   Plan Vascular surgery plans on taking the patient to the vascular lab tomorrow on 06/13/2023 for right lower extremity angiogram with possible intervention.  I had a long detailed discussion with the patient and his wife at the bedside yesterday and I am reviewing it today.  We discussed in detail the procedure, benefits, risk, and complications.  Both verbalized her  understanding and wished to proceed as soon as possible.  I answered all the questions.  Patient will be made n.p.o. after midnight tonight for procedure tomorrow.  I recommend we hold the patient's Lovenox  injection and start patient on heparin infusion.  DVT prophylaxis: Lovenox  50 mg subcu every 24 hours.   Annamaria Barrette [facility] 06/12/2023 7:43 AM

## 2023-06-13 ENCOUNTER — Encounter
Admission: EM | Disposition: A | Discharge status: Home Health | Payer: Self-pay | Source: Home / Self Care | Attending: Internal Medicine

## 2023-06-13 DIAGNOSIS — L089 Local infection of the skin and subcutaneous tissue, unspecified: Secondary | ICD-10-CM | POA: Diagnosis not present

## 2023-06-13 DIAGNOSIS — L97519 Non-pressure chronic ulcer of other part of right foot with unspecified severity: Secondary | ICD-10-CM

## 2023-06-13 DIAGNOSIS — E11628 Type 2 diabetes mellitus with other skin complications: Secondary | ICD-10-CM | POA: Diagnosis not present

## 2023-06-13 DIAGNOSIS — M7989 Other specified soft tissue disorders: Secondary | ICD-10-CM | POA: Diagnosis not present

## 2023-06-13 DIAGNOSIS — I708 Atherosclerosis of other arteries: Secondary | ICD-10-CM | POA: Diagnosis not present

## 2023-06-13 DIAGNOSIS — Z96651 Presence of right artificial knee joint: Secondary | ICD-10-CM | POA: Diagnosis not present

## 2023-06-13 DIAGNOSIS — L539 Erythematous condition, unspecified: Secondary | ICD-10-CM | POA: Diagnosis not present

## 2023-06-13 DIAGNOSIS — I70235 Atherosclerosis of native arteries of right leg with ulceration of other part of foot: Secondary | ICD-10-CM

## 2023-06-13 DIAGNOSIS — E1142 Type 2 diabetes mellitus with diabetic polyneuropathy: Secondary | ICD-10-CM

## 2023-06-13 DIAGNOSIS — L03115 Cellulitis of right lower limb: Secondary | ICD-10-CM | POA: Diagnosis not present

## 2023-06-13 HISTORY — PX: LOWER EXTREMITY ANGIOGRAPHY: CATH118251

## 2023-06-13 LAB — HEPARIN LEVEL (UNFRACTIONATED)
Heparin Unfractionated: 0.19 [IU]/mL — ABNORMAL LOW (ref 0.30–0.70)
Heparin Unfractionated: <0.1 [IU]/mL — ABNORMAL LOW (ref 0.30–0.70)

## 2023-06-13 LAB — GLUCOSE, CAPILLARY
Glucose-Capillary: 203 mg/dL — ABNORMAL HIGH (ref 70–99)
Glucose-Capillary: 134 mg/dL — ABNORMAL HIGH (ref 70–99)
Glucose-Capillary: 125 mg/dL — ABNORMAL HIGH (ref 70–99)
Glucose-Capillary: 112 mg/dL — ABNORMAL HIGH (ref 70–99)
Glucose-Capillary: 283 mg/dL — ABNORMAL HIGH (ref 70–99)
Glucose-Capillary: 202 mg/dL — ABNORMAL HIGH (ref 70–99)

## 2023-06-13 LAB — CBC
HCT: 31.1 % — ABNORMAL LOW (ref 39.0–52.0)
Hemoglobin: 10.5 g/dL — ABNORMAL LOW (ref 13.0–17.0)
MCH: 30.5 pg (ref 26.0–34.0)
MCHC: 33.8 g/dL (ref 30.0–36.0)
MCV: 90.4 fL (ref 80.0–100.0)
Platelets: 326 10*3/uL (ref 150–400)
RBC: 3.44 MIL/uL — ABNORMAL LOW (ref 4.22–5.81)
RDW: 13.8 % (ref 11.5–15.5)
WBC: 17.2 10*3/uL — ABNORMAL HIGH (ref 4.0–10.5)
nRBC: 0 % (ref 0.0–0.2)

## 2023-06-13 LAB — AEROBIC CULTURE W GRAM STAIN (SUPERFICIAL SPECIMEN): Gram Stain: NONE SEEN

## 2023-06-13 SURGERY — LOWER EXTREMITY ANGIOGRAPHY
Anesthesia: Moderate Sedation | Laterality: Right

## 2023-06-13 MED ORDER — LIDOCAINE-EPINEPHRINE (PF) 1 %-1:200000 IJ SOLN
INTRAMUSCULAR | Status: DC | PRN
  Administered 2023-06-13: 10 mL

## 2023-06-13 MED ORDER — HEPARIN SODIUM (PORCINE) 1000 UNIT/ML IJ SOLN
INTRAMUSCULAR | Status: AC
  Filled 2023-06-13: qty 10

## 2023-06-13 MED ORDER — DIPHENHYDRAMINE HCL 50 MG/ML IJ SOLN: 50.0000 mg | Freq: Once | INTRAMUSCULAR | Status: DC | PRN

## 2023-06-13 MED ORDER — FENTANYL CITRATE (PF) 100 MCG/2ML IJ SOLN
INTRAMUSCULAR | Status: DC | PRN
  Administered 2023-06-13: 25 ug via INTRAVENOUS
  Administered 2023-06-13: 50 ug via INTRAVENOUS

## 2023-06-13 MED ORDER — IODIXANOL 320 MG/ML IV SOLN
INTRAVENOUS | Status: DC | PRN
  Administered 2023-06-13: 55 mL

## 2023-06-13 MED ORDER — HEPARIN (PORCINE) IN NACL 2000-0.9 UNIT/L-% IV SOLN
INTRAVENOUS | Status: DC | PRN
  Administered 2023-06-13: 1000 mL

## 2023-06-13 MED ORDER — HEPARIN SODIUM (PORCINE) 1000 UNIT/ML IJ SOLN
INTRAMUSCULAR | Status: DC | PRN
  Administered 2023-06-13: 5000 [IU] via INTRAVENOUS

## 2023-06-13 MED ORDER — FENTANYL CITRATE (PF) 100 MCG/2ML IJ SOLN
INTRAMUSCULAR | Status: AC
  Filled 2023-06-13: qty 2

## 2023-06-13 MED ORDER — MIDAZOLAM HCL 2 MG/2ML IJ SOLN
INTRAMUSCULAR | Status: DC | PRN
Start: 2023-06-13 — End: 2023-06-13
  Administered 2023-06-13: 1 mg via INTRAVENOUS
  Administered 2023-06-13: 2 mg via INTRAVENOUS

## 2023-06-13 MED ORDER — FAMOTIDINE 20 MG PO TABS: 40.0000 mg | ORAL_TABLET | Freq: Once | ORAL | Status: DC | PRN

## 2023-06-13 MED ORDER — HEPARIN BOLUS VIA INFUSION
3000.0000 [IU] | Freq: Once | INTRAVENOUS | Status: AC
  Administered 2023-06-13: 3000 [IU] via INTRAVENOUS
  Filled 2023-06-13: qty 3000

## 2023-06-13 MED ORDER — SODIUM CHLORIDE 0.9 % IV SOLN: INTRAVENOUS | Status: DC

## 2023-06-13 MED ORDER — METHYLPREDNISOLONE SODIUM SUCC 125 MG IJ SOLR: 125.0000 mg | Freq: Once | INTRAMUSCULAR | Status: DC | PRN

## 2023-06-13 MED ORDER — ENOXAPARIN SODIUM 60 MG/0.6ML IJ SOSY: 0.5000 mg/kg | PREFILLED_SYRINGE | INTRAMUSCULAR | Status: DC

## 2023-06-13 MED ORDER — MIDAZOLAM HCL 5 MG/5ML IJ SOLN
INTRAMUSCULAR | Status: AC
  Filled 2023-06-13: qty 5

## 2023-06-13 MED ORDER — CLOPIDOGREL BISULFATE 75 MG PO TABS
75.0000 mg | ORAL_TABLET | Freq: Every day | ORAL | Status: DC
Start: 2023-06-13 — End: 2023-06-13

## 2023-06-13 MED ORDER — MIDAZOLAM HCL 2 MG/ML PO SYRP
8.0000 mg | ORAL_SOLUTION | Freq: Once | ORAL | Status: DC | PRN
  Filled 2023-06-13: qty 5

## 2023-06-13 SURGICAL SUPPLY — 24 items
BALLOON LUTONIX 018 5X300X130 (BALLOONS) IMPLANT
BALLOON LUTONIX 018 5X60X130 (BALLOONS) IMPLANT
BALLOON LUTONIX 018 6X150X130 (BALLOONS) IMPLANT
BALLOON ULTRVRSE 3.5X40X150 (BALLOONS) IMPLANT
BALLOON ULTRVRSE 3X300X150 OTW (BALLOONS) IMPLANT
BALLOON ULTRVRSE 4X40X150 OTW (BALLOONS) IMPLANT
CATH ANGIO 5F PIGTAIL 65CM (CATHETERS) IMPLANT
CATH BEACON 5 .038 100 VERT TP (CATHETERS) IMPLANT
CATH CXI 4F 90 DAV (CATHETERS) IMPLANT
CATH SEEKER .035X135CM (CATHETERS) IMPLANT
COVER PROBE ULTRASOUND 5X96 (MISCELLANEOUS) IMPLANT
DEVICE PRESTO INFLATION (MISCELLANEOUS) IMPLANT
DEVICE STARCLOSE SE CLOSURE (Vascular Products) IMPLANT
GLIDEWIRE ADV .035X260CM (WIRE) IMPLANT
GUIDEWIRE PFTE-COATED .018X300 (WIRE) IMPLANT
PACK ANGIOGRAPHY (CUSTOM PROCEDURE TRAY) ×1 IMPLANT
SHEATH ANL2 6FRX45 HC (SHEATH) IMPLANT
SHEATH BRITE TIP 5FRX11 (SHEATH) IMPLANT
STENT ESPRIT BTK 3.7X38 SCAFF (Permanent Stent) IMPLANT
STENT LIFESTENT 5F 7X120X135 (Permanent Stent) IMPLANT
SYR MEDRAD MARK 7 150ML (SYRINGE) IMPLANT
TUBING CONTRAST HIGH PRESS 72 (TUBING) IMPLANT
WIRE COMMAND ST ANG 014 300 (WIRE) IMPLANT
WIRE J 3MM .035X145CM (WIRE) IMPLANT

## 2023-06-13 NOTE — Progress Notes (Signed)
 PODIATRY PROGRESS NOTE Patient Name: Scott Hale  DOB 1950/05/27 DOA 06/07/2023  Hospital Day: 7  Assessment:  73 y.o. male with PMHx significant for  DM2, HTN, CKD  with evidence of gangrenous changes to the 3rd, 4th and 5th toes on the right foot   WBC 18.1 A1c -  7.5 ESR/CRP : ordered   MRI R foot:  Marrow sig abnormalisty base of 3rd and 4th prox phalanges and heads of third and 4th met compatible with OM/ septic arthritis. Also possible 5th met head OM.    ABI -  0.58 R, 0.86 L - Findings indicative of moderate right and mild left lower extremity peripheral arterial disease.  Plan:  - N.p.o. past midnight for OR tomorrow morning likely transmetatarsal amputation.  Patient aware and agrees to proceed - Appreciate vascular surgery, plan for right lower extremity angiography today, will follow-up results - Continue IV abx per infectious disease recommendations - Anticoagulation: Okay to continue per primary/ vascular recs - Wound care: Applied Betadine gauze in between the affected digits, change daily - WB status: Weightbearing as tolerated right foot  - Will continue to follow        Maridee Shoemaker, DPM [facility]    Subjective:  Discussed plan for possible transmetatarsal amputation of the right foot tomorrow.  Discussed given the extent of infection as well as gangrenous changes in his lateral forefoot he would likely need 3rd through 5th ray amputations which would need to be converted to a transmetatarsal amputation given the amount of tissue loss involved.  He is understanding of this.  We discussed plan for surgery tomorrow morning.  Will discuss further with Dr. Vonna Guardian after the vascular procedure today as well as Dr. Francee Inch.   Objective:   Vitals:   06/13/23 0727 06/13/23 0927  BP: 117/70 125/64  Pulse: 66 65  Resp: 16 18  Temp: 98.9 F (37.2 C) 98.6 F (37 C)  SpO2: 99% 96%       Latest Ref Rng & Units 06/13/2023    5:14 AM 06/12/2023     5:08 AM 06/11/2023    3:15 AM  CBC  WBC 4.0 - 10.5 K/uL 17.2  18.8  18.1   Hemoglobin 13.0 - 17.0 g/dL 16.1  09.6  04.5   Hematocrit 39.0 - 52.0 % 31.1  32.3  34.1   Platelets 150 - 400 K/uL 326  331  281        Latest Ref Rng & Units 06/12/2023    5:08 AM 06/11/2023    3:15 AM 06/10/2023    5:30 AM  BMP  Glucose 70 - 99 mg/dL 409  62  64   BUN 8 - 23 mg/dL 11  10  10    Creatinine 0.61 - 1.24 mg/dL 8.11  9.14  7.82   Sodium 135 - 145 mmol/L 134  131  136   Potassium 3.5 - 5.1 mmol/L 4.2  4.2  3.8   Chloride 98 - 111 mmol/L 99  96  101   CO2 22 - 32 mmol/L 25  25  26    Calcium  8.9 - 10.3 mg/dL 8.8  8.8  8.3     General: AAOx3, NAD  Lower Extremity Exam Vasc:     R - PT non palpable, DP non palpable.  Digits feel cool to touch               L - PT non palpable, DP non palpable.  Digits feel  cool to touch   Derm:    R -increased gangrenous changes of the 3rd through 5th digits of the right foot.  Superficial blistering noted to the distal forefoot dorsally.                    L - Normal temp/texture/turgor with no open lesion or clinical signs of infection / Wound:    MSK:     R - Edema of foot/ ankle               L -  No gross deformities. Compartments soft, non-tender, compressible   Neuro:R - Gross sensation diminished to digit level. Gross motor function intact                        L - Gross sensation diminished to digit level. Gross motor function intact   Radiology:  Results reviewed. See assessment for pertinent imaging results

## 2023-06-13 NOTE — Progress Notes (Signed)
 Dr. Vonna Guardian at bedside, speaking with pt. And his S.O. re: procedural results. Both persons verbalized understanding of conversation.

## 2023-06-13 NOTE — Progress Notes (Signed)
 PHARMACY - ANTICOAGULATION CONSULT NOTE  Pharmacy Consult for heparin  drip Indication: Severe PAD   No Known Allergies  Patient Measurements: Height: 6' (182.9 cm) Weight: 108 kg (238 lb) IBW/kg (Calculated) : 77.6 HEPARIN  DW (KG): 100.3  Vital Signs: Temp: 99.1 F (37.3 C) (05/01 0418) BP: 121/68 (05/01 0418) Pulse Rate: 67 (05/01 0418)  Labs: Recent Labs    06/11/23 0315 06/12/23 0508 06/12/23 1209 06/12/23 1836 06/13/23 0514  HGB 11.6* 11.0*  --   --  10.5*  HCT 34.1* 32.3*  --   --  31.1*  PLT 281 331  --   --  326  APTT  --   --  30  --   --   LABPROT  --   --  16.1*  --   --   INR  --   --  1.3*  --   --   HEPARINUNFRC  --   --   --  <0.10* 0.19*  CREATININE 1.27* 1.18  --   --   --     Estimated Creatinine Clearance: 74 mL/min (by C-G formula based on SCr of 1.18 mg/dL).   Medical History: Past Medical History:  Diagnosis Date   Arthritis    CHF (congestive heart failure) (HCC)    Diabetes mellitus without complication (HCC)    Erectile dysfunction    History of kidney stones    Hypertension    Sleep apnea     Medications:  Medications Prior to Admission  Medication Sig Dispense Refill Last Dose/Taking   acetaminophen  (TYLENOL ) 325 MG tablet Take 650 mg by mouth every 6 (six) hours as needed for mild pain, headache, fever or moderate pain.   Taking As Needed   ASPIRIN  81 PO Take by mouth daily.   Past Week   atorvastatin  (LIPITOR) 40 MG tablet TAKE 1 TABLET BY MOUTH EVERY DAY *NEW PRESCRIPTION REQUEST* 90 tablet 1 Past Week   carvedilol  (COREG ) 3.125 MG tablet TAKE ONE (1) TABLET BY MOUTH TWICE DAILY *NEW PRESCRIPTION REQUEST* 180 tablet 1 Past Week   cholecalciferol (VITAMIN D) 25 MCG (1000 UT) tablet Take 1,000 Units by mouth daily.   Past Week   dapagliflozin  propanediol (FARXIGA ) 10 MG TABS tablet TAKE 1 TABLET BY MOUTH EVERY DAY BEFORE BREAKFAST *NEW PRESCRIPTION REQUEST* 90 tablet 1 06/07/2023   Dulaglutide 1.5 MG/0.5ML SOPN Inject 1.5 mg into  the skin once a week. Mondays   Taking   furosemide  (LASIX ) 40 MG tablet TAKE 1 TABLET BY MOUTH EVERY DAY *NEW PRESCRIPTION REQUEST* 90 tablet 1 Past Week   glipiZIDE (GLUCOTROL) 10 MG tablet Take 10 mg by mouth daily before breakfast.   06/06/2023   insulin  glargine (LANTUS ) 100 UNIT/ML Solostar Pen Inject 40-80 Units into the skin at bedtime.   06/06/2023   Niacin, Antihyperlipidemic, 500 MG TABS Take 500 mg by mouth daily with breakfast.   Past Week   sacubitril -valsartan  (ENTRESTO ) 97-103 MG Take 1 tablet by mouth 2 (two) times daily. 180 tablet 1 06/07/2023 Morning   spironolactone  (ALDACTONE ) 25 MG tablet TAKE 1 TABLET BY MOUTH EVERY DAY *NEW PRESCRIPTION REQUEST* 90 tablet 1 Past Week   tadalafil (CIALIS) 5 MG tablet Take by mouth daily as needed for erectile dysfunction.   Taking As Needed   vitamin B-12 (CYANOCOBALAMIN) 500 MCG tablet Take 500 mcg by mouth daily.   06/07/2023    Assessment: 73 yo male to start heparin  drip for severe PAD with anticipated angiogram on 5/1 Hx: arthritis, CHF, diabetes mellitus  2, kidney stones, hypertension and sleep apnea  On Lovenox  50 mg q24h for DVT ppx with last dose 4/29@2123  30s   INR 1.3  Goal of Therapy:  Heparin  level 0.3-0.7 units/ml Monitor platelets by anticoagulation protocol: Yes   Plan: heparin  level subtherapeutic ---will give 3000 units bolus x 1 ---will increase heparin  infusion to 2350 units/hr ---Check anti-Xa level in 6 hours after rate change  ---Continue to monitor H&H and platelets  Coretta Dexter, PharmD, [facility] 06/13/2023 6:37 AM

## 2023-06-13 NOTE — TOC Progression Note (Signed)
 Transition of Care [facility]) - Progression Note    Patient Details  Name: Scott Hale MRN: [id number] Date of Birth: September 24, 1950  [facility] CM/SW Contact  Crayton Docker, RN Phone Number: 06/13/2023, 12:40 PM  Clinical Narrative:     CM to patient's room regarding discharge care planning. Patient is not in room. Angio pending per Dr. Mosetta Areola.   Expected Discharge Plan and Services    TBD     Social Determinants of Health (SDOH) Interventions SDOH Screenings   Food Insecurity: Food Insecurity Present (06/08/2023)  Housing: Low Risk  (06/08/2023)  Recent Concern: Housing - High Risk (05/27/2023)   Received from [facility]  Transportation Needs: No Transportation Needs (06/08/2023)  Utilities: At Risk (06/08/2023)  Depression (PHQ2-9): Low Risk  (09/25/2021)  Financial Resource Strain: Medium Risk (12/05/2021)   Received from [facility], [facility]  Physical Activity: Sufficiently Active (03/26/2017)  Social Connections: Moderately Isolated (06/08/2023)  Stress: No Stress Concern Present (03/26/2017)  Tobacco Use: Low Risk  (06/07/2023)    Readmission Risk Interventions     No data to display

## 2023-06-13 NOTE — Progress Notes (Signed)
 PT Cancellation Note  Patient Details Name: Scott Hale MRN: [id number] DOB: March 17, 1950   Cancelled Treatment:    Reason Eval/Treat Not Completed: Medical issues which prohibited therapy;Patient at procedure or test/unavailable (Pt OTF for vascular procedure today, will go for TMA next day. Please place new PT order when pt is medically ready to be evaluated.)   12:06 PM, 06/13/23 Dawn Eth, PT, DPT Physical Therapist - [facility]  [phone] (ASCOM)    Cherylyn Sundby C 06/13/2023, 12:05 PM

## 2023-06-13 NOTE — Op Note (Signed)
 [facility]  Percutaneous Study/Intervention Procedural Note   Date of Surgery: 06/13/2023  Surgeon(s):Dysen Edmondson    Assistants:none  Pre-operative Diagnosis: PAD with ulceration right foot  Post-operative diagnosis:  Same  Procedure(s) Performed:             1.  Ultrasound guidance for vascular access left femoral artery             2.  Catheter placement into right common femoral artery from left femoral approach             3.  Aortogram and selective right lower extremity angiogram             4.  Percutaneous transluminal angioplasty of right anterior tibial artery with 3 mm diameter angioplasty balloon throughout and 3.5 mm diameter angioplasty balloon proximally             5.  Stent placement to the proximal right anterior tibial artery with 3.75 mm diameter by 38 mm length Esprit scaffolding system  6.  Angioplasty of the right popliteal artery and distal SFA with 5 mm diameter by 30 cm length Lutonix drug-coated angioplasty balloon  7.  Stent placement to the distal right SFA and most proximal popliteal artery with 7 mm diameter by 12 cm length life stent  8.  Angioplasty of the origin of the right SFA with 5 mm diameter by 6 cm length Lutonix drug-coated angioplasty balloon             9.  StarClose closure device left femoral artery  EBL: 10 cc  Contrast: 55 cc  Fluoro Time: 7.7 minutes  Moderate Conscious Sedation Time: approximately 47 minutes using 3 mg of Versed  and 75 mcg of Fentanyl               Indications:  Patient is a 73 y.o.male with nonhealing ulceration and infection of the right foot with poorly palpable pedal pulses.  The patient is brought in for angiography for further evaluation and potential treatment.  Due to the limb threatening nature of the situation, angiogram was performed for attempted limb salvage. The patient is aware that if the procedure fails, amputation would be expected.  The patient also understands that even with  successful revascularization, amputation may still be required due to the severity of the situation.  Risks and benefits are discussed and informed consent is obtained.   Procedure:  The patient was identified and appropriate procedural time out was performed.  The patient was then placed supine on the table and prepped and draped in the usual sterile fashion. Moderate conscious sedation was administered during a face to face encounter with the patient throughout the procedure with my supervision of the RN administering medicines and monitoring the patient's vital signs, pulse oximetry, telemetry and mental status throughout from the start of the procedure until the patient was taken to the recovery room. Ultrasound was used to evaluate the left common femoral artery.  It was patent .  A digital ultrasound image was acquired.  A Seldinger needle was used to access the left common femoral artery under direct ultrasound guidance and a permanent image was performed.  A 0.035 J wire was advanced without resistance and a 5Fr sheath was placed.  Pigtail catheter was placed into the aorta and an AP aortogram was performed. This demonstrated normal renal arteries and normal aorta and iliac segments with some mild disease proximally but without significant stenosis. I then crossed the aortic bifurcation and advanced  to the right femoral head. Selective right lower extremity angiogram was then performed. This demonstrated stenosis in the distal common femoral artery and the origin of the superficial femoral artery in the 60 to 70% range.  The vessel then normalized and then the distal SFA and above-knee popliteal artery was a high-grade stenosis in the 80 to 90% range.  We had to frog-leg his knee to see behind his knee prosthesis but there was also about a 70% stenosis in the mid popliteal artery.  The below-knee popliteal artery normalized but there was then severe tibial disease at the proximal portions of the anterior  tibial artery and tibioperoneal trunk with greater than 90% stenosis.  The anterior tibial artery was dominant runoff into the foot but had an occlusion in the proximal to mid segment.  The posterior tibial artery was large but did not continue to the foot.  The peroneal artery was a smaller vessel and had some collateral flow into the foot although somewhat sluggish.  Revascularization of the anterior tibial artery would be the most important vessel. It was felt that it was in the patient's best interest to proceed with intervention after these images to avoid a second procedure and a larger amount of contrast and fluoroscopy based off of the findings from the initial angiogram. The patient was systemically heparinized and a 6 [place] Ansell sheath was then placed over the [employer] wire. I then used a Kumpe catheter and the advantage wire to navigate through the SFA and popliteal lesions and down into the proximal anterior tibial artery.  I then exchanged for a 135 cm catheter and a 0.018 advantage wire was able to easily cross the anterior tibial artery occlusion without difficulty parking the wire in the foot.  A long 3 mm diameter by 30 cm length angioplasty balloon was inflated up to 8 atm for 1 minute from the distal anterior tibial artery up to the origin.  Completion imaging showed marked improvement although there remained greater than 65% residual stenosis at the origin of the anterior tibial artery.  Attempts at treating this with a 3.5 mm diameter angioplasty balloon left residual stenosis and I elected to place a stent there.  We exchanged for a 0.014 wire and selected a 3.75 mm diameter by 38 mm length Esprit scaffolding system.  This was deployed in the proximal anterior tibial artery and inflated up to 12 atm and then was postdilated with a 4 mm balloon with excellent angiographic completion result and less than 10% residual stenosis.  We then replaced the 0.018 wire and treated the distal SFA  and popliteal lesions with a 5 mm diameter by 30 cm length Lutonix drug-coated angioplasty balloon inflated to 12 atm for 1 minute.  Completion imaging showed the more distal lesion to have only about a 25% residual stenosis but there remained greater than 50% residual stenosis at Hunter's canal.  A 7 mm diameter by 12 cm length life stent was then selected and deployed in the right distal SFA and above-knee popliteal artery and then postdilated with a 6 mm balloon with excellent angiographic completion result and only about 10% residual stenosis.  The lesion in the distal common femoral artery and the origin of the superficial femoral artery was then addressed with a 5 mm diameter by 6 cm length Lutonix drug-coated angioplasty balloon inflated to 12 atm for 1 minute.  Completion imaging still showed about a 40 to 50% residual stenosis, but this was in an area where stent  placement was not appropriate and if he has residual malperfusion femoral endarterectomy will need to be considered although I suspect his perfusion will be markedly improved after the other interventions. I elected to terminate the procedure. The sheath was removed and StarClose closure device was deployed in the left femoral artery with excellent hemostatic result. The patient was taken to the recovery room in stable condition having tolerated the procedure well.  Findings:               Aortogram:  This demonstrated normal renal arteries and normal aorta and iliac segments with some mild disease proximally but without significant stenosis.             Right lower Extremity:  This demonstrated stenosis in the distal common femoral artery and the origin of the superficial femoral artery in the 60 to 70% range.  The vessel then normalized and then the distal SFA and above-knee popliteal artery was a high-grade stenosis in the 80 to 90% range.  We had to frog-leg his knee to see behind his knee prosthesis but there was also about a 70% stenosis  in the mid popliteal artery.  The below-knee popliteal artery normalized but there was then severe tibial disease at the proximal portions of the anterior tibial artery and tibioperoneal trunk with greater than 90% stenosis.  The anterior tibial artery was dominant runoff into the foot but had an occlusion in the proximal to mid segment.  The posterior tibial artery was large but did not continue to the foot.  The peroneal artery was a smaller vessel and had some collateral flow into the foot although somewhat sluggish.  Revascularization of the anterior tibial artery would be the most important vessel.    Disposition: Patient was taken to the recovery room in stable condition having tolerated the procedure well.  Complications: None  Mikki Alexander 06/13/2023 1:06 PM   This note was created with Dragon Medical transcription system. Any errors in dictation are purely unintentional.

## 2023-06-13 NOTE — Plan of Care (Signed)
   Problem: Coping: Goal: Ability to adjust to condition or change in health will improve Outcome: Progressing

## 2023-06-13 NOTE — Progress Notes (Signed)
 PROGRESS NOTE    Scott Hale  WUJ:[id number] DOB: [birth year] DOA: 06/07/2023 PCP: Lyle San, MD    Brief Narrative:   Scott Hale is a 72 y.o. male with medical history significant of DM2, HTN, CKD who presented with right foot pain.    Assessment & Plan:   Principal Problem:   Cellulitis Active Problems:   Gangrene of toe of right foot (HCC)   PAD (peripheral artery disease) (HCC)   Cellulitis of right foot   Diabetic foot infection (HCC)  RLE cellulitis  Possible osteomyelitis and septic arthritis --received vanc/cefe/flagyl  in the ED, then switched to Ancef .  No open wounds on presentation, but wounds opened up in between toes on 4/28.  ID and podiatry consulted.  Abx switched to abx to Unasyn  and Zyvox , per ID. --MRI right foot showed multiple areas of osteomyelitis Plan: Continue IV antibiotics per ID recommendation WBAT right foot, postop shoe ordered Local wound care Vascular angio today Hep gtt for now Likely podiatry will take for resection 5/2 NPO after 12  PAD --ABI with Findings indicative of moderate right and mild left lower extremity peripheral arterial disease.  --Vascular surgery consulted  Plan: Hep gtt Angio today  Severe Sepsis --fever, leukocytosis, with AKI, source cellulitis   DM2 --recent A1c 7.6.  Pt reported taking Levemir 80u nightly at home. --Continue glargine to 45u nightly --ACHS and SSI   HTN --resume coreg    AKI on CKD 3a --Cr 1.85 on presentation, improved to 1.37 with IVF. --oral hydration now   Hyponatremia --of unclear significance   DVT prophylaxis: hep gtt Code Status: Full Family Communication: Friend at bedside 4/30 Disposition Plan: Status is: Inpatient Remains inpatient appropriate because: Lower extremity osteomyelitis   Level of care: Med-Surg  Consultants:  ID Podiatry Vascular surgery  Procedures:  Lower extremity angiogram 5/1  Antimicrobials: Unasyn  Zyvox    Subjective: Seen and  examined.  Resting in bed.  No distress.  Objective: Vitals:   06/13/23 1300 06/13/23 1315 06/13/23 1330 06/13/23 1345  BP: 126/62 (!) 154/64 131/63 138/66  Pulse: (!) 56 (!) 55 64 63  Resp: 14 18 17 11   Temp:      TempSrc:      SpO2: 97% 99% 98% 100%  Weight:      Height:        Intake/Output Summary (Last 24 hours) at 06/13/2023 1459 Last data filed at 06/13/2023 9562 Gross per 24 hour  Intake 1418.18 ml  Output 2050 ml  Net -631.82 ml   Filed Weights   06/07/23 1453 06/13/23 0927  Weight: 108 kg 108 kg    Examination:  General exam: NAD Respiratory system: Lungs clear, normal WOB, RA Cardiovascular system: S1-S2, RRR, no murmurs, no pedal edema Gastrointestinal system: Soft, NT/ND, normal bowel Central nervous system: Alert and oriented. No focal neurological deficits. Extremities: Bilateral lower extremities with chronic changes.  Toes macerated.  Black in color Skin: No rashes, lesions or ulcers Psychiatry: Judgement and insight appear normal. Mood & affect appropriate.     Data Reviewed: I have personally reviewed following labs and imaging studies  CBC: Recent Labs  Lab 06/07/23 1653 06/08/23 0440 06/09/23 0454 06/10/23 0530 06/11/23 0315 06/12/23 0508 06/13/23 0514  WBC 15.6*   < > 15.8* 16.4* 18.1* 18.8* 17.2*  NEUTROABS 12.6*  --   --   --   --   --   --   HGB 13.2   < > 11.8* 11.4* 11.6* 11.0* 10.5*  HCT 38.9*   < >  34.5* 32.9* 34.1* 32.3* 31.1*  MCV 90.5   < > 88.7 87.5 89.3 89.0 90.4  PLT 255   < > 253 283 281 331 326   < > = values in this interval not displayed.   Basic Metabolic Panel: Recent Labs  Lab 06/08/23 0440 06/09/23 0454 06/10/23 0530 06/11/23 0315 06/12/23 0508  NA 131* 131* 136 131* 134*  K 3.5 3.6 3.8 4.2 4.2  CL 103 99 101 96* 99  CO2 24 23 26 25 25   GLUCOSE 132* 111* 64* 62* 100*  BUN 13 12 10 10 11   CREATININE 1.62* 1.37* 1.34* 1.27* 1.18  CALCIUM  8.1* 8.4* 8.3* 8.8* 8.8*  MG 2.2  --   --   --   --     GFR: Estimated Creatinine Clearance: 74 mL/min (by C-G formula based on SCr of 1.18 mg/dL). Liver Function Tests: No results for input(s): "AST", "ALT", "ALKPHOS", "BILITOT", "PROT", "ALBUMIN" in the last 168 hours. No results for input(s): "LIPASE", "AMYLASE" in the last 168 hours. No results for input(s): "AMMONIA" in the last 168 hours. Coagulation Profile: Recent Labs  Lab 06/12/23 1209  INR 1.3*   Cardiac Enzymes: No results for input(s): "CKTOTAL", "CKMB", "CKMBINDEX", "TROPONINI" in the last 168 hours. BNP (last 3 results) No results for input(s): "PROBNP" in the last 8760 hours. HbA1C: No results for input(s): "HGBA1C" in the last 72 hours.  CBG: Recent Labs  Lab 06/12/23 2051 06/13/23 0724 06/13/23 0937 06/13/23 1144 06/13/23 1255  GLUCAP 148* 203* 134* 125* 112*   Lipid Profile: No results for input(s): "CHOL", "HDL", "LDLCALC", "TRIG", "CHOLHDL", "LDLDIRECT" in the last 72 hours. Thyroid Function Tests: No results for input(s): "TSH", "T4TOTAL", "FREET4", "T3FREE", "THYROIDAB" in the last 72 hours. Anemia Panel: No results for input(s): "VITAMINB12", "FOLATE", "FERRITIN", "TIBC", "IRON", "RETICCTPCT" in the last 72 hours. Sepsis Labs: Recent Labs  Lab 06/07/23 1725  LATICACIDVEN 1.5    Recent Results (from the past 240 hours)  Resp panel by RT-PCR (RSV, Flu A&B, Covid) Anterior Nasal Swab     Status: None   Collection Time: 06/07/23  4:53 PM   Specimen: Anterior Nasal Swab  Result Value Ref Range Status   SARS Coronavirus 2 by RT PCR NEGATIVE NEGATIVE Final    Comment: (NOTE) SARS-CoV-2 target nucleic acids are NOT DETECTED.  The SARS-CoV-2 RNA is generally detectable in upper respiratory specimens during the acute phase of infection. The lowest concentration of SARS-CoV-2 viral copies this assay can detect is 138 copies/mL. A negative result does not preclude SARS-Cov-2 infection and should not be used as the sole basis for treatment or other  patient management decisions. A negative result may occur with  improper specimen collection/handling, submission of specimen other than nasopharyngeal swab, presence of viral mutation(s) within the areas targeted by this assay, and inadequate number of viral copies(<138 copies/mL). A negative result must be combined with clinical observations, patient history, and epidemiological information. The expected result is Negative.  Fact Sheet for Patients:  BloggerCourse.com  Fact Sheet for Healthcare Providers:  SeriousBroker.it  This test is no t yet approved or cleared by the [place]  FDA and  has been authorized for detection and/or diagnosis of SARS-CoV-2 by FDA under an Emergency Use Authorization (EUA). This EUA will remain  in effect (meaning this test can be used) for the duration of the COVID-19 declaration under Section 564(b)(1) of the Act, 21 U.S.C.section 360bbb-3(b)(1), unless the authorization is terminated  or revoked sooner.  Influenza A by PCR NEGATIVE NEGATIVE Final   Influenza B by PCR NEGATIVE NEGATIVE Final    Comment: (NOTE) The Xpert Xpress SARS-CoV-2/FLU/RSV plus assay is intended as an aid in the diagnosis of influenza from Nasopharyngeal swab specimens and should not be used as a sole basis for treatment. Nasal washings and aspirates are unacceptable for Xpert Xpress SARS-CoV-2/FLU/RSV testing.  Fact Sheet for Patients: BloggerCourse.com  Fact Sheet for Healthcare Providers: SeriousBroker.it  This test is not yet approved or cleared by the [place]  FDA and has been authorized for detection and/or diagnosis of SARS-CoV-2 by FDA under an Emergency Use Authorization (EUA). This EUA will remain in effect (meaning this test can be used) for the duration of the COVID-19 declaration under Section 564(b)(1) of the Act, 21 U.S.C. section  360bbb-3(b)(1), unless the authorization is terminated or revoked.     Resp Syncytial Virus by PCR NEGATIVE NEGATIVE Final    Comment: (NOTE) Fact Sheet for Patients: BloggerCourse.com  Fact Sheet for Healthcare Providers: SeriousBroker.it  This test is not yet approved or cleared by the [place]  FDA and has been authorized for detection and/or diagnosis of SARS-CoV-2 by FDA under an Emergency Use Authorization (EUA). This EUA will remain in effect (meaning this test can be used) for the duration of the COVID-19 declaration under Section 564(b)(1) of the Act, 21 U.S.C. section 360bbb-3(b)(1), unless the authorization is terminated or revoked.  Performed at [facility], [address] [place], [place] [address]   Culture, blood (routine x 2)     Status: None   Collection Time: 06/07/23  5:25 PM   Specimen: BLOOD  Result Value Ref Range Status   Specimen Description BLOOD RIGHT ANTECUBITAL  Final   Special Requests   Final    BOTTLES DRAWN AEROBIC AND ANAEROBIC Blood Culture results may not be optimal due to an inadequate volume of blood received in culture bottles   Culture   Final    NO GROWTH 5 DAYS Performed at [facility], [address] [place], [place] [address]    Report Status 06/12/2023 FINAL  Final  Culture, blood (routine x 2)     Status: None   Collection Time: 06/07/23  5:27 PM   Specimen: BLOOD  Result Value Ref Range Status   Specimen Description BLOOD LEFT ANTECUBITAL  Final   Special Requests   Final    BOTTLES DRAWN AEROBIC AND ANAEROBIC Blood Culture results may not be optimal due to an inadequate volume of blood received in culture bottles   Culture   Final    NO GROWTH 5 DAYS Performed at [facility], [address] [place], [place] [address]    Report Status 06/12/2023 FINAL  Final  Aerobic Culture w Gram Stain (superficial specimen)     Status: None    Collection Time: 06/10/23  7:35 PM   Specimen: Wound  Result Value Ref Range Status   Specimen Description   Final    WOUND Performed at [facility], [address], [place], [place] [address]    Special Requests   Final    RIGHT FOOT Performed at [facility], [address] [place], [place] [address]    Gram Stain NO WBC SEEN FEW GRAM POSITIVE COCCI IN PAIRS   Final   Culture   Final    RARE GROUP B STREP(S.AGALACTIAE)ISOLATED TESTING AGAINST S. AGALACTIAE NOT ROUTINELY PERFORMED DUE TO PREDICTABILITY OF AMP/PEN/VAN SUSCEPTIBILITY. WITHIN MIXED NORMAL SKIN FLORA Performed at [facility]  [facility], [address], [place], [place] [address]    Report Status 06/13/2023 FINAL  Final         Radiology Studies: PERIPHERAL VASCULAR CATHETERIZATION Result Date: 06/13/2023 See surgical note for result.       Scheduled Meds:  [MAR Hold] aspirin   81 mg Oral Daily   [MAR Hold] atorvastatin   40 mg Oral Daily   [MAR Hold] carvedilol   3.125 mg Oral BID WC   clopidogrel   75 mg Oral Daily   [MAR Hold] furosemide   40 mg Oral Daily   [MAR Hold] insulin  aspart  0-9 Units Subcutaneous TID WC   [MAR Hold] insulin  glargine-yfgn  45 Units Subcutaneous QHS   [MAR Hold] sacubitril -valsartan   1 tablet Oral BID   [MAR Hold] spironolactone   25 mg Oral Daily   Continuous Infusions:  heparin  2,350 Units/hr (06/13/23 0749)   [MAR Hold] linezolid  (ZYVOX ) IV Stopped (06/12/23 2224)   [MAR Hold] piperacillin -tazobactam (ZOSYN )  IV 12.5 mL/hr at 06/13/23 0613     LOS: 5 days    Tiajuana Fluke, MD [facility]   If 7PM-7AM, please contact night-coverage  06/13/2023, 2:59 PM

## 2023-06-13 NOTE — Interval H&P Note (Signed)
 History and Physical Interval Note:  06/13/2023 10:45 AM  Scott Hale  has presented today for surgery, with the diagnosis of PAD.  The various methods of treatment have been discussed with the patient and family. After consideration of risks, benefits and other options for treatment, the patient has consented to  Procedure(s): Lower Extremity Angiography (Right) as a surgical intervention.  The patient's history has been reviewed, patient examined, no change in status, stable for surgery.  I have reviewed the patient's chart and labs.  Questions were answered to the patient's satisfaction.     [name]

## 2023-06-13 NOTE — Progress Notes (Signed)
 Date of Admission:  06/07/2023     ID: Scott Hale is a 73 y.o. male  Principal Problem:   Cellulitis Active Problems:   Gangrene of toe of right foot (HCC)   PAD (peripheral artery disease) (HCC)   Cellulitis of right foot   Diabetic foot infection (HCC)    Subjective: Swelling less He does not feel hot or has chills  Medications:   atorvastatin   40 mg Oral Daily   carvedilol   3.125 mg Oral BID WC   enoxaparin  (LOVENOX ) injection  0.5 mg/kg Subcutaneous Q24H   furosemide   40 mg Oral Daily   insulin  aspart  0-9 Units Subcutaneous TID WC   insulin  glargine-yfgn  45 Units Subcutaneous QHS   sacubitril -valsartan   1 tablet Oral BID   spironolactone   25 mg Oral Daily    Objective: Vital signs in last 24 hours: Patient Vitals for the past 24 hrs:  BP Temp Temp src Pulse Resp SpO2 Height Weight  06/13/23 1929 (!) 142/73 (!) 101.3 F (38.5 C) Oral 83 18 100 % -- --  06/13/23 1520 128/72 97.7 F (36.5 C) Oral 69 17 99 % -- --  06/13/23 1345 138/66 -- -- 63 11 100 % -- --  06/13/23 1330 131/63 -- -- 64 17 98 % -- --  06/13/23 1315 (!) 154/64 -- -- (!) 55 18 99 % -- --  06/13/23 1300 126/62 -- -- (!) 56 14 97 % -- --  06/13/23 1247 128/63 -- -- (!) 0 -- -- -- --  06/13/23 1245 128/63 -- -- 62 -- 99 % -- --  06/13/23 1243 122/65 -- -- 62 -- 99 % -- --  06/13/23 1241 131/63 -- -- (!) 58 -- 99 % -- --  06/13/23 1239 128/63 -- -- (!) 59 -- 100 % -- --  06/13/23 1237 126/63 -- -- (!) 59 -- 99 % -- --  06/13/23 1235 135/63 -- -- 66 -- 99 % -- --  06/13/23 1233 135/71 -- -- 63 -- 100 % -- --  06/13/23 1231 129/69 -- -- 63 -- 100 % -- --  06/13/23 1229 133/68 -- -- 63 -- 100 % -- --  06/13/23 1227 131/62 -- -- 60 -- 99 % -- --  06/13/23 1226 -- -- -- -- -- 100 % -- --  06/13/23 1225 126/61 -- -- 62 -- (!) 85 % -- --  06/13/23 1224 -- -- -- -- -- 96 % -- --  06/13/23 1223 118/62 -- -- 61 -- (!) 83 % -- --  06/13/23 1221 123/61 -- -- (!) 58 -- 97 % -- --  06/13/23 1219 125/66 --  -- 61 -- 100 % -- --  06/13/23 1217 121/62 -- -- 61 -- 100 % -- --  06/13/23 1215 115/68 -- -- 68 -- 100 % -- --  06/13/23 1213 (!) 117/57 -- -- 60 -- 100 % -- --  06/13/23 1211 (!) 114/57 -- -- (!) 59 -- 99 % -- --  06/13/23 1209 (!) 117/58 -- -- 61 -- 99 % -- --  06/13/23 1207 (!) 120/56 -- -- 68 -- 99 % -- --  06/13/23 1205 (!) 119/59 -- -- 61 -- 98 % -- --  06/13/23 1203 (!) 121/57 -- -- 61 -- 98 % -- --  06/13/23 1201 124/61 -- -- 61 -- 99 % -- --  06/13/23 1159 129/63 -- -- 60 -- 100 % -- --  06/13/23 1157 128/62 -- -- 60 -- 100 % -- --  06/13/23 1156 -- -- -- -- -- 100 % -- --  06/13/23 1155 128/66 -- -- 61 -- 100 % -- --  06/13/23 1153 125/82 -- -- 65 -- 100 % -- --  06/13/23 1153 125/82 -- -- 65 -- 100 % -- --  06/13/23 1151 -- -- -- 63 -- 100 % -- --  06/13/23 0927 125/64 98.6 F (37 C) Oral 65 18 96 % 6' (1.829 m) 108 kg  06/13/23 0727 117/70 98.9 F (37.2 C) -- 66 16 99 % -- --  06/13/23 0418 121/68 99.1 F (37.3 C) -- 67 18 97 % -- --       PHYSICAL EXAM:  General: Alert, cooperative, no distress, appears stated age.  Lungs: Clear to auscultation bilaterally. No Wheezing or Rhonchi. No rales. Heart: Regular rate and rhythm, no murmur, rub or gallop. Abdomen: Soft, non-tender,not distended. Bowel sounds normal. No masses Extremities: Gangrene 3/4 toes rt foot           Skin: No rashes or lesions. Or bruising Lymph: Cervical, supraclavicular normal. Neurologic: Grossly non-focal  Lab Results    Latest Ref Rng & Units 06/13/2023    5:14 AM 06/12/2023    5:08 AM 06/11/2023    3:15 AM  CBC  WBC 4.0 - 10.5 K/uL 17.2  18.8  18.1   Hemoglobin 13.0 - 17.0 g/dL 40.9  81.1  91.4   Hematocrit 39.0 - 52.0 % 31.1  32.3  34.1   Platelets 150 - 400 K/uL 326  331  281        Latest Ref Rng & Units 06/12/2023    5:08 AM 06/11/2023    3:15 AM 06/10/2023    5:30 AM  CMP  Glucose 70 - 99 mg/dL 782  62  64   BUN 8 - 23 mg/dL 11  10  10    Creatinine 0.61 - 1.24  mg/dL 9.56  2.13  0.86   Sodium 135 - 145 mmol/L 134  131  136   Potassium 3.5 - 5.1 mmol/L 4.2  4.2  3.8   Chloride 98 - 111 mmol/L 99  96  101   CO2 22 - 32 mmol/L 25  25  26    Calcium  8.9 - 10.3 mg/dL 8.8  8.8  8.3         Studies/Results: PERIPHERAL VASCULAR CATHETERIZATION Result Date: 06/13/2023 See surgical note for result.    Assessment/Plan: 73 yr male with DM presents with pain rt foot of 2 weeks duration   Rt foot infection- diabetic/PAD/ neuropathy Rt foot swelling and erythema and tenderness with interdigital cleft maceration between 3/4 th toe with discharge 4th toe discolored  soft tissue infection of the rt foot now with gangrene of 3/4 toes - MRI shows non viable [place] tissue extending from the interdital cleft 3/4 along the dorsum Wbc going up due to ischemia. Non viable tissue Pt is going for amputation of toes VS TMA On zosyn  and linezolid    PAD- underwent angio and had angioplasty and stent placement of rt ATA, SFA, popliteal  DM- no recent A1c   Htn   CAD s/p CABG   RT TKA) no evidence of PJI ? Discussed the management with the patient and podiatrist

## 2023-06-14 ENCOUNTER — Encounter
Admission: EM | Disposition: A | Discharge status: Home Health | Payer: Self-pay | Source: Home / Self Care | Attending: Internal Medicine

## 2023-06-14 ENCOUNTER — Inpatient Hospital Stay: Admitting: Certified Registered"

## 2023-06-14 ENCOUNTER — Inpatient Hospital Stay

## 2023-06-14 ENCOUNTER — Encounter: Payer: Self-pay | Admitting: Vascular Surgery

## 2023-06-14 DIAGNOSIS — I96 Gangrene, not elsewhere classified: Secondary | ICD-10-CM | POA: Diagnosis not present

## 2023-06-14 DIAGNOSIS — M7989 Other specified soft tissue disorders: Secondary | ICD-10-CM | POA: Diagnosis not present

## 2023-06-14 DIAGNOSIS — E11628 Type 2 diabetes mellitus with other skin complications: Secondary | ICD-10-CM | POA: Diagnosis not present

## 2023-06-14 DIAGNOSIS — L089 Local infection of the skin and subcutaneous tissue, unspecified: Secondary | ICD-10-CM | POA: Diagnosis not present

## 2023-06-14 DIAGNOSIS — L539 Erythematous condition, unspecified: Secondary | ICD-10-CM | POA: Diagnosis not present

## 2023-06-14 DIAGNOSIS — L03115 Cellulitis of right lower limb: Secondary | ICD-10-CM | POA: Diagnosis not present

## 2023-06-14 HISTORY — PX: TRANSMETATARSAL AMPUTATION: SHX6197

## 2023-06-14 LAB — GLUCOSE, RANDOM: Glucose, Bld: 442 mg/dL — ABNORMAL HIGH (ref 70–99)

## 2023-06-14 LAB — GLUCOSE, CAPILLARY
Glucose-Capillary: 122 mg/dL — ABNORMAL HIGH (ref 70–99)
Glucose-Capillary: 119 mg/dL — ABNORMAL HIGH (ref 70–99)
Glucose-Capillary: 261 mg/dL — ABNORMAL HIGH (ref 70–99)
Glucose-Capillary: 341 mg/dL — ABNORMAL HIGH (ref 70–99)
Glucose-Capillary: 424 mg/dL — ABNORMAL HIGH (ref 70–99)

## 2023-06-14 LAB — HEPARIN LEVEL (UNFRACTIONATED): Heparin Unfractionated: 0.33 [IU]/mL (ref 0.30–0.70)

## 2023-06-14 SURGERY — AMPUTATION, FOOT, TRANSMETATARSAL
Anesthesia: General | Site: Toe | Laterality: Right

## 2023-06-14 MED ORDER — FENTANYL CITRATE (PF) 100 MCG/2ML IJ SOLN
INTRAMUSCULAR | Status: DC | PRN
  Administered 2023-06-14: 50 ug via INTRAVENOUS

## 2023-06-14 MED ORDER — HEPARIN (PORCINE) 25000 UT/250ML-% IV SOLN
2350.0000 [IU]/h | INTRAVENOUS | Status: DC
  Administered 2023-06-14 – 2023-06-15 (×4): 2350 [IU]/h via INTRAVENOUS
  Filled 2023-06-14 (×4): qty 250

## 2023-06-14 MED ORDER — FENTANYL CITRATE (PF) 100 MCG/2ML IJ SOLN: 25.0000 ug | INTRAMUSCULAR | Status: DC | PRN

## 2023-06-14 MED ORDER — PROPOFOL 1000 MG/100ML IV EMUL
INTRAVENOUS | Status: AC
  Filled 2023-06-14: qty 100

## 2023-06-14 MED ORDER — ONDANSETRON HCL 4 MG/2ML IJ SOLN
INTRAMUSCULAR | Status: DC | PRN
  Administered 2023-06-14: 4 mg via INTRAVENOUS

## 2023-06-14 MED ORDER — INSULIN ASPART 100 UNIT/ML IJ SOLN
10.0000 [IU] | Freq: Once | INTRAMUSCULAR | Status: AC
  Administered 2023-06-14: 10 [IU] via SUBCUTANEOUS
  Filled 2023-06-14: qty 1

## 2023-06-14 MED ORDER — 0.9 % SODIUM CHLORIDE (POUR BTL) OPTIME
TOPICAL | Status: DC | PRN
Start: 2023-06-14 — End: 2023-06-14
  Administered 2023-06-14: 500 mL

## 2023-06-14 MED ORDER — EPHEDRINE SULFATE-NACL 50-0.9 MG/10ML-% IV SOSY
PREFILLED_SYRINGE | INTRAVENOUS | Status: DC | PRN
  Administered 2023-06-14: 10 mg via INTRAVENOUS
  Administered 2023-06-14: 5 mg via INTRAVENOUS
  Administered 2023-06-14: 10 mg via INTRAVENOUS

## 2023-06-14 MED ORDER — DEXAMETHASONE SODIUM PHOSPHATE 10 MG/ML IJ SOLN
INTRAMUSCULAR | Status: AC
  Filled 2023-06-14: qty 1

## 2023-06-14 MED ORDER — VANCOMYCIN HCL 1000 MG IV SOLR
INTRAVENOUS | Status: AC
  Filled 2023-06-14: qty 20

## 2023-06-14 MED ORDER — MIDAZOLAM HCL 2 MG/2ML IJ SOLN
INTRAMUSCULAR | Status: DC | PRN
  Administered 2023-06-14: 2 mg via INTRAVENOUS

## 2023-06-14 MED ORDER — OXYCODONE HCL 5 MG PO TABS: 5.0000 mg | ORAL_TABLET | Freq: Once | ORAL | Status: DC | PRN

## 2023-06-14 MED ORDER — PROPOFOL 10 MG/ML IV BOLUS
INTRAVENOUS | Status: DC | PRN
  Administered 2023-06-14: 200 mg via INTRAVENOUS

## 2023-06-14 MED ORDER — MIDAZOLAM HCL 2 MG/2ML IJ SOLN
INTRAMUSCULAR | Status: AC
  Filled 2023-06-14: qty 2

## 2023-06-14 MED ORDER — LIDOCAINE HCL (CARDIAC) PF 100 MG/5ML IV SOSY
PREFILLED_SYRINGE | INTRAVENOUS | Status: DC | PRN
  Administered 2023-06-14: 60 mg via INTRAVENOUS

## 2023-06-14 MED ORDER — OXYCODONE HCL 5 MG/5ML PO SOLN: 5.0000 mg | Freq: Once | ORAL | Status: DC | PRN

## 2023-06-14 MED ORDER — PHENYLEPHRINE 80 MCG/ML (10ML) SYRINGE FOR IV PUSH (FOR BLOOD PRESSURE SUPPORT)
PREFILLED_SYRINGE | INTRAVENOUS | Status: DC | PRN
  Administered 2023-06-14 (×3): 80 ug via INTRAVENOUS

## 2023-06-14 MED ORDER — FENTANYL CITRATE (PF) 100 MCG/2ML IJ SOLN
INTRAMUSCULAR | Status: AC
  Filled 2023-06-14: qty 2

## 2023-06-14 MED ORDER — BUPIVACAINE HCL (PF) 0.5 % IJ SOLN
INTRAMUSCULAR | Status: AC
Start: 2023-06-14 — End: ?
  Filled 2023-06-14: qty 30

## 2023-06-14 MED ORDER — LACTATED RINGERS IV SOLN: INTRAVENOUS | Status: DC | PRN

## 2023-06-14 MED ORDER — LIDOCAINE HCL (PF) 1 % IJ SOLN
INTRAMUSCULAR | Status: AC
  Filled 2023-06-14: qty 30

## 2023-06-14 MED ORDER — SODIUM CHLORIDE 0.9 % IR SOLN
Status: DC | PRN
  Administered 2023-06-14: 1000 mL

## 2023-06-14 MED ORDER — VANCOMYCIN HCL 1000 MG IV SOLR
INTRAVENOUS | Status: DC | PRN
  Administered 2023-06-14: 1 g via TOPICAL

## 2023-06-14 MED ORDER — DEXAMETHASONE SODIUM PHOSPHATE 10 MG/ML IJ SOLN
INTRAMUSCULAR | Status: DC | PRN
  Administered 2023-06-14: 5 mg via INTRAVENOUS

## 2023-06-14 MED ORDER — LIDOCAINE HCL (PF) 1 % IJ SOLN
INTRAMUSCULAR | Status: DC | PRN
  Administered 2023-06-14: 20 mL via INTRAMUSCULAR

## 2023-06-14 MED ORDER — ONDANSETRON HCL 4 MG/2ML IJ SOLN
INTRAMUSCULAR | Status: AC
  Filled 2023-06-14: qty 2

## 2023-06-14 SURGICAL SUPPLY — 55 items
BAG COUNTER SPONGE SURGICOUNT (BAG) IMPLANT
BLADE OSC/SAGITTAL MD 9X18.5 (BLADE) ×1 IMPLANT
BLADE SURG 15 STRL LF DISP TIS (BLADE) ×2 IMPLANT
BLADE SURG SZ10 CARB STEEL (BLADE) IMPLANT
BLADE SW THK.38XMED LNG THN (BLADE) ×1 IMPLANT
BNDG COHESIVE 4X5 TAN STRL LF (GAUZE/BANDAGES/DRESSINGS) ×1 IMPLANT
BNDG ELASTIC 4INX 5YD STR LF (GAUZE/BANDAGES/DRESSINGS) ×1 IMPLANT
BNDG ELASTIC 6INX 5YD STR LF (GAUZE/BANDAGES/DRESSINGS) ×1 IMPLANT
BNDG ESMARCH 4X12 STRL LF (GAUZE/BANDAGES/DRESSINGS) ×1 IMPLANT
BNDG GAUZE DERMACEA FLUFF 4 (GAUZE/BANDAGES/DRESSINGS) ×2 IMPLANT
BNDG STRETCH 4X75 STRL LF (GAUZE/BANDAGES/DRESSINGS) ×1 IMPLANT
CANISTER WOUND CARE 500ML ATS (WOUND CARE) IMPLANT
CNTNR URN SCR LID CUP LEK RST (MISCELLANEOUS) IMPLANT
CUFF TOURN SGL QUICK 12 (TOURNIQUET CUFF) ×1 IMPLANT
CUFF TOURN SGL QUICK 18X4 (TOURNIQUET CUFF) ×1 IMPLANT
DRAIN PENROSE 12X.25 LTX STRL (MISCELLANEOUS) IMPLANT
DRAPE FLUOR MINI C-ARM 54X84 (DRAPES) ×1 IMPLANT
DRSG MEPILEX FLEX 3X3 (GAUZE/BANDAGES/DRESSINGS) IMPLANT
DRSG VAC GRANUFOAM MED (GAUZE/BANDAGES/DRESSINGS) IMPLANT
DURAPREP 26ML APPLICATOR (WOUND CARE) ×1 IMPLANT
ELECTRODE REM PT RTRN 9FT ADLT (ELECTROSURGICAL) ×1 IMPLANT
GAUZE SPONGE 4X4 12PLY STRL (GAUZE/BANDAGES/DRESSINGS) ×1 IMPLANT
GAUZE XEROFORM 1X8 LF (GAUZE/BANDAGES/DRESSINGS) ×2 IMPLANT
GLOVE BIO SURGEON STRL SZ7 (GLOVE) ×1 IMPLANT
GLOVE INDICATOR 7.0 STRL GRN (GLOVE) ×1 IMPLANT
GLOVE INDICATOR 7.5 STRL GRN (GLOVE) ×1 IMPLANT
GOWN STRL REUS W/ TWL LRG LVL3 (GOWN DISPOSABLE) ×2 IMPLANT
GRAFT SKIN WND MICRO 38 (Tissue) IMPLANT
HANDLE YANKAUER SUCT BULB TIP (MISCELLANEOUS) IMPLANT
HANDPIECE VERSAJET DEBRIDEMENT (MISCELLANEOUS) IMPLANT
KIT TURNOVER KIT A (KITS) ×1 IMPLANT
MANIFOLD NEPTUNE II (INSTRUMENTS) ×1 IMPLANT
NDL HYPO 25X1 1.5 SAFETY (NEEDLE) ×2 IMPLANT
NDL SAFETY ECLIPSE 18X1.5 (NEEDLE) ×1 IMPLANT
NEEDLE HYPO 25X1 1.5 SAFETY (NEEDLE) ×2 IMPLANT
NS IRRIG 500ML POUR BTL (IV SOLUTION) ×1 IMPLANT
PACK EXTREMITY ARMC (MISCELLANEOUS) ×1 IMPLANT
PENCIL SMOKE EVACUATOR (MISCELLANEOUS) ×1 IMPLANT
SOLUTION PREP PVP 2OZ (MISCELLANEOUS) ×1 IMPLANT
SPONGE T-LAP 18X18 ~~LOC~~+RFID (SPONGE) ×1 IMPLANT
STAPLER SKIN PROX 35W (STAPLE) ×1 IMPLANT
STOCKINETTE M/LG 89821 (MISCELLANEOUS) ×1 IMPLANT
STRIP CLOSURE SKIN 1/4X4 (GAUZE/BANDAGES/DRESSINGS) ×1 IMPLANT
SUT ETHILON 2 0 FS 18 (SUTURE) ×2 IMPLANT
SUT ETHILON 2 0 FSLX (SUTURE) ×1 IMPLANT
SUT PROLENE 2 0 FS (SUTURE) IMPLANT
SUT VIC AB 2-0 CT1 TAPERPNT 27 (SUTURE) ×1 IMPLANT
SUT VIC AB 2-0 SH 27XBRD (SUTURE) ×1 IMPLANT
SUT VIC AB 3-0 SH 27X BRD (SUTURE) ×2 IMPLANT
SUTURE EHLN 3-0 FS-10 30 BLK (SUTURE) ×2 IMPLANT
SWAB CULTURE AMIES ANAERIB BLU (MISCELLANEOUS) IMPLANT
SYR 10ML LL (SYRINGE) ×2 IMPLANT
TIP FAN IRRIG PULSAVAC PLUS (DISPOSABLE) IMPLANT
TRAP FLUID SMOKE EVACUATOR (MISCELLANEOUS) ×1 IMPLANT
WATER STERILE IRR 500ML POUR (IV SOLUTION) ×1 IMPLANT

## 2023-06-14 NOTE — Anesthesia Postprocedure Evaluation (Signed)
 Anesthesia Post Note  Patient: Scott Hale  Procedure(s) Performed: AMPUTATION, FOOT, TRANSMETATARSAL (Right: Toe)  Patient location during evaluation: PACU Anesthesia Type: General Level of consciousness: awake and alert Pain management: pain level controlled Vital Signs Assessment: post-procedure vital signs reviewed and stable Respiratory status: spontaneous breathing, nonlabored ventilation, respiratory function stable and patient connected to nasal cannula oxygen Cardiovascular status: blood pressure returned to baseline and stable Postop Assessment: no apparent nausea or vomiting Anesthetic complications: no   No notable events documented.   Last Vitals:  Vitals:   06/14/23 0925 06/14/23 0945  BP:  109/71  Pulse: 73 70  Resp: 16 (!) 9  Temp:  36.8 C  SpO2: 100% 96%    Last Pain:  Vitals:   06/14/23 0925  TempSrc:   PainSc: 0-No pain                 Portia Brittle Camella Seim

## 2023-06-14 NOTE — Transfer of Care (Signed)
 Immediate Anesthesia Transfer of Care Note  Patient: Priyan Kippen  Procedure(s) Performed: AMPUTATION, FOOT, TRANSMETATARSAL (Right: Toe)  Patient Location: PACU  Anesthesia Type:General  Level of Consciousness: drowsy  Airway & Oxygen Therapy: Patient Spontanous Breathing and Patient connected to face mask oxygen  Post-op Assessment: Report given to RN and Post -op Vital signs reviewed and stable  Post vital signs: Reviewed and stable  Last Vitals:  Vitals Value Taken Time  BP 129/69 06/14/23 0915  Temp    Pulse 71 06/14/23 0918  Resp 15 06/14/23 0918  SpO2 100 % 06/14/23 0918  Vitals shown include unfiled device data.  Last Pain:  Vitals:   06/14/23 0739  TempSrc: Oral  PainSc: 0-No pain      Patients Stated Pain Goal: 0 (06/09/23 0513)  Complications: No notable events documented.

## 2023-06-14 NOTE — Progress Notes (Signed)
 PROGRESS NOTE    Scott Hale  ZOX:[id number] DOB: Sep 30, 1950 DOA: 06/07/2023 PCP: Lyle San, MD    Brief Narrative:   Scott Hale is a 73 y.o. male with medical history significant of DM2, HTN, CKD who presented with right foot pain.   Underwent transmetatarsal amputation of right foot and wound VAC application on 5/2   Assessment & Plan:   Principal Problem:   Cellulitis Active Problems:   Gangrene of toe of right foot (HCC)   PAD (peripheral artery disease) (HCC)   Cellulitis of right foot   Diabetic foot infection (HCC)   Gangrene of right foot (HCC)  RLE cellulitis  Possible osteomyelitis and septic arthritis --received vanc/cefe/flagyl  in the ED, then switched to Ancef .  No open wounds on presentation, but wounds opened up in between toes on 4/28.  ID and podiatry consulted.  Abx switched to abx to Unasyn  and Zyvox , per ID. --MRI right foot showed multiple areas of osteomyelitis Plan: Continue IV antibiotics per ID recommendation, currently Zosyn  and Zyvox  Postoperative care status post TMA Local wound care Podiatry to take back to OR tomorrow AM  PAD --ABI with Findings indicative of moderate right and mild left lower extremity peripheral arterial disease.  --Vascular surgery consulted --S/p angio with stent placement x 2 Plan: Hep gtt, resume 4 hours after OR   Severe Sepsis --fever, leukocytosis, with AKI, source cellulitis   DM2 --recent A1c 7.6.  Pt reported taking Levemir 80u nightly at home. --Continue glargine to 45u nightly --ACHS and SSI   HTN --resume coreg    AKI on CKD 3a --Cr 1.85 on presentation, improved to 1.37 with IVF. --oral hydration now   Hyponatremia --of unclear significance   DVT prophylaxis: hep gtt Code Status: Full Family Communication: Friend at bedside 4/30, 5/1 Disposition Plan: Status is: Inpatient Remains inpatient appropriate because: Lower extremity osteomyelitis   Level of care:  Med-Surg  Consultants:  ID Podiatry Vascular surgery  Procedures:  Lower extremity angiogram 5/1  Antimicrobials: Unasyn  Zyvox    Subjective: Seen and examined.  Resting in bed.  No distress.  Objective: Vitals:   06/14/23 0925 06/14/23 0945 06/14/23 1001 06/14/23 1112  BP:  109/71 110/70 118/71  Pulse: 73 70 71 69  Resp: 16 (!) 9 18 18   Temp:  98.3 F (36.8 C) 98.3 F (36.8 C) 98.4 F (36.9 C)  TempSrc:   Oral Oral  SpO2: 100% 96% 98% 98%  Weight:      Height:        Intake/Output Summary (Last 24 hours) at 06/14/2023 1220 Last data filed at 06/14/2023 0944 Gross per 24 hour  Intake 1094.07 ml  Output 1500 ml  Net -405.93 ml   Filed Weights   06/07/23 1453 06/13/23 0927 06/14/23 0739  Weight: 108 kg 108 kg 108 kg    Examination:  General exam: No acute distress Respiratory system: Lungs clear.  Normal work of breathing to room air Cardiovascular system: S1-S2, RRR, no murmurs, no pedal edema Gastrointestinal system: Soft, NT/ND, normal bowel Central nervous system: Alert and oriented. No focal neurological deficits. Extremities: Status post right TMA Skin: No rashes, lesions or ulcers Psychiatry: Judgement and insight appear normal. Mood & affect appropriate.     Data Reviewed: I have personally reviewed following labs and imaging studies  CBC: Recent Labs  Lab 06/07/23 1653 06/08/23 0440 06/09/23 0454 06/10/23 0530 06/11/23 0315 06/12/23 0508 06/13/23 0514  WBC 15.6*   < > 15.8* 16.4* 18.1* 18.8* 17.2*  NEUTROABS 12.6*  --   --   --   --   --   --  HGB 13.2   < > 11.8* 11.4* 11.6* 11.0* 10.5*  HCT 38.9*   < > 34.5* 32.9* 34.1* 32.3* 31.1*  MCV 90.5   < > 88.7 87.5 89.3 89.0 90.4  PLT 255   < > 253 283 281 331 326   < > = values in this interval not displayed.   Basic Metabolic Panel: Recent Labs  Lab 06/08/23 0440 06/09/23 0454 06/10/23 0530 06/11/23 0315 06/12/23 0508  NA 131* 131* 136 131* 134*  K 3.5 3.6 3.8 4.2 4.2  CL 103 99  101 96* 99  CO2 24 23 26 25 25   GLUCOSE 132* 111* 64* 62* 100*  BUN 13 12 10 10 11   CREATININE 1.62* 1.37* 1.34* 1.27* 1.18  CALCIUM  8.1* 8.4* 8.3* 8.8* 8.8*  MG 2.2  --   --   --   --    GFR: Estimated Creatinine Clearance: 74 mL/min (by C-G formula based on SCr of 1.18 mg/dL). Liver Function Tests: No results for input(s): "AST", "ALT", "ALKPHOS", "BILITOT", "PROT", "ALBUMIN" in the last 168 hours. No results for input(s): "LIPASE", "AMYLASE" in the last 168 hours. No results for input(s): "AMMONIA" in the last 168 hours. Coagulation Profile: Recent Labs  Lab 06/12/23 1209  INR 1.3*   Cardiac Enzymes: No results for input(s): "CKTOTAL", "CKMB", "CKMBINDEX", "TROPONINI" in the last 168 hours. BNP (last 3 results) No results for input(s): "PROBNP" in the last 8760 hours. HbA1C: No results for input(s): "HGBA1C" in the last 72 hours.  CBG: Recent Labs  Lab 06/13/23 1255 06/13/23 1648 06/13/23 2114 06/14/23 0739 06/14/23 0916  GLUCAP 112* 283* 202* 122* 119*   Lipid Profile: No results for input(s): "CHOL", "HDL", "LDLCALC", "TRIG", "CHOLHDL", "LDLDIRECT" in the last 72 hours. Thyroid Function Tests: No results for input(s): "TSH", "T4TOTAL", "FREET4", "T3FREE", "THYROIDAB" in the last 72 hours. Anemia Panel: No results for input(s): "VITAMINB12", "FOLATE", "FERRITIN", "TIBC", "IRON", "RETICCTPCT" in the last 72 hours. Sepsis Labs: Recent Labs  Lab 06/07/23 1725  LATICACIDVEN 1.5    Recent Results (from the past 240 hours)  Resp panel by RT-PCR (RSV, Flu A&B, Covid) Anterior Nasal Swab     Status: None   Collection Time: 06/07/23  4:53 PM   Specimen: Anterior Nasal Swab  Result Value Ref Range Status   SARS Coronavirus 2 by RT PCR NEGATIVE NEGATIVE Final    Comment: (NOTE) SARS-CoV-2 target nucleic acids are NOT DETECTED.  The SARS-CoV-2 RNA is generally detectable in upper respiratory specimens during the acute phase of infection. The lowest concentration  of SARS-CoV-2 viral copies this assay can detect is 138 copies/mL. A negative result does not preclude SARS-Cov-2 infection and should not be used as the sole basis for treatment or other patient management decisions. A negative result may occur with  improper specimen collection/handling, submission of specimen other than nasopharyngeal swab, presence of viral mutation(s) within the areas targeted by this assay, and inadequate number of viral copies(<138 copies/mL). A negative result must be combined with clinical observations, patient history, and epidemiological information. The expected result is Negative.  Fact Sheet for Patients:  BloggerCourse.com  Fact Sheet for Healthcare Providers:  SeriousBroker.it  This test is no t yet approved or cleared by the [place]  FDA and  has been authorized for detection and/or diagnosis of SARS-CoV-2 by FDA under an Emergency Use Authorization (EUA). This EUA will remain  in effect (meaning this test can be used) for the duration of the COVID-19 declaration under Section 564(b)(1) of the  Act, 21 U.S.C.section 360bbb-3(b)(1), unless the authorization is terminated  or revoked sooner.       Influenza A by PCR NEGATIVE NEGATIVE Final   Influenza B by PCR NEGATIVE NEGATIVE Final    Comment: (NOTE) The Xpert Xpress SARS-CoV-2/FLU/RSV plus assay is intended as an aid in the diagnosis of influenza from Nasopharyngeal swab specimens and should not be used as a sole basis for treatment. Nasal washings and aspirates are unacceptable for Xpert Xpress SARS-CoV-2/FLU/RSV testing.  Fact Sheet for Patients: BloggerCourse.com  Fact Sheet for Healthcare Providers: SeriousBroker.it  This test is not yet approved or cleared by the [place]  FDA and has been authorized for detection and/or diagnosis of SARS-CoV-2 by FDA under an Emergency Use  Authorization (EUA). This EUA will remain in effect (meaning this test can be used) for the duration of the COVID-19 declaration under Section 564(b)(1) of the Act, 21 U.S.C. section 360bbb-3(b)(1), unless the authorization is terminated or revoked.     Resp Syncytial Virus by PCR NEGATIVE NEGATIVE Final    Comment: (NOTE) Fact Sheet for Patients: BloggerCourse.com  Fact Sheet for Healthcare Providers: SeriousBroker.it  This test is not yet approved or cleared by the [place]  FDA and has been authorized for detection and/or diagnosis of SARS-CoV-2 by FDA under an Emergency Use Authorization (EUA). This EUA will remain in effect (meaning this test can be used) for the duration of the COVID-19 declaration under Section 564(b)(1) of the Act, 21 U.S.C. section 360bbb-3(b)(1), unless the authorization is terminated or revoked.  Performed at [facility], [address] [place], [place] [address]   Culture, blood (routine x 2)     Status: None   Collection Time: 06/07/23  5:25 PM   Specimen: BLOOD  Result Value Ref Range Status   Specimen Description BLOOD RIGHT ANTECUBITAL  Final   Special Requests   Final    BOTTLES DRAWN AEROBIC AND ANAEROBIC Blood Culture results may not be optimal due to an inadequate volume of blood received in culture bottles   Culture   Final    NO GROWTH 5 DAYS Performed at [facility], [address] [place], [place] [address]    Report Status 06/12/2023 FINAL  Final  Culture, blood (routine x 2)     Status: None   Collection Time: 06/07/23  5:27 PM   Specimen: BLOOD  Result Value Ref Range Status   Specimen Description BLOOD LEFT ANTECUBITAL  Final   Special Requests   Final    BOTTLES DRAWN AEROBIC AND ANAEROBIC Blood Culture results may not be optimal due to an inadequate volume of blood received in culture bottles   Culture   Final    NO GROWTH 5 DAYS Performed at  [facility], [address] [place], [place] [address]    Report Status 06/12/2023 FINAL  Final  Aerobic Culture w Gram Stain (superficial specimen)     Status: None   Collection Time: 06/10/23  7:35 PM   Specimen: Wound  Result Value Ref Range Status   Specimen Description   Final    WOUND Performed at [facility], [address] [place], [place] [address]    Special Requests   Final    RIGHT FOOT Performed at [facility], [address] [place], [place] [address]    Gram Stain NO WBC SEEN FEW GRAM POSITIVE COCCI IN PAIRS   Final   Culture   Final    RARE GROUP B STREP(S.AGALACTIAE)ISOLATED TESTING AGAINST  S. AGALACTIAE NOT ROUTINELY PERFORMED DUE TO PREDICTABILITY OF AMP/PEN/VAN SUSCEPTIBILITY. WITHIN MIXED NORMAL SKIN FLORA Performed at [facility], [address] [place], [place] [address]    Report Status 06/13/2023 FINAL  Final         Radiology Studies: PERIPHERAL VASCULAR CATHETERIZATION Result Date: 06/13/2023 See surgical note for result.       Scheduled Meds:  atorvastatin   40 mg Oral Daily   carvedilol   3.125 mg Oral BID WC   furosemide   40 mg Oral Daily   insulin  aspart  0-9 Units Subcutaneous TID WC   insulin  glargine-yfgn  45 Units Subcutaneous QHS   sacubitril -valsartan   1 tablet Oral BID   spironolactone   25 mg Oral Daily   Continuous Infusions:  heparin      linezolid  (ZYVOX ) IV Stopped (06/13/23 2221)   piperacillin -tazobactam (ZOSYN )  IV 3.375 g (06/14/23 0516)     LOS: 6 days    Tiajuana Fluke, MD [facility]   If 7PM-7AM, please contact night-coverage  06/14/2023, 12:20 PM

## 2023-06-14 NOTE — Progress Notes (Signed)
 History and Physical Interval Note:  06/14/2023 8:12 AM  Scott Hale  has presented today for surgery, with the diagnosis of gangrene and osteomyelitis of the right forefoot.  The various methods of treatment have been discussed with the patient and family. After consideration of risks, benefits and other options for treatment, the patient has consented to   Procedure(s): AMPUTATION, FOOT, TRANSMETATARSAL (Right) as a surgical intervention.  The patient's history has been reviewed, patient examined, no change in status, stable for surgery.  I have reviewed the patient's chart and labs.  Questions were answered to the patient's satisfaction.     Karlene Overcast Juliannah Ohmann

## 2023-06-14 NOTE — Anesthesia Preprocedure Evaluation (Addendum)
 Anesthesia Evaluation  Patient identified by MRN, date of birth, ID band Patient awake    Reviewed: Allergy & Precautions, NPO status , Patient's Chart, lab work & pertinent test results  History of Anesthesia Complications Negative for: history of anesthetic complications  Airway Mallampati: III  TM Distance: >3 FB Neck ROM: full    Dental  (+) Chipped, Poor Dentition, Implants   Pulmonary neg shortness of breath, sleep apnea    Pulmonary exam normal        Cardiovascular Exercise Tolerance: Good hypertension, (-) angina + CABG, + Peripheral Vascular Disease and +CHF  (-) Past MI Normal cardiovascular exam     Neuro/Psych negative neurological ROS  negative psych ROS   GI/Hepatic negative GI ROS, Neg liver ROS,neg GERD  ,,  Endo/Other  diabetes, Type 2    Renal/GU negative Renal ROS  negative genitourinary   Musculoskeletal   Abdominal   Peds  Hematology negative hematology ROS (+)   Anesthesia Other Findings Past Medical History: No date: Arthritis No date: CHF (congestive heart failure) (HCC) No date: Diabetes mellitus without complication (HCC) No date: Erectile dysfunction No date: History of kidney stones No date: Hypertension No date: Sleep apnea  Past Surgical History: No date: CARDIAC CATHETERIZATION 03/07/2021: COLONOSCOPY; N/A     Comment:  Procedure: COLONOSCOPY;  Surgeon: [name],               MD;  Location: [facility] ENDOSCOPY;  Service: Endoscopy;                Laterality: N/A;  IDDM 04/09/2017: COLONOSCOPY WITH PROPOFOL ; N/A     Comment:  Procedure: COLONOSCOPY WITH PROPOFOL ;  Surgeon: [name], MD;  Location: [facility] ENDOSCOPY;  Service:               Gastroenterology;  Laterality: N/A; 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 No date: JOINT REPLACEMENT     Comment:  total knee 06/13/2023: LOWER EXTREMITY ANGIOGRAPHY; Right     Comment:  Procedure: Lower Extremity  Angiography;  Surgeon: Celso College, MD;  Location: [facility] INVASIVE CV LAB;  Service:               Cardiovascular;  Laterality: Right; No date: RENAL ARTERY STENT     Comment:  due to kidney stones  BMI    Body Mass Index: 32.29 kg/m      Reproductive/Obstetrics negative OB ROS                             Anesthesia Physical Anesthesia Plan  ASA: 3  Anesthesia Plan: General   Post-op Pain Management:    Induction: Intravenous  PONV Risk Score and Plan: Propofol  infusion and TIVA  Airway Management Planned: Natural Airway and Nasal Cannula  Additional Equipment:   Intra-op Plan:   Post-operative Plan:   Informed Consent: I have reviewed the patients History and Physical, chart, labs and discussed the procedure including the risks, benefits and alternatives for the proposed anesthesia with the patient or authorized representative who has indicated his/her understanding and acceptance.     Dental Advisory Given  Plan Discussed with: Anesthesiologist, CRNA and Surgeon  Anesthesia Plan Comments: (Patient consented for risks of anesthesia including but not limited to:  - adverse reactions to medications - risk  of airway placement if required - damage to eyes, teeth, lips or other oral mucosa - nerve damage due to positioning  - sore throat or hoarseness - Damage to heart, brain, nerves, lungs, other parts of body or loss of life  Patient voiced understanding and assent.)       Anesthesia Quick Evaluation

## 2023-06-14 NOTE — Progress Notes (Signed)
 Date of Admission:  06/07/2023     ID: Scott Hale is a 73 y.o. male  Principal Problem:   Cellulitis Active Problems:   Gangrene of toe of right foot (HCC)   PAD (peripheral artery disease) (HCC)   Cellulitis of right foot   Diabetic foot infection (HCC)   Gangrene of right foot [facility])  Patient underwent transmetatarsal amputation of the right foot today The surgical note says there was significant purulence, infection and a wound VAC was placed Subjective: Patient is doing okay says he is feeling better  Medications:   atorvastatin   40 mg Oral Daily   carvedilol   3.125 mg Oral BID WC   furosemide   40 mg Oral Daily   insulin  aspart  0-9 Units Subcutaneous TID WC   insulin  glargine-yfgn  45 Units Subcutaneous QHS   sacubitril -valsartan   1 tablet Oral BID   spironolactone   25 mg Oral Daily    Objective: Vital signs in last 24 hours: Patient Vitals for the past 24 hrs:  BP Temp Temp src Pulse Resp SpO2 Height Weight  06/14/23 1112 118/71 98.4 F (36.9 C) Oral 69 18 98 % -- --  06/14/23 1001 110/70 98.3 F (36.8 C) Oral 71 18 98 % -- --  06/14/23 0945 109/71 98.3 F (36.8 C) -- 70 (!) 9 96 % -- --  06/14/23 0925 -- -- -- 73 16 100 % -- --  06/14/23 0916 129/69 -- -- 75 17 100 % -- --  06/14/23 0915 129/69 99.7 F (37.6 C) -- 72 17 100 % -- --  06/14/23 0739 135/76 99.1 F (37.3 C) Oral 70 18 95 % 6' (1.829 m) 108 kg  06/14/23 0227 119/73 99.8 F (37.7 C) -- 66 18 100 % -- --  06/13/23 1929 (!) 142/73 (!) 101.3 F (38.5 C) Oral 83 18 100 % -- --  06/13/23 1520 128/72 97.7 F (36.5 C) Oral 69 17 99 % -- --  06/13/23 1345 138/66 -- -- 63 11 100 % -- --  06/13/23 1330 131/63 -- -- 64 17 98 % -- --  06/13/23 1315 (!) 154/64 -- -- (!) 55 18 99 % -- --  06/13/23 1300 126/62 -- -- (!) 56 14 97 % -- --  06/13/23 1247 128/63 -- -- (!) 0 -- -- -- --  06/13/23 1245 128/63 -- -- 62 -- 99 % -- --  06/13/23 1243 122/65 -- -- 62 -- 99 % -- --  06/13/23 1241 131/63 -- -- (!) 58  -- 99 % -- --  06/13/23 1239 128/63 -- -- (!) 59 -- 100 % -- --  06/13/23 1237 126/63 -- -- (!) 59 -- 99 % -- --  06/13/23 1235 135/63 -- -- 66 -- 99 % -- --  06/13/23 1233 135/71 -- -- 63 -- 100 % -- --  06/13/23 1231 129/69 -- -- 63 -- 100 % -- --  06/13/23 1229 133/68 -- -- 63 -- 100 % -- --  06/13/23 1227 131/62 -- -- 60 -- 99 % -- --  06/13/23 1226 -- -- -- -- -- 100 % -- --  06/13/23 1225 126/61 -- -- 62 -- (!) 85 % -- --  06/13/23 1224 -- -- -- -- -- 96 % -- --  06/13/23 1223 118/62 -- -- 61 -- (!) 83 % -- --  06/13/23 1221 123/61 -- -- (!) 58 -- 97 % -- --  06/13/23 1219 125/66 -- -- 61 -- 100 % -- --  06/13/23 1217  121/62 -- -- 61 -- 100 % -- --  06/13/23 1215 115/68 -- -- 68 -- 100 % -- --  06/13/23 1213 (!) 117/57 -- -- 60 -- 100 % -- --  06/13/23 1211 (!) 114/57 -- -- (!) 59 -- 99 % -- --  06/13/23 1209 (!) 117/58 -- -- 61 -- 99 % -- --  06/13/23 1207 (!) 120/56 -- -- 68 -- 99 % -- --  06/13/23 1205 (!) 119/59 -- -- 61 -- 98 % -- --  06/13/23 1203 (!) 121/57 -- -- 61 -- 98 % -- --  06/13/23 1201 124/61 -- -- 61 -- 99 % -- --       PHYSICAL EXAM:  General: Alert, cooperative, no distress, appears stated age.  Lungs: Clear to auscultation bilaterally. No Wheezing or Rhonchi. No rales. Heart: Regular rate and rhythm, no murmur, rub or gallop. Abdomen: Soft, non-tender,not distended. Bowel sounds normal. No masses Extremities: Gangrene 3/4 toes rt foot        S/p TMA- surgical dresisng not removed   Skin: No rashes or lesions. Or bruising Lymph: Cervical, supraclavicular normal. Neurologic: Grossly non-focal  Lab Results    Latest Ref Rng & Units 06/13/2023    5:14 AM 06/12/2023    5:08 AM 06/11/2023    3:15 AM  CBC  WBC 4.0 - 10.5 K/uL 17.2  18.8  18.1   Hemoglobin 13.0 - 17.0 g/dL 78.2  95.6  21.3   Hematocrit 39.0 - 52.0 % 31.1  32.3  34.1   Platelets 150 - 400 K/uL 326  331  281        Latest Ref Rng & Units 06/12/2023    5:08 AM 06/11/2023    3:15  AM 06/10/2023    5:30 AM  CMP  Glucose 70 - 99 mg/dL 086  62  64   BUN 8 - 23 mg/dL 11  10  10    Creatinine 0.61 - 1.24 mg/dL 5.78  4.69  6.29   Sodium 135 - 145 mmol/L 134  131  136   Potassium 3.5 - 5.1 mmol/L 4.2  4.2  3.8   Chloride 98 - 111 mmol/L 99  96  101   CO2 22 - 32 mmol/L 25  25  26    Calcium  8.9 - 10.3 mg/dL 8.8  8.8  8.3           Assessment/Plan: 73 yr male with DM presents with pain rt foot of 2 weeks duration   Rt foot infection- diabetic/PAD/ neuropathy Rt foot swelling and erythema and tenderness with interdigital cleft maceration between 3/4 th toe with discharge 4th toe discolored  soft tissue infection of the rt foot now with gangrene of 3/4 toes - MRI showed non viable Troy tissue extending from the interdital cleft 3/4 along the dorsum Continue zosyn  and linezolid  S/p TMA- has wound vac   Anemia  PAD- underwent angio and had angioplasty and stent placement of rt ATA, SFA, popliteal  DM- no recent A1c   Htn   CAD s/p CABG   RT TKA) no evidence of PJI ? Discussed the management with the patient and his partner at bedside ID will not routinely see him this weekend.  On-call ID available by phone for urgent issues.  Call if needed.

## 2023-06-14 NOTE — Progress Notes (Signed)
 PHARMACY - ANTICOAGULATION CONSULT NOTE  Pharmacy Consult for heparin  drip Indication: Severe PAD   No Known Allergies  Patient Measurements: Height: 6' (182.9 cm) Weight: 108 kg (238 lb 1.6 oz) IBW/kg (Calculated) : 77.6 HEPARIN  DW (KG): 100.3  Vital Signs: Temp: 98.1 F (36.7 C) (05/02 2020) Temp Source: Oral (05/02 2020) BP: 101/59 (05/02 2020) Pulse Rate: 69 (05/02 2020)  Labs: Recent Labs    06/12/23 0508 06/12/23 1209 06/12/23 1836 06/13/23 0514 06/13/23 1627 06/14/23 2022  HGB 11.0*  --   --  10.5*  --   --   HCT 32.3*  --   --  31.1*  --   --   PLT 331  --   --  326  --   --   APTT  --  30  --   --   --   --   LABPROT  --  16.1*  --   --   --   --   INR  --  1.3*  --   --   --   --   HEPARINUNFRC  --   --    < > 0.19* <0.10* 0.33  CREATININE 1.18  --   --   --   --   --    < > = values in this interval not displayed.    Estimated Creatinine Clearance: 74 mL/min (by C-G formula based on SCr of 1.18 mg/dL).   Medical History: Past Medical History:  Diagnosis Date   Arthritis    CHF (congestive heart failure) (HCC)    Diabetes mellitus without complication (HCC)    Erectile dysfunction    History of kidney stones    Hypertension    Sleep apnea     Medications:  Medications Prior to Admission  Medication Sig Dispense Refill Last Dose/Taking   acetaminophen  (TYLENOL ) 325 MG tablet Take 650 mg by mouth every 6 (six) hours as needed for mild pain, headache, fever or moderate pain.   Taking As Needed   ASPIRIN  81 PO Take by mouth daily.   Past Week   atorvastatin  (LIPITOR) 40 MG tablet TAKE 1 TABLET BY MOUTH EVERY DAY *NEW PRESCRIPTION REQUEST* 90 tablet 1 Past Week   carvedilol  (COREG ) 3.125 MG tablet TAKE ONE (1) TABLET BY MOUTH TWICE DAILY *NEW PRESCRIPTION REQUEST* 180 tablet 1 Past Week   cholecalciferol (VITAMIN D) 25 MCG (1000 UT) tablet Take 1,000 Units by mouth daily.   Past Week   dapagliflozin  propanediol (FARXIGA ) 10 MG TABS tablet TAKE 1  TABLET BY MOUTH EVERY DAY BEFORE BREAKFAST *NEW PRESCRIPTION REQUEST* 90 tablet 1 06/07/2023   Dulaglutide 1.5 MG/0.5ML SOPN Inject 1.5 mg into the skin once a week. Mondays   Taking   furosemide  (LASIX ) 40 MG tablet TAKE 1 TABLET BY MOUTH EVERY DAY *NEW PRESCRIPTION REQUEST* 90 tablet 1 Past Week   glipiZIDE (GLUCOTROL) 10 MG tablet Take 10 mg by mouth daily before breakfast.   06/06/2023   insulin  glargine (LANTUS ) 100 UNIT/ML Solostar Pen Inject 40-80 Units into the skin at bedtime.   06/06/2023   Niacin, Antihyperlipidemic, 500 MG TABS Take 500 mg by mouth daily with breakfast.   Past Week   sacubitril -valsartan  (ENTRESTO ) 97-103 MG Take 1 tablet by mouth 2 (two) times daily. 180 tablet 1 06/07/2023 Morning   spironolactone  (ALDACTONE ) 25 MG tablet TAKE 1 TABLET BY MOUTH EVERY DAY *NEW PRESCRIPTION REQUEST* 90 tablet 1 Past Week   tadalafil (CIALIS) 5 MG tablet Take by mouth daily  as needed for erectile dysfunction.   Taking As Needed   vitamin B-12 (CYANOCOBALAMIN) 500 MCG tablet Take 500 mcg by mouth daily.   06/07/2023    Assessment: 73 yo male to start heparin  drip for severe PAD with anticipated angiogram on 5/1 Hx: arthritis, CHF, diabetes mellitus 2, kidney stones, hypertension and sleep apnea  On Lovenox  50 mg q24h for DVT ppx with last dose 4/29@2123  30s   INR 1.3  0502 Heparin  drip to resume 4 hours after surgery, no bolus  Date Time HL Rate/Comment 05/02 2022 0.33 Therapeutic x1  Goal of Therapy:  Heparin  level 0.3-0.7 units/ml Monitor platelets by anticoagulation protocol: Yes   Plan:  Continue heparin  infusion at 2350 units/hr Check confirmatory heparin  with AM labs Monitor daily heparin  levels while on heparin  infusion Continue to monitor H&H and platelets  Thank you for involving pharmacy in this patient's care.   Ananias Balls, PharmD Clinical Pharmacist 06/14/2023 8:54 PM

## 2023-06-14 NOTE — Progress Notes (Addendum)
 PHARMACY - ANTICOAGULATION CONSULT NOTE  Pharmacy Consult for heparin  drip Indication: Severe PAD   No Known Allergies  Patient Measurements: Height: 6' (182.9 cm) Weight: 108 kg (238 lb 1.6 oz) IBW/kg (Calculated) : 77.6 HEPARIN  DW (KG): 100.3  Vital Signs: Temp: 98.3 F (36.8 C) (05/02 0945) Temp Source: Oral (05/02 0739) BP: 109/71 (05/02 0945) Pulse Rate: 70 (05/02 0945)  Labs: Recent Labs    06/12/23 0508 06/12/23 1209 06/12/23 1836 06/13/23 0514 06/13/23 1627  HGB 11.0*  --   --  10.5*  --   HCT 32.3*  --   --  31.1*  --   PLT 331  --   --  326  --   APTT  --  30  --   --   --   LABPROT  --  16.1*  --   --   --   INR  --  1.3*  --   --   --   HEPARINUNFRC  --   --  <0.10* 0.19* <0.10*  CREATININE 1.18  --   --   --   --     Estimated Creatinine Clearance: 74 mL/min (by C-G formula based on SCr of 1.18 mg/dL).   Medical History: Past Medical History:  Diagnosis Date   Arthritis    CHF (congestive heart failure) (HCC)    Diabetes mellitus without complication (HCC)    Erectile dysfunction    History of kidney stones    Hypertension    Sleep apnea     Medications:  Medications Prior to Admission  Medication Sig Dispense Refill Last Dose/Taking   acetaminophen  (TYLENOL ) 325 MG tablet Take 650 mg by mouth every 6 (six) hours as needed for mild pain, headache, fever or moderate pain.   Taking As Needed   ASPIRIN  81 PO Take by mouth daily.   Past Week   atorvastatin  (LIPITOR) 40 MG tablet TAKE 1 TABLET BY MOUTH EVERY DAY *NEW PRESCRIPTION REQUEST* 90 tablet 1 Past Week   carvedilol  (COREG ) 3.125 MG tablet TAKE ONE (1) TABLET BY MOUTH TWICE DAILY *NEW PRESCRIPTION REQUEST* 180 tablet 1 Past Week   cholecalciferol (VITAMIN D) 25 MCG (1000 UT) tablet Take 1,000 Units by mouth daily.   Past Week   dapagliflozin  propanediol (FARXIGA ) 10 MG TABS tablet TAKE 1 TABLET BY MOUTH EVERY DAY BEFORE BREAKFAST *NEW PRESCRIPTION REQUEST* 90 tablet 1 06/07/2023    Dulaglutide 1.5 MG/0.5ML SOPN Inject 1.5 mg into the skin once a week. Mondays   Taking   furosemide  (LASIX ) 40 MG tablet TAKE 1 TABLET BY MOUTH EVERY DAY *NEW PRESCRIPTION REQUEST* 90 tablet 1 Past Week   glipiZIDE (GLUCOTROL) 10 MG tablet Take 10 mg by mouth daily before breakfast.   06/06/2023   insulin  glargine (LANTUS ) 100 UNIT/ML Solostar Pen Inject 40-80 Units into the skin at bedtime.   06/06/2023   Niacin, Antihyperlipidemic, 500 MG TABS Take 500 mg by mouth daily with breakfast.   Past Week   sacubitril -valsartan  (ENTRESTO ) 97-103 MG Take 1 tablet by mouth 2 (two) times daily. 180 tablet 1 06/07/2023 Morning   spironolactone  (ALDACTONE ) 25 MG tablet TAKE 1 TABLET BY MOUTH EVERY DAY *NEW PRESCRIPTION REQUEST* 90 tablet 1 Past Week   tadalafil (CIALIS) 5 MG tablet Take by mouth daily as needed for erectile dysfunction.   Taking As Needed   vitamin B-12 (CYANOCOBALAMIN) 500 MCG tablet Take 500 mcg by mouth daily.   06/07/2023    Assessment: 73 yo male to start heparin  drip  for severe PAD with anticipated angiogram on 5/1 Hx: arthritis, CHF, diabetes mellitus 2, kidney stones, hypertension and sleep apnea  On Lovenox  50 mg q24h for DVT ppx with last dose 4/29@2123  30s   INR 1.3  0502 Heparin  drip to resume 4 hours after surgery, no bolus   Goal of Therapy:  Heparin  level 0.3-0.7 units/ml Monitor platelets by anticoagulation protocol: Yes   Plan:  ---will resume heparin  infusion at 2350 units/hr at 1300  ( 4 hours post surgery) ---Check heparin  level in ~6 hours from drip restart ---Continue to monitor H&H and platelets  Thomasine Flick PharmD Clinical Pharmacist 06/14/2023

## 2023-06-14 NOTE — Op Note (Signed)
 Full Operative Report  Date of Operation: 9:40 AM, 06/14/2023   Patient: Scott Hale - 73 y.o. male  Surgeon: Scott Hale, DPM   Assistant: None  Diagnosis: Gangrene and osteomyelitis right forefoot  Procedure:  1.  Transmetatarsal amputation, right foot 2.  Wound VAC application 9 x 6 cm, right foot    Anesthesia: General  No responsible provider has been recorded for the case.  Anesthesiologist: Piscitello, Portia Brittle, MD CRNA: Scott Carmin, CRNA; Scott Budd, CRNA   Estimated Blood Loss: 50 mL  Hemostasis: 1) Anatomical dissection, mechanical compression, electrocautery 2) no tourniquet was used during procedure  Implants: Implant Name Type Inv. Item Serial No. Manufacturer Lot No. LRB No. Used Action  GRAFT SKIN WND MICRO 38 - [id number] Tissue GRAFT SKIN WND MICRO 38  [employer] [id number] Right 1 Wasted    Materials: Wound VAC black sponge  Injectables: 1) Pre-operatively: 20 cc of 50:50 mixture 1%lidocaine  plain and 0.5% marcaine  plain 2) Post-operatively: None   Specimens: Pathology: Right forefoot for pathology microbiology: Deep tissue culture and separately tissue swab culture right forefoot for culture   Antibiotics: IV antibiotics given per schedule on the floor  Drains: None  Complications: Patient tolerated the procedure well without complication.   Operative findings: As below in detailed report  Indications for Procedure: Scott Hale presents to Scott Hale, Scott Hale, DPM with a chief complaint of gangrene and concern for osteomyelitis of the right 3rd through 5th digits and corresponding metatarsals.  Status post revascularization procedure by vascular surgery yesterday.  The patient has failed conservative treatments of various modalities. At this time the patient has elected to proceed with surgical correction. All alternatives, risks, and complications of the procedures were thoroughly explained to the patient. Patient exhibits  appropriate understanding of all discussion points and informed consent was signed and obtained in the chart with no guarantees to surgical outcome given or implied.  Description of Procedure: Patient was brought to the operating room. Patient remained on their hospital bed in the supine position. A surgical timeout was performed and all members of the operating room, the procedure, and the surgical site were identified. anesthesia occurred as per anesthesia record. Local anesthetic as previously described was then injected about the operative field in a local infiltrative block.  The operative lower extremity as noted above was then prepped and draped in the usual sterile manner. The following procedure then began.  Attention was directed to the RIGHT lower extremity. A fish-mouth type incision was made proximal to the web spaces and encompassed the entire forefoot. The full-thickness incision was made with a longer plantar flap to allow for wound closure. The incision was continued through the soft tissue down to the shafts of the metatarsal bones. A 15 blade was then used to free up the periosteum on all the metatarsal shafts.  There was noted to be significant purulence dorsally especially proximal to the 3rd and 4th toes that extended deep to the metatarsal bones and tracking along the EDL tendon.  There was necrotic skin at the dorsal tissue flap laterally.  A deep tissue culture was harvested with rongeur and passed the back table sent for micro.  A separate swab culture was taken of the peroneal area tracking along the EDL tendon dorsally.  Using an oscillating saw, the metatarsals were cut in a dorsal distal to plantar proximal orientation. The first metatarsal was beveled so that the medial cortex was shorter than the lateral, and the fifth metatarsal was beveled  so that the lateral cortex was shorter than the medial, thus less prominent. The amputation was done so that a metatarsal parabola was  maintained.  The distal portion including all the digits were freed from the metatarsals and soft tissue attachments. The specimen was passed off the field and sent for gross pathology. All remaining non-viable and necrotic tissues were sharply resected and removed. Extensor and flexors tendons were grasped with a hemostat and cut proximally.  Adequate bleeding was seen throughout the procedure.   The surgical site was then flushed with 1000ml of saline under power pulse lavage.  Electrocautery was then used to achieve hemostasis.  Vancomycin  powder was then infiltrated in the surgical site.    Then given concern for purulence and infection decision made to delay the closure and come back in staged fashion for delayed primary closure in 2 to 3 days.  Therefore wound VAC was applied.  Black foam sponge was cut to the size of the amputation site approximately 9 x 6 cm.  This was placed into the flap which was closed over the black foam sponge and wound VAC drape was applied to secure the sponge.  It was set to 125 mmHg with no leaks detected.  The surgical site was then dressed with 4 x 4 gauze Kerlix Ace wrap. The patient tolerated both the procedure and anesthesia well with vital signs stable throughout. The patient was transferred in good condition and all vital signs stable  from the OR to recovery under the discretion of anesthesia.  Condition: Vital signs stable, neurovascular status unchanged from preoperative   Surgical plan:  Due to extensive purulence seen dorsally and infection in the lateral forefoot decision was made to place wound VAC and bring patient back in staged fashion for repeat washout and closure and 2 to 3 days.  Wound VAC to stay intact and functioning until next OR.  Nonweightbearing to the right foot  The patient will be nonweightbearing in a postop shoe to the operative limb until further instructed. The dressing is to remain clean, dry, and intact. Will continue to follow unless  noted elsewhere.   Scott Hale, DPM [facility]

## 2023-06-14 NOTE — Anesthesia Procedure Notes (Signed)
 Procedure Name: LMA Insertion Date/Time: 06/14/2023 8:25 AM  Performed by: Bill Budd, CRNAPre-anesthesia Checklist: Patient identified, Patient being monitored, Timeout performed, Emergency Drugs available and Suction available Patient Re-evaluated:Patient Re-evaluated prior to induction Oxygen Delivery Method: Circle system utilized Preoxygenation: Pre-oxygenation with 100% oxygen Induction Type: IV induction Ventilation: Mask ventilation without difficulty LMA: LMA inserted LMA Size: 5.0 Tube type: Oral Number of attempts: 1 Placement Confirmation: positive ETCO2 and breath sounds checked- equal and bilateral Tube secured with: Tape Dental Injury: Teeth and Oropharynx as per pre-operative assessment

## 2023-06-15 DIAGNOSIS — L03115 Cellulitis of right lower limb: Secondary | ICD-10-CM | POA: Diagnosis not present

## 2023-06-15 LAB — CBC
HCT: 32.1 % — ABNORMAL LOW (ref 39.0–52.0)
Hemoglobin: 10.5 g/dL — ABNORMAL LOW (ref 13.0–17.0)
MCH: 29.8 pg (ref 26.0–34.0)
MCHC: 32.7 g/dL (ref 30.0–36.0)
MCV: 91.2 fL (ref 80.0–100.0)
Platelets: 349 10*3/uL (ref 150–400)
RBC: 3.52 MIL/uL — ABNORMAL LOW (ref 4.22–5.81)
RDW: 13.4 % (ref 11.5–15.5)
WBC: 17.5 10*3/uL — ABNORMAL HIGH (ref 4.0–10.5)
nRBC: 0 % (ref 0.0–0.2)

## 2023-06-15 LAB — GLUCOSE, CAPILLARY
Glucose-Capillary: 228 mg/dL — ABNORMAL HIGH (ref 70–99)
Glucose-Capillary: 295 mg/dL — ABNORMAL HIGH (ref 70–99)
Glucose-Capillary: 302 mg/dL — ABNORMAL HIGH (ref 70–99)
Glucose-Capillary: 237 mg/dL — ABNORMAL HIGH (ref 70–99)

## 2023-06-15 LAB — HEPARIN LEVEL (UNFRACTIONATED): Heparin Unfractionated: 0.41 [IU]/mL (ref 0.30–0.70)

## 2023-06-15 NOTE — Progress Notes (Addendum)
  Subjective:  Patient ID: Scott Hale, male    DOB: 09/09/1950,  MRN: [id number]  Chief Complaint  Patient presents with   Foot Pain    DOS: 06-14-23 Procedure: 1.  Transmetatarsal amputation, right foot 2.  Wound VAC application 9 x 6 cm, right foot     72 y.o. male seen for post op check.  Patient seen bedside this morning.  He does report that he does not have any pain in his right foot.  He is in good spirits this morning aware that he has to stay off his right foot as the amputation heals.  All questions were answered he denies concerns.  Aware of plan for return to the OR on Monday.  Review of Systems: Negative except as noted in the HPI. Denies N/V/F/Ch.   Objective:   Vitals:   06/15/23 0203 06/15/23 0750  BP: 115/68 136/68  Pulse: 70 (!) 55  Resp: 18 16  Temp: 98.5 F (36.9 C) 97.8 F (36.6 C)  SpO2: 98% 97%   Body mass index is 32.29 kg/m. Constitutional Well developed. Well nourished.  Vascular Foot warm and well perfused. Capillary refill normal to all digits.   No calf pain with palpation  Neurologic Normal speech. Oriented to person, place, and time. Epicritic sensation diminished to right foot  Dermatologic Wound VAC and dressing clean dry and intact and wound VAC functioning with 100 to 200 mL serosanguineous drainage in canister  Orthopedic: Status post right foot TMA open with wound VAC application   Radiographs: Status post 1st through 5th transmetatarsal amputation there is no acute fracture or dislocation wound VAC in place.  Pathology: Pending  Micro: Culture reintubated for better growth  Assessment:   Gangrene of right foot as well as osteomyelitis status post  TMA  Plan:  Patient was evaluated and treated and all questions answered.  POD # 1 s/p right foot transmetatarsal amputation open with wound VAC -Progressing as expected postoperatively -Plan return to the operating room Monday morning for repeat debridement and irrigation with  delayed primary closure of surgical site.  N.p.o. past midnight Sunday night -XR: Expected postoperative changes -WB Status: Nonweightbearing in postop shoe -Medications/ABX: Continue antibiotics per infectious disease recommendations - If patient is resumed on heparin  drip will need to be held 4 hours preoperatively so approximately 0 330 Monday morning - VAC to remain clean dry and intact and functioning until next surgery         Maridee Shoemaker, DPM [facility] / [facility]

## 2023-06-15 NOTE — Progress Notes (Signed)
 PHARMACY - ANTICOAGULATION CONSULT NOTE  Pharmacy Consult for heparin  drip Indication: Severe PAD   No Known Allergies  Patient Measurements: Height: 6' (182.9 cm) Weight: 108 kg (238 lb 1.6 oz) IBW/kg (Calculated) : 77.6 HEPARIN  DW (KG): 100.3  Vital Signs: Temp: 98.5 F (36.9 C) (05/03 0203) Temp Source: Oral (05/03 0203) BP: 115/68 (05/03 0203) Pulse Rate: 70 (05/03 0203)  Labs: Recent Labs    06/12/23 1209 06/12/23 1836 06/13/23 0514 06/13/23 1627 06/14/23 2022 06/15/23 0410  HGB  --   --  10.5*  --   --  10.5*  HCT  --   --  31.1*  --   --  32.1*  PLT  --   --  326  --   --  349  APTT 30  --   --   --   --   --   LABPROT 16.1*  --   --   --   --   --   INR 1.3*  --   --   --   --   --   HEPARINUNFRC  --    < > 0.19* <0.10* 0.33 0.41   < > = values in this interval not displayed.    Estimated Creatinine Clearance: 74 mL/min (by C-G formula based on SCr of 1.18 mg/dL).   Medical History: Past Medical History:  Diagnosis Date   Arthritis    CHF (congestive heart failure) (HCC)    Diabetes mellitus without complication (HCC)    Erectile dysfunction    History of kidney stones    Hypertension    Sleep apnea     Medications:  Medications Prior to Admission  Medication Sig Dispense Refill Last Dose/Taking   acetaminophen  (TYLENOL ) 325 MG tablet Take 650 mg by mouth every 6 (six) hours as needed for mild pain, headache, fever or moderate pain.   Taking As Needed   ASPIRIN  81 PO Take by mouth daily.   Past Week   atorvastatin  (LIPITOR) 40 MG tablet TAKE 1 TABLET BY MOUTH EVERY DAY *NEW PRESCRIPTION REQUEST* 90 tablet 1 Past Week   carvedilol  (COREG ) 3.125 MG tablet TAKE ONE (1) TABLET BY MOUTH TWICE DAILY *NEW PRESCRIPTION REQUEST* 180 tablet 1 Past Week   cholecalciferol (VITAMIN D) 25 MCG (1000 UT) tablet Take 1,000 Units by mouth daily.   Past Week   dapagliflozin  propanediol (FARXIGA ) 10 MG TABS tablet TAKE 1 TABLET BY MOUTH EVERY DAY BEFORE BREAKFAST  *NEW PRESCRIPTION REQUEST* 90 tablet 1 06/07/2023   Dulaglutide 1.5 MG/0.5ML SOPN Inject 1.5 mg into the skin once a week. Mondays   Taking   furosemide  (LASIX ) 40 MG tablet TAKE 1 TABLET BY MOUTH EVERY DAY *NEW PRESCRIPTION REQUEST* 90 tablet 1 Past Week   glipiZIDE (GLUCOTROL) 10 MG tablet Take 10 mg by mouth daily before breakfast.   06/06/2023   insulin  glargine (LANTUS ) 100 UNIT/ML Solostar Pen Inject 40-80 Units into the skin at bedtime.   06/06/2023   Niacin, Antihyperlipidemic, 500 MG TABS Take 500 mg by mouth daily with breakfast.   Past Week   sacubitril -valsartan  (ENTRESTO ) 97-103 MG Take 1 tablet by mouth 2 (two) times daily. 180 tablet 1 06/07/2023 Morning   spironolactone  (ALDACTONE ) 25 MG tablet TAKE 1 TABLET BY MOUTH EVERY DAY *NEW PRESCRIPTION REQUEST* 90 tablet 1 Past Week   tadalafil (CIALIS) 5 MG tablet Take by mouth daily as needed for erectile dysfunction.   Taking As Needed   vitamin B-12 (CYANOCOBALAMIN) 500 MCG tablet Take 500  mcg by mouth daily.   06/07/2023    Assessment: 73 yo male to start heparin  drip for severe PAD with anticipated angiogram on 5/1 Hx: arthritis, CHF, diabetes mellitus 2, kidney stones, hypertension and sleep apnea  On Lovenox  50 mg q24h for DVT ppx with last dose 4/29@2123  30s   INR 1.3  0502 Heparin  drip to resume 4 hours after surgery, no bolus  Date Time HL Rate/Comment 05/02 2022 0.33 Therapeutic x1 05/03 0410 0.41 Therapeutic x 2  Goal of Therapy:  Heparin  level 0.3-0.7 units/ml Monitor platelets by anticoagulation protocol: Yes   Plan:  Continue heparin  infusion at 2350 units/hr Recheck HL daily w/ AM labs while therapeutic Monitor daily heparin  levels while on heparin  infusion Continue to monitor H&H and platelets  Thank you for involving pharmacy in this patient's care.   Coretta Dexter, PharmD, [facility] 06/15/2023 5:45 AM

## 2023-06-15 NOTE — Evaluation (Signed)
 Physical Therapy Re-Evaluation Patient Details Name: Scott Hale MRN: [id number] DOB: 01/25/1951 Today's Date: 06/15/2023  History of Present Illness  Scott Hale is a 73 yo M who comes to [facility] after progression in Rt foot pain and onset redness, podiatry consulted. Pt diagnosed with gangrene and osteomyelitis of the right forefoot and is s/p R foot TMA with wound vac placement.  MD assessment also includes AKI, severe sepsis, and hyponatremia.   Clinical Impression  Pt was pleasant and motivated to participate during the session and put forth good effort throughout. Pt required no assist with bed mobility tasks and presented with good static sitting balance at the EOB.  Pt given multi-modal cuing for proper sequencing with transfers to maintain RLE NWB status with pt's lower R leg held out anteriorly by this PT prior to standing to ensure compliance.  Pt able to come to standing with UE assist and increased effort but with good stability and once in standing was able to take hop-to steps forwards and backwards and turn 180 deg with good stability and WB compliance throughout. Pt will benefit from continued PT services upon discharge to safely address deficits listed in patient problem list for decreased caregiver assistance and eventual return to PLOF.          If plan is discharge home, recommend the following: Assist for transportation;Assistance with cooking/housework;A little help with walking and/or transfers;A little help with bathing/dressing/bathroom;Help with stairs or ramp for entrance   Can travel by private vehicle        Equipment Recommendations Rolling walker (2 wheels);BSC/3in1  Recommendations for Other Services       Functional Status Assessment Patient has had a recent decline in their functional status and demonstrates the ability to make significant improvements in function in a reasonable and predictable amount of time.     Precautions / Restrictions  Precautions Precautions: Fall Recall of Precautions/Restrictions: Intact Restrictions Weight Bearing Restrictions Per Provider Order: Yes RLE Weight Bearing Per Provider Order: Non weight bearing Other Position/Activity Restrictions: RLE NWB with post op shoe donned      Mobility  Bed Mobility Overal bed mobility: Modified Independent Bed Mobility: Supine to Sit     Supine to sit: Supervision     General bed mobility comments: Min extra time and effort but no physical assist needed    Transfers Overall transfer level: Needs assistance Equipment used: Rolling walker (2 wheels) Transfers: Sit to/from Stand Sit to Stand: Contact guard assist, From elevated surface           General transfer comment: Mod multi modal cues with pt's RLE held out anteriorly by this PT to ensure WB compliance; pt able to stand with min extra time and effort but no assist needed    Ambulation/Gait Ambulation/Gait assistance: Contact guard assist Gait Distance (Feet): 5 Feet Assistive device: Rolling walker (2 wheels)   Gait velocity: decreased     General Gait Details: Hop-to pattern visual education followed by practice with pt able to manage limited forward/backward hoping and 180 turns inside the RW with no LOB and with good WB compliance  Stairs            Wheelchair Mobility     Tilt Bed    Modified Rankin (Stroke Patients Only)       Balance Overall balance assessment: Needs assistance   Sitting balance-Leahy Scale: Normal     Standing balance support: Bilateral upper extremity supported, During functional activity Standing balance-Leahy Scale: Good  Pertinent Vitals/Pain Pain Assessment Pain Assessment: No/denies pain    Home Living Family/patient expects to be discharged to:: Private residence (Lives at friend Norma's house) Living Arrangements: Non-relatives/Friends Available Help at Discharge: Friend(s);Available  24 hours/day Type of Home: House Home Access: Stairs to enter Entrance Stairs-Rails: None Entrance Stairs-Number of Steps: 1   Home Layout: Two level;Able to live on main level with bedroom/bathroom Home Equipment: None      Prior Function               Mobility Comments: Ind amb community distances without AD, no fall history, was going to gym weekly ADLs Comments: Ind with ADLs     Extremity/Trunk Assessment   Upper Extremity Assessment Upper Extremity Assessment: Overall WFL for tasks assessed    Lower Extremity Assessment Lower Extremity Assessment: Generalized weakness       Communication   Communication Communication: No apparent difficulties    Cognition Arousal: Alert Behavior During Therapy: WFL for tasks assessed/performed   PT - Cognitive impairments: No apparent impairments                         Following commands: Intact       Cueing Cueing Techniques: Verbal cues, Visual cues     General Comments      Exercises     Assessment/Plan    PT Assessment Patient needs continued PT services  PT Problem List Decreased strength;Decreased activity tolerance;Decreased balance;Decreased mobility;Decreased knowledge of use of DME;Decreased knowledge of precautions       PT Treatment Interventions DME instruction;Gait training;Stair training;Functional mobility training;Therapeutic activities;Therapeutic exercise;Balance training;Patient/family education    PT Goals (Current goals can be found in the Care Plan section)  Acute Rehab PT Goals Patient Stated Goal: To get back to working out at the gym PT Goal Formulation: With patient Time For Goal Achievement: 06/28/23 Potential to Achieve Goals: Good    Frequency 7X/week     Co-evaluation               AM-PAC PT "6 Clicks" Mobility  Outcome Measure Help needed turning from your back to your side while in a flat bed without using bedrails?: A Little Help needed moving from  lying on your back to sitting on the side of a flat bed without using bedrails?: A Little Help needed moving to and from a bed to a chair (including a wheelchair)?: A Little Help needed standing up from a chair using your arms (e.g., wheelchair or bedside chair)?: A Little Help needed to walk in hospital room?: A Little Help needed climbing 3-5 steps with a railing? : A Lot 6 Click Score: 17    End of Session Equipment Utilized During Treatment: Gait belt Activity Tolerance: Patient tolerated treatment well Patient left: in chair;with call bell/phone within reach;with family/visitor present;with chair alarm set Nurse Communication: Mobility status;Weight bearing status (Some blood noted on ace bandage near incision prior to session, nursing entered room to assess prior to mobility) PT Visit Diagnosis: Other abnormalities of gait and mobility (R26.89);Muscle weakness (generalized) (M62.81)    Time: 1610-9604 PT Time Calculation (min) (ACUTE ONLY): 26 min   Charges:   PT Evaluation $PT Re-evaluation: 1 Re-eval PT Treatments $Gait Training: 8-22 mins PT General Charges $$ ACUTE PT VISIT: 1 Visit    D. Madalyn Scarce PT, DPT 06/15/23, 3:36 PM

## 2023-06-15 NOTE — Progress Notes (Signed)
 PROGRESS NOTE    Scott Hale  UEA:[id number] DOB: 23-Feb-1950 DOA: 06/07/2023 PCP: Lyle San, MD    Brief Narrative:   Scott Hale is a 73 y.o. male with medical history significant of DM2, HTN, CKD who presented with right foot pain.   Underwent transmetatarsal amputation of right foot and wound VAC application on 5/2   Assessment & Plan:   Principal Problem:   Cellulitis Active Problems:   Gangrene of toe of right foot (HCC)   PAD (peripheral artery disease) (HCC)   Cellulitis of right foot   Diabetic foot infection (HCC)   Gangrene of right foot (HCC)  RLE cellulitis  Possible osteomyelitis and septic arthritis --received vanc/cefe/flagyl  in the ED, then switched to Ancef .  No open wounds on presentation, but wounds opened up in between toes on 4/28.  ID and podiatry consulted.  Abx switched to abx to Unasyn  and Zyvox , per ID. --MRI right foot showed multiple areas of osteomyelitis Plan: Continue IV antibiotics per ID recommendation, currently Zosyn  and Zyvox  Postoperative care status post TMA Local wound care Podiatry to return to OR Monday for washout and wound closure attempt  PAD --ABI with Findings indicative of moderate right and mild left lower extremity peripheral arterial disease.  --Vascular surgery consulted --S/p angio with stent placement x 2 Plan: Continue hep gtt   Severe Sepsis --fever, leukocytosis, with AKI, source cellulitis   DM2 --recent A1c 7.6.  Pt reported taking Levemir 80u nightly at home. --Continue glargine to 45u nightly --ACHS and SSI   HTN --resume coreg    AKI on CKD 3a --Cr 1.85 on presentation, improved to 1.37 with IVF. --oral hydration now   Hyponatremia --of unclear significance   DVT prophylaxis: hep gtt Code Status: Full Family Communication: Friend at bedside 4/30, 5/1 Disposition Plan: Status is: Inpatient Remains inpatient appropriate because: Lower extremity osteomyelitis   Level of care:  Med-Surg  Consultants:  ID Podiatry Vascular surgery  Procedures:  Lower extremity angiogram 5/1 Right TMA 5/2  Antimicrobials: Unasyn  Zyvox    Subjective: Seen and examined.  In good spirits.  No complaints  Objective: Vitals:   06/14/23 1653 06/14/23 2020 06/15/23 0203 06/15/23 0750  BP: 113/66 (!) 101/59 115/68 136/68  Pulse: 67 69 70 (!) 55  Resp: 18 20 18 16   Temp: 98.9 F (37.2 C) 98.1 F (36.7 C) 98.5 F (36.9 C) 97.8 F (36.6 C)  TempSrc: Oral Oral Oral   SpO2: 100% 98% 98% 97%  Weight:      Height:        Intake/Output Summary (Last 24 hours) at 06/15/2023 1029 Last data filed at 06/15/2023 0800 Gross per 24 hour  Intake 92.26 ml  Output 1600 ml  Net -1507.74 ml   Filed Weights   06/07/23 1453 06/13/23 0927 06/14/23 0739  Weight: 108 kg 108 kg 108 kg    Examination:  General exam: NAD Respiratory system: Lungs clear.  Normal work of breathing to room air Cardiovascular system: S1-S2, RRR, no murmurs, no pedal edema Gastrointestinal system: Soft, NT/ND, normal bowel Central nervous system: Alert and oriented. No focal neurological deficits. Extremities: Status post right TMA Skin: No rashes, lesions or ulcers Psychiatry: Judgement and insight appear normal. Mood & affect appropriate.     Data Reviewed: I have personally reviewed following labs and imaging studies  CBC: Recent Labs  Lab 06/10/23 0530 06/11/23 0315 06/12/23 0508 06/13/23 0514 06/15/23 0410  WBC 16.4* 18.1* 18.8* 17.2* 17.5*  HGB 11.4* 11.6* 11.0* 10.5* 10.5*  HCT  32.9* 34.1* 32.3* 31.1* 32.1*  MCV 87.5 89.3 89.0 90.4 91.2  PLT 283 281 331 326 349   Basic Metabolic Panel: Recent Labs  Lab 06/09/23 0454 06/10/23 0530 06/11/23 0315 06/12/23 0508 06/14/23 2138  NA 131* 136 131* 134*  --   K 3.6 3.8 4.2 4.2  --   CL 99 101 96* 99  --   CO2 23 26 25 25   --   GLUCOSE 111* 64* 62* 100* 442*  BUN 12 10 10 11   --   CREATININE 1.37* 1.34* 1.27* 1.18  --   CALCIUM  8.4*  8.3* 8.8* 8.8*  --    GFR: Estimated Creatinine Clearance: 74 mL/min (by C-G formula based on SCr of 1.18 mg/dL). Liver Function Tests: No results for input(s): "AST", "ALT", "ALKPHOS", "BILITOT", "PROT", "ALBUMIN" in the last 168 hours. No results for input(s): "LIPASE", "AMYLASE" in the last 168 hours. No results for input(s): "AMMONIA" in the last 168 hours. Coagulation Profile: Recent Labs  Lab 06/12/23 1209  INR 1.3*   Cardiac Enzymes: No results for input(s): "CKTOTAL", "CKMB", "CKMBINDEX", "TROPONINI" in the last 168 hours. BNP (last 3 results) No results for input(s): "PROBNP" in the last 8760 hours. HbA1C: No results for input(s): "HGBA1C" in the last 72 hours.  CBG: Recent Labs  Lab 06/14/23 0916 06/14/23 1237 06/14/23 1657 06/14/23 2106 06/15/23 0752  GLUCAP 119* 261* 341* 424* 228*   Lipid Profile: No results for input(s): "CHOL", "HDL", "LDLCALC", "TRIG", "CHOLHDL", "LDLDIRECT" in the last 72 hours. Thyroid Function Tests: No results for input(s): "TSH", "T4TOTAL", "FREET4", "T3FREE", "THYROIDAB" in the last 72 hours. Anemia Panel: No results for input(s): "VITAMINB12", "FOLATE", "FERRITIN", "TIBC", "IRON", "RETICCTPCT" in the last 72 hours. Sepsis Labs: No results for input(s): "PROCALCITON", "LATICACIDVEN" in the last 168 hours.   Recent Results (from the past 240 hours)  Resp panel by RT-PCR (RSV, Flu A&B, Covid) Anterior Nasal Swab     Status: None   Collection Time: 06/07/23  4:53 PM   Specimen: Anterior Nasal Swab  Result Value Ref Range Status   SARS Coronavirus 2 by RT PCR NEGATIVE NEGATIVE Final    Comment: (NOTE) SARS-CoV-2 target nucleic acids are NOT DETECTED.  The SARS-CoV-2 RNA is generally detectable in upper respiratory specimens during the acute phase of infection. The lowest concentration of SARS-CoV-2 viral copies this assay can detect is 138 copies/mL. A negative result does not preclude SARS-Cov-2 infection and should not be used  as the sole basis for treatment or other patient management decisions. A negative result may occur with  improper specimen collection/handling, submission of specimen other than nasopharyngeal swab, presence of viral mutation(s) within the areas targeted by this assay, and inadequate number of viral copies(<138 copies/mL). A negative result must be combined with clinical observations, patient history, and epidemiological information. The expected result is Negative.  Fact Sheet for Patients:  BloggerCourse.com  Fact Sheet for Healthcare Providers:  SeriousBroker.it  This test is no t yet approved or cleared by the [place]  FDA and  has been authorized for detection and/or diagnosis of SARS-CoV-2 by FDA under an Emergency Use Authorization (EUA). This EUA will remain  in effect (meaning this test can be used) for the duration of the COVID-19 declaration under Section 564(b)(1) of the Act, 21 U.S.C.section 360bbb-3(b)(1), unless the authorization is terminated  or revoked sooner.       Influenza A by PCR NEGATIVE NEGATIVE Final   Influenza B by PCR NEGATIVE NEGATIVE Final    Comment: (  NOTE) The Xpert Xpress SARS-CoV-2/FLU/RSV plus assay is intended as an aid in the diagnosis of influenza from Nasopharyngeal swab specimens and should not be used as a sole basis for treatment. Nasal washings and aspirates are unacceptable for Xpert Xpress SARS-CoV-2/FLU/RSV testing.  Fact Sheet for Patients: BloggerCourse.com  Fact Sheet for Healthcare Providers: SeriousBroker.it  This test is not yet approved or cleared by the [place]  FDA and has been authorized for detection and/or diagnosis of SARS-CoV-2 by FDA under an Emergency Use Authorization (EUA). This EUA will remain in effect (meaning this test can be used) for the duration of the COVID-19 declaration under Section 564(b)(1)  of the Act, 21 U.S.C. section 360bbb-3(b)(1), unless the authorization is terminated or revoked.     Resp Syncytial Virus by PCR NEGATIVE NEGATIVE Final    Comment: (NOTE) Fact Sheet for Patients: BloggerCourse.com  Fact Sheet for Healthcare Providers: SeriousBroker.it  This test is not yet approved or cleared by the [place]  FDA and has been authorized for detection and/or diagnosis of SARS-CoV-2 by FDA under an Emergency Use Authorization (EUA). This EUA will remain in effect (meaning this test can be used) for the duration of the COVID-19 declaration under Section 564(b)(1) of the Act, 21 U.S.C. section 360bbb-3(b)(1), unless the authorization is terminated or revoked.  Performed at [facility], [address] [place], [place] [address]   Culture, blood (routine x 2)     Status: None   Collection Time: 06/07/23  5:25 PM   Specimen: BLOOD  Result Value Ref Range Status   Specimen Description BLOOD RIGHT ANTECUBITAL  Final   Special Requests   Final    BOTTLES DRAWN AEROBIC AND ANAEROBIC Blood Culture results may not be optimal due to an inadequate volume of blood received in culture bottles   Culture   Final    NO GROWTH 5 DAYS Performed at [facility], [address] [place], [place] [address]    Report Status 06/12/2023 FINAL  Final  Culture, blood (routine x 2)     Status: None   Collection Time: 06/07/23  5:27 PM   Specimen: BLOOD  Result Value Ref Range Status   Specimen Description BLOOD LEFT ANTECUBITAL  Final   Special Requests   Final    BOTTLES DRAWN AEROBIC AND ANAEROBIC Blood Culture results may not be optimal due to an inadequate volume of blood received in culture bottles   Culture   Final    NO GROWTH 5 DAYS Performed at [facility], [address], [place], [place] [address]    Report Status 06/12/2023 FINAL  Final  Aerobic Culture w Gram Stain (superficial  specimen)     Status: None   Collection Time: 06/10/23  7:35 PM   Specimen: Wound  Result Value Ref Range Status   Specimen Description   Final    WOUND Performed at [facility], [address], [place], [place] [address]    Special Requests   Final    RIGHT FOOT Performed at [facility], [address] [place], [place] [address]    Gram Stain NO WBC SEEN FEW GRAM POSITIVE COCCI IN PAIRS   Final   Culture   Final    RARE GROUP B STREP(S.AGALACTIAE)ISOLATED TESTING AGAINST S. AGALACTIAE NOT ROUTINELY PERFORMED DUE TO PREDICTABILITY OF AMP/PEN/VAN SUSCEPTIBILITY. WITHIN MIXED NORMAL SKIN FLORA Performed at [facility], [address] [place], [place] [address]    Report Status 06/13/2023 FINAL  Final  Aerobic/Anaerobic Culture w Gram Stain (surgical/deep wound)     Status: None (Preliminary result)   Collection Time: 06/14/23  8:45 AM   Specimen: Foot, Right; Amputation  Result Value Ref Range Status   Specimen Description   Final    TISSUE Performed at [facility], [address], [place], [place] [address]    Special Requests   Final    RIGHT FOOT TISSUE CULTURE Performed at [facility], [address] [place], [place] [address]    Gram Stain   Final    FEW WBC PRESENT, PREDOMINANTLY PMN NO ORGANISMS SEEN    Culture   Final    CULTURE REINCUBATED FOR BETTER GROWTH Performed at [facility], [address] [place], [place] [address]    Report Status PENDING  Incomplete  Aerobic/Anaerobic Culture w Gram Stain (surgical/deep wound)     Status: None (Preliminary result)   Collection Time: 06/14/23  8:52 AM   Specimen: Foot, Right; Blood  Result Value Ref Range Status   Specimen Description   Final    FOOT Performed at [facility], [address] [place], [place] [address]    Special Requests RIGHT FOOT  Final   Gram Stain NO WBC SEEN NO ORGANISMS SEEN   Final   Culture   Final    CULTURE  REINCUBATED FOR BETTER GROWTH Performed at [facility], [address], [place], [place] [address]    Report Status PENDING  Incomplete         Radiology Studies: DG Foot 2 Views Right Result Date: 06/14/2023 CLINICAL DATA:  Most of radiograph. EXAM: RIGHT FOOT - 2 VIEW COMPARISON:  Right foot radiograph dated 06/07/2023. FINDINGS: Status post 1st-5th transmetatarsal amputation. There is no acute fracture or dislocation. The bones are well mineralized. Postsurgical changes of the soft tissues of the stump and a wound VAC. IMPRESSION: Transmetatarsal amputation. Electronically Signed   By: Angus Bark M.D.   On: 06/14/2023 13:52   PERIPHERAL VASCULAR CATHETERIZATION Result Date: 06/13/2023 See surgical note for result.       Scheduled Meds:  atorvastatin   40 mg Oral Daily   carvedilol   3.125 mg Oral BID WC   furosemide   40 mg Oral Daily   insulin  aspart  0-9 Units Subcutaneous TID WC   insulin  glargine-yfgn  45 Units Subcutaneous QHS   sacubitril -valsartan   1 tablet Oral BID   spironolactone   25 mg Oral Daily   Continuous Infusions:  heparin  2,350 Units/hr (06/15/23 0128)   linezolid  (ZYVOX ) IV 600 mg (06/15/23 0948)   piperacillin -tazobactam (ZOSYN )  IV 3.375 g (06/15/23 0533)     LOS: 7 days    Tiajuana Fluke, MD [facility]   If 7PM-7AM, please contact night-coverage  06/15/2023, 10:29 AM

## 2023-06-16 DIAGNOSIS — L03115 Cellulitis of right lower limb: Secondary | ICD-10-CM | POA: Diagnosis not present

## 2023-06-16 LAB — CBC
HCT: 30.8 % — ABNORMAL LOW (ref 39.0–52.0)
Hemoglobin: 10.2 g/dL — ABNORMAL LOW (ref 13.0–17.0)
MCH: 30.2 pg (ref 26.0–34.0)
MCHC: 33.1 g/dL (ref 30.0–36.0)
MCV: 91.1 fL (ref 80.0–100.0)
Platelets: 353 10*3/uL (ref 150–400)
RBC: 3.38 MIL/uL — ABNORMAL LOW (ref 4.22–5.81)
RDW: 14 % (ref 11.5–15.5)
WBC: 14 10*3/uL — ABNORMAL HIGH (ref 4.0–10.5)
nRBC: 0 % (ref 0.0–0.2)

## 2023-06-16 LAB — GLUCOSE, CAPILLARY
Glucose-Capillary: 126 mg/dL — ABNORMAL HIGH (ref 70–99)
Glucose-Capillary: 197 mg/dL — ABNORMAL HIGH (ref 70–99)
Glucose-Capillary: 138 mg/dL — ABNORMAL HIGH (ref 70–99)

## 2023-06-16 LAB — HEMOGLOBIN AND HEMATOCRIT, BLOOD
HCT: 32.1 % — ABNORMAL LOW (ref 39.0–52.0)
HCT: 32.4 % — ABNORMAL LOW (ref 39.0–52.0)
Hemoglobin: 10.6 g/dL — ABNORMAL LOW (ref 13.0–17.0)
Hemoglobin: 10.6 g/dL — ABNORMAL LOW (ref 13.0–17.0)

## 2023-06-16 LAB — HEPARIN LEVEL (UNFRACTIONATED): Heparin Unfractionated: 0.49 [IU]/mL (ref 0.30–0.70)

## 2023-06-16 NOTE — Progress Notes (Signed)
 OT Cancellation Note  Patient Details Name: Jaiver Deni MRN: [id number] DOB: 1950/07/07   Cancelled Treatment:    Reason Eval/Treat Not Completed: Medical issues which prohibited therapy. Per nurse pt's foot bleeding a lot this AM and awaiting MD to see and may check Hgb. Pt hasplanned sx tomorrow to close wound as well and will need a new order following this. Will hold OT eval this date and re-attempt as appropriate.  Loys Hoselton E Brittanyann Wittner 06/16/2023, 9:24 AM

## 2023-06-16 NOTE — Plan of Care (Signed)
   Problem: Education: Goal: Ability to describe self-care measures that may prevent or decrease complications (Diabetes Survival Skills Education) will improve Outcome: Progressing   Problem: Coping: Goal: Ability to adjust to condition or change in health will improve Outcome: Progressing   Problem: Fluid Volume: Goal: Ability to maintain a balanced intake and output will improve Outcome: Progressing

## 2023-06-16 NOTE — Progress Notes (Signed)
 The patient was seen and examined for bleeding right foot stump with wound VAC attached. Right foot dressing was soaked with blood. H and H have been stable today. Heparin  was stopped. Pressure has been applied with dressings changes when soaked. Dr. Rosemarie Conquest will be notified. We will obtain that follow up H&H and continue monitoring. Plan was discussed with the patient and he expressed understanding.

## 2023-06-16 NOTE — Progress Notes (Signed)
 PT Cancellation Note  Patient Details Name: Damarkus Pagano MRN: [id number] DOB: [birth year]   Cancelled Treatment:    Reason Eval/Treat Not Completed: Other (comment): Pt to be held this date with foot bleeding this AM and awaiting MD to see and may check Hgb. Pt has planned sx tomorrow to close wound as well and will need a new order following this. Will hold PT this date and re-attempt as appropriate.   Lavenia Post PT, DPT 06/16/23, 10:28 AM

## 2023-06-16 NOTE — Progress Notes (Signed)
 PROGRESS NOTE    Scott Hale  RUE:[id number] DOB: 1950-08-20 DOA: 06/07/2023 PCP: Lyle San, MD    Brief Narrative:   Scott Hale is a 73 y.o. male with medical history significant of DM2, HTN, CKD who presented with right foot pain.   Underwent transmetatarsal amputation of right foot and wound VAC application on 5/2  5/4: Bleeding noted from surgical site.  Hemoglobin relatively stable.  Vital signs remained stable.  Patient feels okay   Assessment & Plan:   Principal Problem:   Cellulitis Active Problems:   Gangrene of toe of right foot (HCC)   PAD (peripheral artery disease) (HCC)   Cellulitis of right foot   Diabetic foot infection (HCC)   Gangrene of right foot (HCC)  RLE cellulitis  Possible osteomyelitis and septic arthritis Postoperative bleeding --received vanc/cefe/flagyl  in the ED, then switched to Ancef .  No open wounds on presentation, but wounds opened up in between toes on 4/28.  ID and podiatry consulted.  Abx switched to abx to Unasyn  and Zyvox , per ID. --MRI right foot showed multiple areas of osteomyelitis -- Bleeding noted from surgical site 5/4 AM.  Heparin  GTT stopped Plan: Continue IV antibiotics per ID recommendation, currently Zosyn  and Zyvox  Postoperative care status post TMA Local wound care Podiatry follow-up.  Plan to return to the OR 5/5  PAD --ABI with Findings indicative of moderate right and mild left lower extremity peripheral arterial disease.  --Vascular surgery consulted --S/p angio with stent placement x 2 Plan: Heparin  GTT currently on hold   Severe Sepsis --fever, leukocytosis, with AKI, source cellulitis --On IV antibiotics as above   DM2 --recent A1c 7.6.  Pt reported taking Levemir 80u nightly at home. --Continue glargine to 45u nightly --ACHS and SSI   HTN --resume coreg    AKI on CKD 3a --Cr 1.85 on presentation, improved to 1.37 with IVF. --oral hydration now   Hyponatremia --of unclear  significance   DVT prophylaxis: hep gtt (on hold) Code Status: Full Family Communication: Friend at bedside 4/30, 5/1 Disposition Plan: Status is: Inpatient Remains inpatient appropriate because: Lower extremity osteomyelitis   Level of care: Med-Surg  Consultants:  ID Podiatry Vascular surgery  Procedures:  Lower extremity angiogram 5/1 Right TMA 5/2  Antimicrobials: Unasyn  Zyvox    Subjective: Seen and examined.  No complaints.  Feels well overall.  Objective: Vitals:   06/15/23 1622 06/15/23 2110 06/16/23 0534 06/16/23 0750  BP: 122/70 (!) 108/56 97/65 112/62  Pulse: (!) 59 63 (!) 56 (!) 57  Resp: 18 20 18 16   Temp: 98.1 F (36.7 C) 98.2 F (36.8 C) 97.9 F (36.6 C) 97.7 F (36.5 C)  TempSrc:   Oral Oral  SpO2: 98% 98% 99% 100%  Weight:      Height:        Intake/Output Summary (Last 24 hours) at 06/16/2023 1035 Last data filed at 06/16/2023 0946 Gross per 24 hour  Intake 907.85 ml  Output --  Net 907.85 ml   Filed Weights   06/07/23 1453 06/13/23 0927 06/14/23 0739  Weight: 108 kg 108 kg 108 kg    Examination:  General exam: No acute distress Respiratory system: Lungs clear.  Normal work of breathing to room air Cardiovascular system: S1-S2, RRR, no murmurs, no pedal edema Gastrointestinal system: Soft, NT/ND, normal bowel Central nervous system: Alert and oriented. No focal neurological deficits. Extremities: Status post right TMA, bleeding from surgical site Skin: No rashes, lesions or ulcers Psychiatry: Judgement and insight appear normal. Mood & affect  appropriate.     Data Reviewed: I have personally reviewed following labs and imaging studies  CBC: Recent Labs  Lab 06/11/23 0315 06/12/23 0508 06/13/23 0514 06/15/23 0410 06/16/23 0241  WBC 18.1* 18.8* 17.2* 17.5* 14.0*  HGB 11.6* 11.0* 10.5* 10.5* 10.2*  HCT 34.1* 32.3* 31.1* 32.1* 30.8*  MCV 89.3 89.0 90.4 91.2 91.1  PLT 281 331 326 349 353   Basic Metabolic Panel: Recent  Labs  Lab 06/10/23 0530 06/11/23 0315 06/12/23 0508 06/14/23 2138  NA 136 131* 134*  --   K 3.8 4.2 4.2  --   CL 101 96* 99  --   CO2 26 25 25   --   GLUCOSE 64* 62* 100* 442*  BUN 10 10 11   --   CREATININE 1.34* 1.27* 1.18  --   CALCIUM  8.3* 8.8* 8.8*  --    GFR: Estimated Creatinine Clearance: 74 mL/min (by C-G formula based on SCr of 1.18 mg/dL). Liver Function Tests: No results for input(s): "AST", "ALT", "ALKPHOS", "BILITOT", "PROT", "ALBUMIN" in the last 168 hours. No results for input(s): "LIPASE", "AMYLASE" in the last 168 hours. No results for input(s): "AMMONIA" in the last 168 hours. Coagulation Profile: Recent Labs  Lab 06/12/23 1209  INR 1.3*   Cardiac Enzymes: No results for input(s): "CKTOTAL", "CKMB", "CKMBINDEX", "TROPONINI" in the last 168 hours. BNP (last 3 results) No results for input(s): "PROBNP" in the last 8760 hours. HbA1C: No results for input(s): "HGBA1C" in the last 72 hours.  CBG: Recent Labs  Lab 06/15/23 0752 06/15/23 1205 06/15/23 1621 06/15/23 2124 06/16/23 0752  GLUCAP 228* 295* 302* 237* 126*   Lipid Profile: No results for input(s): "CHOL", "HDL", "LDLCALC", "TRIG", "CHOLHDL", "LDLDIRECT" in the last 72 hours. Thyroid Function Tests: No results for input(s): "TSH", "T4TOTAL", "FREET4", "T3FREE", "THYROIDAB" in the last 72 hours. Anemia Panel: No results for input(s): "VITAMINB12", "FOLATE", "FERRITIN", "TIBC", "IRON", "RETICCTPCT" in the last 72 hours. Sepsis Labs: No results for input(s): "PROCALCITON", "LATICACIDVEN" in the last 168 hours.   Recent Results (from the past 240 hours)  Resp panel by RT-PCR (RSV, Flu A&B, Covid) Anterior Nasal Swab     Status: None   Collection Time: 06/07/23  4:53 PM   Specimen: Anterior Nasal Swab  Result Value Ref Range Status   SARS Coronavirus 2 by RT PCR NEGATIVE NEGATIVE Final    Comment: (NOTE) SARS-CoV-2 target nucleic acids are NOT DETECTED.  The SARS-CoV-2 RNA is generally  detectable in upper respiratory specimens during the acute phase of infection. The lowest concentration of SARS-CoV-2 viral copies this assay can detect is 138 copies/mL. A negative result does not preclude SARS-Cov-2 infection and should not be used as the sole basis for treatment or other patient management decisions. A negative result may occur with  improper specimen collection/handling, submission of specimen other than nasopharyngeal swab, presence of viral mutation(s) within the areas targeted by this assay, and inadequate number of viral copies(<138 copies/mL). A negative result must be combined with clinical observations, patient history, and epidemiological information. The expected result is Negative.  Fact Sheet for Patients:  BloggerCourse.com  Fact Sheet for Healthcare Providers:  SeriousBroker.it  This test is no t yet approved or cleared by the [place]  FDA and  has been authorized for detection and/or diagnosis of SARS-CoV-2 by FDA under an Emergency Use Authorization (EUA). This EUA will remain  in effect (meaning this test can be used) for the duration of the COVID-19 declaration under Section 564(b)(1) of the Act,  21 U.S.C.section 360bbb-3(b)(1), unless the authorization is terminated  or revoked sooner.       Influenza A by PCR NEGATIVE NEGATIVE Final   Influenza B by PCR NEGATIVE NEGATIVE Final    Comment: (NOTE) The Xpert Xpress SARS-CoV-2/FLU/RSV plus assay is intended as an aid in the diagnosis of influenza from Nasopharyngeal swab specimens and should not be used as a sole basis for treatment. Nasal washings and aspirates are unacceptable for Xpert Xpress SARS-CoV-2/FLU/RSV testing.  Fact Sheet for Patients: BloggerCourse.com  Fact Sheet for Healthcare Providers: SeriousBroker.it  This test is not yet approved or cleared by the [place]  FDA  and has been authorized for detection and/or diagnosis of SARS-CoV-2 by FDA under an Emergency Use Authorization (EUA). This EUA will remain in effect (meaning this test can be used) for the duration of the COVID-19 declaration under Section 564(b)(1) of the Act, 21 U.S.C. section 360bbb-3(b)(1), unless the authorization is terminated or revoked.     Resp Syncytial Virus by PCR NEGATIVE NEGATIVE Final    Comment: (NOTE) Fact Sheet for Patients: BloggerCourse.com  Fact Sheet for Healthcare Providers: SeriousBroker.it  This test is not yet approved or cleared by the [place]  FDA and has been authorized for detection and/or diagnosis of SARS-CoV-2 by FDA under an Emergency Use Authorization (EUA). This EUA will remain in effect (meaning this test can be used) for the duration of the COVID-19 declaration under Section 564(b)(1) of the Act, 21 U.S.C. section 360bbb-3(b)(1), unless the authorization is terminated or revoked.  Performed at [facility], [address] [place], [place] [address]   Culture, blood (routine x 2)     Status: None   Collection Time: 06/07/23  5:25 PM   Specimen: BLOOD  Result Value Ref Range Status   Specimen Description BLOOD RIGHT ANTECUBITAL  Final   Special Requests   Final    BOTTLES DRAWN AEROBIC AND ANAEROBIC Blood Culture results may not be optimal due to an inadequate volume of blood received in culture bottles   Culture   Final    NO GROWTH 5 DAYS Performed at [facility], [address] [place], [place] [address]    Report Status 06/12/2023 FINAL  Final  Culture, blood (routine x 2)     Status: None   Collection Time: 06/07/23  5:27 PM   Specimen: BLOOD  Result Value Ref Range Status   Specimen Description BLOOD LEFT ANTECUBITAL  Final   Special Requests   Final    BOTTLES DRAWN AEROBIC AND ANAEROBIC Blood Culture results may not be optimal due to an inadequate  volume of blood received in culture bottles   Culture   Final    NO GROWTH 5 DAYS Performed at [facility], [address], [place], [place] [address]    Report Status 06/12/2023 FINAL  Final  Aerobic Culture w Gram Stain (superficial specimen)     Status: None   Collection Time: 06/10/23  7:35 PM   Specimen: Wound  Result Value Ref Range Status   Specimen Description   Final    WOUND Performed at [facility], [address], [place], [place] [address]    Special Requests   Final    RIGHT FOOT Performed at [facility], [address] [place], [place] [address]    Gram Stain NO WBC SEEN FEW GRAM POSITIVE COCCI IN PAIRS   Final   Culture   Final    RARE GROUP B STREP(S.AGALACTIAE)ISOLATED TESTING AGAINST S.  AGALACTIAE NOT ROUTINELY PERFORMED DUE TO PREDICTABILITY OF AMP/PEN/VAN SUSCEPTIBILITY. WITHIN MIXED NORMAL SKIN FLORA Performed at [facility], [address], [place], [place] [address]    Report Status 06/13/2023 FINAL  Final  Aerobic/Anaerobic Culture w Gram Stain (surgical/deep wound)     Status: None (Preliminary result)   Collection Time: 06/14/23  8:45 AM   Specimen: Foot, Right; Amputation  Result Value Ref Range Status   Specimen Description   Final    TISSUE Performed at [facility], [address] [place], [place] [address]    Special Requests   Final    RIGHT FOOT TISSUE CULTURE Performed at [facility], [address] [place], [place] [address]    Gram Stain   Final    FEW WBC PRESENT, PREDOMINANTLY PMN NO ORGANISMS SEEN Performed at [facility], [address], [place], [place] [address]    Culture   Final    RARE STAPHYLOCOCCUS AUREUS CULTURE REINCUBATED FOR BETTER GROWTH NO ANAEROBES ISOLATED; CULTURE IN PROGRESS FOR 5 DAYS    Report Status PENDING  Incomplete  Aerobic/Anaerobic Culture w Gram Stain (surgical/deep wound)     Status: None (Preliminary result)   Collection Time:  06/14/23  8:52 AM   Specimen: Foot, Right; Blood  Result Value Ref Range Status   Specimen Description   Final    FOOT Performed at [facility], [address], [place], [place] [address]    Special Requests RIGHT FOOT  Final   Gram Stain   Final    NO WBC SEEN NO ORGANISMS SEEN Performed at [facility], [address] [place], [place] [address]    Culture   Final    RARE STAPHYLOCOCCUS AUREUS CULTURE REINCUBATED FOR BETTER GROWTH NO ANAEROBES ISOLATED; CULTURE IN PROGRESS FOR 5 DAYS    Report Status PENDING  Incomplete         Radiology Studies: No results found.       Scheduled Meds:  atorvastatin   40 mg Oral Daily   carvedilol   3.125 mg Oral BID WC   furosemide   40 mg Oral Daily   insulin  aspart  0-9 Units Subcutaneous TID WC   insulin  glargine-yfgn  45 Units Subcutaneous QHS   sacubitril -valsartan   1 tablet Oral BID   spironolactone   25 mg Oral Daily   Continuous Infusions:  heparin  Stopped (06/16/23 0816)   linezolid  (ZYVOX ) IV 600 mg (06/16/23 0929)   piperacillin -tazobactam (ZOSYN )  IV 3.375 g (06/16/23 0535)     LOS: 8 days    Tiajuana Fluke, MD [facility]   If 7PM-7AM, please contact night-coverage  06/16/2023, 10:35 AM

## 2023-06-16 NOTE — Progress Notes (Signed)
 Paged podiatry. Nurse spoke with Dr. Rosemarie Conquest re: bleeding from right foot wound. Wound vac resumed as requested by MD.

## 2023-06-16 NOTE — Progress Notes (Signed)
 Patient has large amount of saturation to dressing on right foot and bedcovers. Nurse held heparin  drip and wound vac and notified MD. Dressing reinforced with ABD pads and ace wrap. Awaiting MD to assess and give further orders. Patient states he "feels okay" denies dizziness or chest pain at present. Nurse will continue to monitor.

## 2023-06-16 NOTE — Plan of Care (Signed)
  Problem: Education: Goal: Ability to describe self-care measures that may prevent or decrease complications (Diabetes Survival Skills Education) will improve Outcome: Progressing   Problem: Coping: Goal: Ability to adjust to condition or change in health will improve Outcome: Progressing   Problem: Fluid Volume: Goal: Ability to maintain a balanced intake and output will improve Outcome: Progressing   Problem: Health Behavior/Discharge Planning: Goal: Ability to identify and utilize available resources and services will improve Outcome: Progressing Goal: Ability to manage health-related needs will improve Outcome: Progressing   Problem: Metabolic: Goal: Ability to maintain appropriate glucose levels will improve Outcome: Progressing   Problem: Nutritional: Goal: Maintenance of adequate nutrition will improve Outcome: Progressing Goal: Progress toward achieving an optimal weight will improve Outcome: Progressing   Problem: Skin Integrity: Goal: Risk for impaired skin integrity will decrease Outcome: Progressing   Problem: Tissue Perfusion: Goal: Adequacy of tissue perfusion will improve Outcome: Progressing   Problem: Clinical Measurements: Goal: Ability to avoid or minimize complications of infection will improve Outcome: Progressing   Problem: Skin Integrity: Goal: Skin integrity will improve Outcome: Progressing   Problem: Education: Goal: Knowledge of General Education information will improve Description: Including pain rating scale, medication(s)/side effects and non-pharmacologic comfort measures Outcome: Progressing   Problem: Health Behavior/Discharge Planning: Goal: Ability to manage health-related needs will improve Outcome: Progressing   Problem: Clinical Measurements: Goal: Ability to maintain clinical measurements within normal limits will improve Outcome: Progressing Goal: Will remain free from infection Outcome: Progressing Goal: Diagnostic  test results will improve Outcome: Progressing Goal: Respiratory complications will improve Outcome: Progressing Goal: Cardiovascular complication will be avoided Outcome: Progressing   Problem: Activity: Goal: Risk for activity intolerance will decrease Outcome: Progressing   Problem: Nutrition: Goal: Adequate nutrition will be maintained Outcome: Progressing   Problem: Coping: Goal: Level of anxiety will decrease Outcome: Progressing   Problem: Elimination: Goal: Will not experience complications related to bowel motility Outcome: Progressing Goal: Will not experience complications related to urinary retention Outcome: Progressing   Problem: Pain Managment: Goal: General experience of comfort will improve and/or be controlled Outcome: Progressing   Problem: Safety: Goal: Ability to remain free from injury will improve Outcome: Progressing   Problem: Skin Integrity: Goal: Risk for impaired skin integrity will decrease Outcome: Progressing

## 2023-06-16 NOTE — Progress Notes (Signed)
 VS and Hgb stable, no additional bleeding noted since heparin  drip discontinued. Wound vac was marked this morning and has no additional blood in canister than was present when nurse stopped heparin  drip and wound vac. Reinforced dressing this morning as well, this was removed as patient c/o discomfort from pressure from reinforcement.

## 2023-06-16 NOTE — Progress Notes (Signed)
 PHARMACY - ANTICOAGULATION CONSULT NOTE  Pharmacy Consult for heparin  drip Indication: Severe PAD   No Known Allergies  Patient Measurements: Height: 6' (182.9 cm) Weight: 108 kg (238 lb 1.6 oz) IBW/kg (Calculated) : 77.6 HEPARIN  DW (KG): 100.3  Vital Signs: Temp: 98.2 F (36.8 C) (05/03 2110) BP: 108/56 (05/03 2110) Pulse Rate: 63 (05/03 2110)  Labs: Recent Labs    06/13/23 0514 06/13/23 1627 06/14/23 2022 06/15/23 0410 06/16/23 0241  HGB 10.5*  --   --  10.5* 10.2*  HCT 31.1*  --   --  32.1* 30.8*  PLT 326  --   --  349 353  HEPARINUNFRC 0.19*   < > 0.33 0.41 0.49   < > = values in this interval not displayed.    Estimated Creatinine Clearance: 74 mL/min (by C-G formula based on SCr of 1.18 mg/dL).   Medical History: Past Medical History:  Diagnosis Date   Arthritis    CHF (congestive heart failure) (HCC)    Diabetes mellitus without complication (HCC)    Erectile dysfunction    History of kidney stones    Hypertension    Sleep apnea     Medications:  Medications Prior to Admission  Medication Sig Dispense Refill Last Dose/Taking   acetaminophen  (TYLENOL ) 325 MG tablet Take 650 mg by mouth every 6 (six) hours as needed for mild pain, headache, fever or moderate pain.   Taking As Needed   ASPIRIN  81 PO Take by mouth daily.   Past Week   atorvastatin  (LIPITOR) 40 MG tablet TAKE 1 TABLET BY MOUTH EVERY DAY *NEW PRESCRIPTION REQUEST* 90 tablet 1 Past Week   carvedilol  (COREG ) 3.125 MG tablet TAKE ONE (1) TABLET BY MOUTH TWICE DAILY *NEW PRESCRIPTION REQUEST* 180 tablet 1 Past Week   cholecalciferol (VITAMIN D) 25 MCG (1000 UT) tablet Take 1,000 Units by mouth daily.   Past Week   dapagliflozin  propanediol (FARXIGA ) 10 MG TABS tablet TAKE 1 TABLET BY MOUTH EVERY DAY BEFORE BREAKFAST *NEW PRESCRIPTION REQUEST* 90 tablet 1 06/07/2023   Dulaglutide 1.5 MG/0.5ML SOPN Inject 1.5 mg into the skin once a week. Mondays   Taking   furosemide  (LASIX ) 40 MG tablet TAKE 1  TABLET BY MOUTH EVERY DAY *NEW PRESCRIPTION REQUEST* 90 tablet 1 Past Week   glipiZIDE (GLUCOTROL) 10 MG tablet Take 10 mg by mouth daily before breakfast.   06/06/2023   insulin  glargine (LANTUS ) 100 UNIT/ML Solostar Pen Inject 40-80 Units into the skin at bedtime.   06/06/2023   Niacin, Antihyperlipidemic, 500 MG TABS Take 500 mg by mouth daily with breakfast.   Past Week   sacubitril -valsartan  (ENTRESTO ) 97-103 MG Take 1 tablet by mouth 2 (two) times daily. 180 tablet 1 06/07/2023 Morning   spironolactone  (ALDACTONE ) 25 MG tablet TAKE 1 TABLET BY MOUTH EVERY DAY *NEW PRESCRIPTION REQUEST* 90 tablet 1 Past Week   tadalafil (CIALIS) 5 MG tablet Take by mouth daily as needed for erectile dysfunction.   Taking As Needed   vitamin B-12 (CYANOCOBALAMIN) 500 MCG tablet Take 500 mcg by mouth daily.   06/07/2023    Assessment: 73 yo male to start heparin  drip for severe PAD with anticipated angiogram on 5/1 Hx: arthritis, CHF, diabetes mellitus 2, kidney stones, hypertension and sleep apnea  On Lovenox  50 mg q24h for DVT ppx with last dose 4/29@2123  30s   INR 1.3  0502 Heparin  drip to resume 4 hours after surgery, no bolus  Date Time HL Rate/Comment 05/02 2022 0.33 Therapeutic x1 05/03  0410 0.41 Therapeutic x 2 05/04 0241 0.49 Therapeutic x 3  Goal of Therapy:  Heparin  level 0.3-0.7 units/ml Monitor platelets by anticoagulation protocol: Yes   Plan:  Continue heparin  infusion at 2350 units/hr Recheck HL daily w/ AM labs while therapeutic Monitor daily heparin  levels while on heparin  infusion Continue to monitor H&H and platelets  Thank you for involving pharmacy in this patient's care.   Coretta Dexter, PharmD, [facility] 06/16/2023 3:51 AM

## 2023-06-17 ENCOUNTER — Inpatient Hospital Stay: Admitting: Certified Registered"

## 2023-06-17 ENCOUNTER — Other Ambulatory Visit: Payer: Self-pay

## 2023-06-17 ENCOUNTER — Encounter
Admission: EM | Disposition: A | Discharge status: Home Health | Payer: Self-pay | Source: Home / Self Care | Attending: Internal Medicine

## 2023-06-17 ENCOUNTER — Encounter: Payer: Self-pay | Admitting: Hospitalist

## 2023-06-17 ENCOUNTER — Inpatient Hospital Stay

## 2023-06-17 DIAGNOSIS — E1152 Type 2 diabetes mellitus with diabetic peripheral angiopathy with gangrene: Secondary | ICD-10-CM | POA: Diagnosis not present

## 2023-06-17 DIAGNOSIS — I96 Gangrene, not elsewhere classified: Secondary | ICD-10-CM | POA: Diagnosis not present

## 2023-06-17 DIAGNOSIS — L03115 Cellulitis of right lower limb: Secondary | ICD-10-CM | POA: Diagnosis not present

## 2023-06-17 DIAGNOSIS — E1140 Type 2 diabetes mellitus with diabetic neuropathy, unspecified: Secondary | ICD-10-CM

## 2023-06-17 HISTORY — PX: IRRIGATION AND DEBRIDEMENT FOOT: SHX6602

## 2023-06-17 HISTORY — PX: GRAFT APPLICATION: SHX6696

## 2023-06-17 LAB — GLUCOSE, CAPILLARY
Glucose-Capillary: 121 mg/dL — ABNORMAL HIGH (ref 70–99)
Glucose-Capillary: 108 mg/dL — ABNORMAL HIGH (ref 70–99)
Glucose-Capillary: 240 mg/dL — ABNORMAL HIGH (ref 70–99)
Glucose-Capillary: 146 mg/dL — ABNORMAL HIGH (ref 70–99)
Glucose-Capillary: 173 mg/dL — ABNORMAL HIGH (ref 70–99)
Glucose-Capillary: 160 mg/dL — ABNORMAL HIGH (ref 70–99)

## 2023-06-17 LAB — CBC
HCT: 33.9 % — ABNORMAL LOW (ref 39.0–52.0)
Hemoglobin: 11.1 g/dL — ABNORMAL LOW (ref 13.0–17.0)
MCH: 30.2 pg (ref 26.0–34.0)
MCHC: 32.7 g/dL (ref 30.0–36.0)
MCV: 92.4 fL (ref 80.0–100.0)
Platelets: 418 10*3/uL — ABNORMAL HIGH (ref 150–400)
RBC: 3.67 MIL/uL — ABNORMAL LOW (ref 4.22–5.81)
RDW: 13.7 % (ref 11.5–15.5)
WBC: 10.3 10*3/uL (ref 4.0–10.5)
nRBC: 0 % (ref 0.0–0.2)

## 2023-06-17 LAB — SURGICAL PATHOLOGY

## 2023-06-17 LAB — HEPARIN LEVEL (UNFRACTIONATED): Heparin Unfractionated: <0.1 [IU]/mL — ABNORMAL LOW (ref 0.30–0.70)

## 2023-06-17 SURGERY — IRRIGATION AND DEBRIDEMENT FOOT
Anesthesia: Choice | Laterality: Right

## 2023-06-17 MED ORDER — LIDOCAINE HCL (PF) 1 % IJ SOLN
INTRAMUSCULAR | Status: AC
  Filled 2023-06-17: qty 30

## 2023-06-17 MED ORDER — BUPIVACAINE HCL (PF) 0.5 % IJ SOLN
INTRAMUSCULAR | Status: AC
  Filled 2023-06-17: qty 30

## 2023-06-17 MED ORDER — FENTANYL CITRATE (PF) 100 MCG/2ML IJ SOLN
INTRAMUSCULAR | Status: AC
  Filled 2023-06-17: qty 2

## 2023-06-17 MED ORDER — ONDANSETRON HCL 4 MG/2ML IJ SOLN: 4.0000 mg | Freq: Once | INTRAMUSCULAR | Status: DC | PRN

## 2023-06-17 MED ORDER — ACETAMINOPHEN 10 MG/ML IV SOLN: 1000.0000 mg | Freq: Once | INTRAVENOUS | Status: DC | PRN

## 2023-06-17 MED ORDER — MAGNESIUM HYDROXIDE 400 MG/5ML PO SUSP
30.0000 mL | Freq: Every day | ORAL | Status: DC | PRN
  Administered 2023-06-18 – 2023-06-19 (×2): 30 mL via ORAL
  Filled 2023-06-17 (×2): qty 30

## 2023-06-17 MED ORDER — PHENYLEPHRINE 80 MCG/ML (10ML) SYRINGE FOR IV PUSH (FOR BLOOD PRESSURE SUPPORT)
PREFILLED_SYRINGE | INTRAVENOUS | Status: DC | PRN
Start: 2023-06-17 — End: 2023-06-17
  Administered 2023-06-17: 160 ug via INTRAVENOUS
  Administered 2023-06-17 (×2): 80 ug via INTRAVENOUS

## 2023-06-17 MED ORDER — VANCOMYCIN HCL 1000 MG IV SOLR
INTRAVENOUS | Status: AC
  Filled 2023-06-17: qty 20

## 2023-06-17 MED ORDER — OXYCODONE HCL 5 MG PO TABS: 5.0000 mg | ORAL_TABLET | Freq: Once | ORAL | Status: DC | PRN

## 2023-06-17 MED ORDER — MIDAZOLAM HCL 2 MG/2ML IJ SOLN
INTRAMUSCULAR | Status: AC
  Filled 2023-06-17: qty 2

## 2023-06-17 MED ORDER — FENTANYL CITRATE (PF) 100 MCG/2ML IJ SOLN: 25.0000 ug | INTRAMUSCULAR | Status: DC | PRN

## 2023-06-17 MED ORDER — BUPIVACAINE HCL 0.5 % IJ SOLN
INTRAMUSCULAR | Status: DC | PRN
  Administered 2023-06-17: 10 mL

## 2023-06-17 MED ORDER — SENNOSIDES-DOCUSATE SODIUM 8.6-50 MG PO TABS
1.0000 | ORAL_TABLET | Freq: Two times a day (BID) | ORAL | Status: DC
  Administered 2023-06-17 – 2023-06-19 (×5): 1 via ORAL
  Filled 2023-06-17 (×5): qty 1

## 2023-06-17 MED ORDER — ONDANSETRON HCL 4 MG/2ML IJ SOLN
INTRAMUSCULAR | Status: DC | PRN
  Administered 2023-06-17: 4 mg via INTRAVENOUS

## 2023-06-17 MED ORDER — GLYCOPYRROLATE 0.2 MG/ML IJ SOLN
INTRAMUSCULAR | Status: DC | PRN
  Administered 2023-06-17: .2 mg via INTRAVENOUS

## 2023-06-17 MED ORDER — PROPOFOL 1000 MG/100ML IV EMUL
INTRAVENOUS | Status: AC
  Filled 2023-06-17: qty 100

## 2023-06-17 MED ORDER — 0.9 % SODIUM CHLORIDE (POUR BTL) OPTIME
TOPICAL | Status: DC | PRN
  Administered 2023-06-17: 1000 mL

## 2023-06-17 MED ORDER — EPHEDRINE SULFATE-NACL 50-0.9 MG/10ML-% IV SOSY
PREFILLED_SYRINGE | INTRAVENOUS | Status: DC | PRN
  Administered 2023-06-17: 10 mg via INTRAVENOUS

## 2023-06-17 MED ORDER — FENTANYL CITRATE (PF) 100 MCG/2ML IJ SOLN
INTRAMUSCULAR | Status: DC | PRN
Start: 2023-06-17 — End: 2023-06-17
  Administered 2023-06-17 (×2): 50 ug via INTRAVENOUS

## 2023-06-17 MED ORDER — METRONIDAZOLE 500 MG PO TABS
500.0000 mg | ORAL_TABLET | Freq: Two times a day (BID) | ORAL | Status: DC
  Administered 2023-06-17 – 2023-06-19 (×4): 500 mg via ORAL
  Filled 2023-06-17 (×4): qty 1

## 2023-06-17 MED ORDER — SODIUM CHLORIDE 0.9 % IV SOLN: INTRAVENOUS | Status: DC | PRN

## 2023-06-17 MED ORDER — PROPOFOL 500 MG/50ML IV EMUL
INTRAVENOUS | Status: DC | PRN
  Administered 2023-06-17: 100 ug/kg/min via INTRAVENOUS

## 2023-06-17 MED ORDER — CEFAZOLIN SODIUM-DEXTROSE 2-4 GM/100ML-% IV SOLN
2.0000 g | Freq: Three times a day (TID) | INTRAVENOUS | Status: DC
  Administered 2023-06-17 – 2023-06-19 (×6): 2 g via INTRAVENOUS
  Filled 2023-06-17 (×7): qty 100

## 2023-06-17 MED ORDER — OXYCODONE HCL 5 MG/5ML PO SOLN: 5.0000 mg | Freq: Once | ORAL | Status: DC | PRN

## 2023-06-17 SURGICAL SUPPLY — 52 items
BLADE MED AGGRESSIVE (BLADE) IMPLANT
BNDG COHESIVE 4X5 TAN STRL LF (GAUZE/BANDAGES/DRESSINGS) ×2 IMPLANT
BNDG ELASTIC 4INX 5YD STR LF (GAUZE/BANDAGES/DRESSINGS) ×2 IMPLANT
BNDG ELASTIC 4X5.8 VLCR NS LF (GAUZE/BANDAGES/DRESSINGS) IMPLANT
BNDG ESMARCH 4X12 STRL LF (GAUZE/BANDAGES/DRESSINGS) ×2 IMPLANT
BNDG GAUZE DERMACEA FLUFF 4 (GAUZE/BANDAGES/DRESSINGS) ×2 IMPLANT
BNDG STRETCH GAUZE 3IN X12FT (GAUZE/BANDAGES/DRESSINGS) ×2 IMPLANT
CNTNR URN SCR LID CUP LEK RST (MISCELLANEOUS) IMPLANT
CUFF TOURN SGL QUICK 18X4 (TOURNIQUET CUFF) IMPLANT
CUFF TRNQT CYL 24X4X16.5-23 (TOURNIQUET CUFF) IMPLANT
DRSG EMULSION OIL 3X8 NADH (GAUZE/BANDAGES/DRESSINGS) ×2 IMPLANT
DURAPREP 26ML APPLICATOR (WOUND CARE) ×2 IMPLANT
ELECTRODE REM PT RTRN 9FT ADLT (ELECTROSURGICAL) ×2 IMPLANT
GAUZE PACKING 0.25INX5YD STRL (GAUZE/BANDAGES/DRESSINGS) IMPLANT
GAUZE SPONGE 4X4 12PLY STRL (GAUZE/BANDAGES/DRESSINGS) ×2 IMPLANT
GAUZE STRETCH 2X75IN STRL (MISCELLANEOUS) ×2 IMPLANT
GAUZE XEROFORM 1X8 LF (GAUZE/BANDAGES/DRESSINGS) IMPLANT
GLOVE BIOGEL PI IND STRL 7.5 (GLOVE) ×2 IMPLANT
GLOVE SURG SYN 7.5 E (GLOVE) ×2 IMPLANT
GLOVE SURG SYN 7.5 PF PI (GLOVE) ×2 IMPLANT
GOWN STRL REUS W/ TWL XL LVL3 (GOWN DISPOSABLE) ×2 IMPLANT
GOWN STRL REUS W/TWL MED LVL3 (GOWN DISPOSABLE) ×2 IMPLANT
GRAFT SKIN WND MICRO 38 (Tissue) IMPLANT
HANDPIECE VERSAJET DEBRIDEMENT (MISCELLANEOUS) IMPLANT
IV NS 1000ML BAXH (IV SOLUTION) ×2 IMPLANT
KIT TURNOVER KIT A (KITS) ×2 IMPLANT
LABEL OR SOLS (LABEL) ×2 IMPLANT
MANIFOLD NEPTUNE II (INSTRUMENTS) ×2 IMPLANT
NDL BIOPSY JAMSHIDI 11X6 (NEEDLE) IMPLANT
NDL FILTER BLUNT 18X1 1/2 (NEEDLE) ×2 IMPLANT
NDL HYPO 25X1 1.5 SAFETY (NEEDLE) ×2 IMPLANT
NEEDLE BIOPSY JAMSHIDI 11X6 (NEEDLE) IMPLANT
NEEDLE FILTER BLUNT 18X1 1/2 (NEEDLE) IMPLANT
NEEDLE HYPO 25X1 1.5 SAFETY (NEEDLE) IMPLANT
NS IRRIG 500ML POUR BTL (IV SOLUTION) ×2 IMPLANT
PACK EXTREMITY ARMC (MISCELLANEOUS) ×2 IMPLANT
PACKING GAUZE IODOFORM 1INX5YD (GAUZE/BANDAGES/DRESSINGS) IMPLANT
PAD ABD DERMACEA PRESS 5X9 (GAUZE/BANDAGES/DRESSINGS) ×2 IMPLANT
PAD PREP OB/GYN DISP 24X41 (PERSONAL CARE ITEMS) ×2 IMPLANT
PENCIL SMOKE EVACUATOR (MISCELLANEOUS) ×2 IMPLANT
SOL .9 NS 3000ML IRR UROMATIC (IV SOLUTION) IMPLANT
SOLUTION PREP PVP 2OZ (MISCELLANEOUS) ×2 IMPLANT
STAPLER SKIN PROX 35W (STAPLE) ×2 IMPLANT
STOCKINETTE IMPERVIOUS 9X36 MD (GAUZE/BANDAGES/DRESSINGS) ×2 IMPLANT
SUT PROLENE 2 0 FS (SUTURE) IMPLANT
SUT PROLENE 3 0 PS 2 (SUTURE) IMPLANT
SUTURE ETHLN 4-0 FS2 18XMF BLK (SUTURE) IMPLANT
SWAB CULTURE AMIES ANAERIB BLU (MISCELLANEOUS) IMPLANT
SYR 10ML LL (SYRINGE) ×4 IMPLANT
TIP FAN IRRIG PULSAVAC PLUS (DISPOSABLE) IMPLANT
TRAP FLUID SMOKE EVACUATOR (MISCELLANEOUS) ×2 IMPLANT
WATER STERILE IRR 500ML POUR (IV SOLUTION) ×2 IMPLANT

## 2023-06-17 NOTE — Op Note (Signed)
 Full Operative Report  Date of Operation: 8:08 AM, 06/17/2023   Patient: Scott Hale - 73 y.o. male  Surgeon: Evertt Hoe, DPM   Assistant: None  Diagnosis: RIGHT FOOT gangrene; status post prior transmetatarsal amputation  Procedure:  1.  Repeat irrigation and debridement of transmetatarsal amputation site with prep for graft application, right foot 2.  Graft application 38 cm, right foot 3.  Delayed primary closure 9 x 6 x 4 cm, right foot    Anesthesia: Anesthesia type not filed in the log.  Lattie Poli, MD  Anesthesiologist: Lattie Poli, MD CRNA: Curvin Downing, CRNA   Estimated Blood Loss: 20 mL  Hemostasis: 1) Anatomical dissection, mechanical compression, electrocautery 2) no tourniquet was used during procedure  Implants: Implant Name Type Inv. Item Serial No. Manufacturer Lot No. LRB No. Used Action  GRAFT SKIN WND MICRO 38 - [id number] Tissue GRAFT SKIN WND MICRO 38  [employer] [id number] Right 1 Implanted    Materials: Prolene 2-0 and skin staples  Injectables: 1) Pre-operatively: None 2) Post-operatively: 10 cc 0.5% Marcaine  plain  Specimens: Pathology: None microbiology: None   Antibiotics: IV antibiotics given per schedule on the floor  Drains: None  Complications: Patient tolerated the procedure well without complication.   Operative findings: As below in detailed report  Indications for Procedure: Kashton Dehlinger presents to Evertt Hoe, DPM with a chief complaint of open right foot transmetatarsal amputation site status post prior amputation 3 days ago.  Presenting for planned return to the operating room for repeat debridement washout grafting and closure.  The patient has failed conservative treatments of various modalities. At this time the patient has elected to proceed with surgical correction. All alternatives, risks, and complications of the procedures were thoroughly explained to the patient. Patient exhibits  appropriate understanding of all discussion points and informed consent was signed and obtained in the chart with no guarantees to surgical outcome given or implied.  Description of Procedure: Patient was brought to the operating room. Patient remained on their hospital bed in the supine position. A surgical timeout was performed and all members of the operating room, the procedure, and the surgical site were identified. anesthesia occurred as per anesthesia record. Local anesthetic as previously described was then injected about the operative field in a local infiltrative block.  The operative lower extremity as noted above was then prepped and draped in the usual sterile manner. The following procedure then began.  Attention was directed to the transmetatarsal amputation site of the right foot.  There was some residual necrotic tissues both in the plantar flap as well as dorsally near the area of tracking soft tissue infection seen previously.  The plantar soft tissue flap was then debrided and the margin of the flap was excised with 15 blade.  Rongeur was used to remove any necrotic or fibrotic tissues from the wound bed.  Resection was carried back of the soft tissues to healthy bleeding tissue.  Dorsally the same was performed with resection of some necrotic tissues at the central aspect of the dorsal flap.  Then using a sagittal saw the metatarsals were resected slightly proximally approximately 1 to 2 cm from prior amputation site.  There is no evidence of osseous infection or tracking infection.  Further debridement was performed with rongeur and the tissue margins were excised to healthy bleeding level with 15 blade.  The surgical site was then irrigated thoroughly with 1 L of sterile saline via pulse lavage.  Electro cautery was  used for hemostasis as needed.  Next in order to promote wound healing as well as fill dead space and for tissue stability Kerecis micronized dermal allograft 30 cm was  then implanted within the amputation site.  Then delayed primary closure was performed.  The surgical site measured approximately 9 x 6 cm x 4 cm postdebridement.  The plantar flap was then brought into approximation with the dorsal flap under minimal tension and closed with 2-0 -prolene and skin staples.   The surgical site was then dressed with Xeroform 4 x 4 ABD pad Kerlix Ace. The patient tolerated both the procedure and anesthesia well with vital signs stable throughout. The patient was transferred in good condition and all vital signs stable  from the OR to recovery under the discretion of anesthesia.  Condition: Vital signs stable, neurovascular status unchanged from preoperative   Surgical plan:  No further soft tissue or osseous infection thought present at this time.  Amputation site closed and delayed primary fashion today under minimal tension.  Good prognosis for healing adequate bleeding in the soft tissue. plan for  10 days p.o. antibiotics on discharge, follow culture to determine appropriate agent.  Nonweightbearing right foot in postop shoe.   The patient will be nonweightbearing in a postop shoe to the operative limb until further instructed. The dressing is to remain clean, dry, and intact. Will continue to follow unless noted elsewhere.   Russ Course, DPM [facility]

## 2023-06-17 NOTE — Transfer of Care (Signed)
 Immediate Anesthesia Transfer of Care Note  Patient: Scott Hale  Procedure(s) Performed: IRRIGATION AND DEBRIDEMENT FOOT (Right)  Patient Location: PACU  Anesthesia Type:General  Level of Consciousness: drowsy  Airway & Oxygen Therapy: Patient Spontanous Breathing and Patient connected to face mask oxygen  Post-op Assessment: Report given to RN  Post vital signs: stable  Last Vitals:  Vitals Value Taken Time  BP 101/79 06/17/23 0807  Temp    Pulse 68 06/17/23 0809  Resp 15 06/17/23 0809  SpO2 100 % 06/17/23 0809  Vitals shown include unfiled device data.  Last Pain:  Vitals:   06/17/23 0650  TempSrc:   PainSc: 0-No pain      Patients Stated Pain Goal: 0 (06/14/23 2118)  Complications: No notable events documented.

## 2023-06-17 NOTE — Progress Notes (Signed)
 History and Physical Interval Note:  06/17/2023 7:18 AM  Scott Hale  has presented today for surgery, with the diagnosis of open surgical site right foot s/p R foot TMA, planned staged procedure.  The various methods of treatment have been discussed with the patient and family. After consideration of risks, benefits and other options for treatment, the patient has consented to   Procedure(s): IRRIGATION AND DEBRIDEMENT FOOT (Right) Repeat washout right foot with delayed primary closure and graft application as a surgical intervention.  The patient's history has been reviewed, patient examined, no change in status, stable for surgery.  I have reviewed the patient's chart and labs.  Questions were answered to the patient's satisfaction.     Karlene Overcast Owen Pagnotta

## 2023-06-17 NOTE — Progress Notes (Signed)
 Date of Admission:  06/07/2023     ID: Scott Hale is a 73 y.o. 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 The surgical note says there was significant purulence, infection and a wound VAC was placed Pt underwent repeat I/D and graft application 06/17/23 Patient is doing okay says he is feeling better  Medications:   atorvastatin   40 mg Oral Daily   carvedilol   3.125 mg Oral BID WC   furosemide   40 mg Oral Daily   insulin  aspart  0-9 Units Subcutaneous TID WC   insulin  glargine-yfgn  45 Units Subcutaneous QHS   sacubitril -valsartan   1 tablet Oral BID   senna-docusate  1 tablet Oral BID   spironolactone   25 mg Oral Daily    Objective: Vital signs in last 24 hours: Patient Vitals for the past 24 hrs:  BP Temp Temp src Pulse Resp SpO2 Height Weight  06/17/23 1152 105/67 98.4 F (36.9 C) Oral 70 18 99 % -- --  06/17/23 0845 98/63 97.6 F (36.4 C) -- 73 15 94 % -- --  06/17/23 0839 (!) 89/57 -- -- 75 -- 98 % -- --  06/17/23 0830 103/64 -- -- 79 12 100 % -- --  06/17/23 0825 (!) 103/54 -- -- 74 12 100 % -- --  06/17/23 0815 98/62 -- -- 69 15 100 % -- --  06/17/23 0812 (!) 104/58 -- -- 67 15 100 % -- --  06/17/23 0807 101/79 -- -- 64 13 99 % -- --  06/17/23 0806 (!) 88/49 (!) 97.5 F (36.4 C) -- 63 14 99 % -- --  06/17/23 0650 126/73 98.1 F (36.7 C) -- 66 14 96 % 6' (1.829 m) 108 kg  06/17/23 0436 121/66 -- -- 64 18 99 % -- --  06/16/23 1956 107/67 98 F (36.7 C) Oral 63 18 99 % -- --  06/16/23 1507 102/73 97.6 F (36.4 C) -- 62 16 100 % -- --  06/16/23 1243 138/78 -- -- (!) 57 -- -- -- --       PHYSICAL EXAM:  General: Alert, cooperative, no distress, appears stated age.  Lungs: Clear to auscultation bilaterally. No  Wheezing or Rhonchi. No rales. Heart: Regular rate and rhythm, no murmur, rub or gallop. Abdomen: Soft, non-tender,not distended. Bowel sounds normal. No masses Extremities: S/p TMA- surgical dresisng not removed Skin: No rashes or lesions. Or bruising Lymph: Cervical, supraclavicular normal. Neurologic: Grossly non-focal  Lab Results    Latest Ref Rng & Units 06/17/2023    3:23 AM 06/16/2023    8:35 PM 06/16/2023   12:16 PM  CBC  WBC 4.0 - 10.5 K/uL 10.3     Hemoglobin 13.0 - 17.0 g/dL 16.1  09.6  04.5   Hematocrit 39.0 - 52.0 % 33.9  32.4  32.1   Platelets 150 - 400 K/uL 418          Latest Ref Rng & Units 06/14/2023    9:38 PM 06/12/2023    5:08 AM 06/11/2023    3:15 AM  CMP  Glucose 70 - 99 mg/dL 409  811  62   BUN 8 - 23 mg/dL  11  10   Creatinine 9.14 - 1.24 mg/dL  7.82  1.27   Sodium 135 - 145 mmol/L  134  131   Potassium 3.5 - 5.1 mmol/L  4.2  4.2   Chloride 98 - 111 mmol/L  99  96   CO2 22 - 32 mmol/L  25  25   Calcium  8.9 - 10.3 mg/dL  8.8  8.8           Assessment/Plan: 73 yr male with DM presents with pain rt foot of 2 weeks duration   Rt foot infection- diabetic/PAD/ neuropathy Rt foot swelling and erythema and tenderness with interdigital cleft maceration between 3/4 th toe with discharge 4th toe discolored  soft tissue infection of the rt foot now with gangrene of 3/4 toes - MRI showed non viable Murray tissue extending from the interdital cleft 3/4 along the dorsum On  zosyn  and linezolid  S/p TMA- has wound vac Culture MSSA So DC the current antibiotics and start cefazolin  and flagyl    Anemia  PAD- underwent angio and had angioplasty and stent placement of rt ATA, SFA, popliteal  DM- no recent A1c   Htn   CAD s/p CABG   RT TKA) no evidence of PJI ? Discussed the management with the patient  at bedside

## 2023-06-17 NOTE — Evaluation (Signed)
 Physical Therapy Evaluation Patient Details Name: Scott Hale MRN: [id number] DOB: 12/08/1950 Today's Date: 06/17/2023  History of Present Illness  Scott Hale is a 73 y.o. male with medical history significant of DM2, HTN, CKD who presented with right foot pain. Underwent transmetatarsal amputation of right foot and wound VAC application on 06/14/23. S/p I&D of wound on 06/17/23.  Clinical Impression  Pt in bed on entry, still a little groggy from OR, agreeable to session, no pain on arrival. Pt able to demonstrate successful transfers with weightbearing status adherence, but these require considerable effort. Pt hopping [place] is met with exhaustion requiring lengthy seated break between 2 consecutive efforts, but distance limited to ~36ft. This will make frequent mobility needs in the home quite dodgy, hence we discuss supportive DME measures in place such as wheelchair. Will continue to follow.       If plan is discharge home, recommend the following: Assist for transportation;Assistance with cooking/housework;A little help with walking and/or transfers;A little help with bathing/dressing/bathroom;Help with stairs or ramp for entrance   Can travel by private vehicle        Equipment Recommendations Rolling walker (2 wheels);BSC/3in1;Wheelchair (measurements PT)  Recommendations for Other Services       Functional Status Assessment Patient has had a recent decline in their functional status and demonstrates the ability to make significant improvements in function in a reasonable and predictable amount of time.     Precautions / Restrictions Precautions Precautions: Fall Recall of Precautions/Restrictions: Intact Restrictions RLE Weight Bearing Per Provider Order: Non weight bearing Other Position/Activity Restrictions: RLE NWB with post op shoe donned      Mobility  Bed Mobility Overal bed mobility: Modified Independent Bed Mobility: Supine to Sit, Sit to Supine     Supine  to sit: Modified independent (Device/Increase time), HOB elevated, Used rails Sit to supine: Modified independent (Device/Increase time), HOB elevated, Used rails   General bed mobility comments: a little winded today, VSS    Transfers Overall transfer level: Needs assistance Equipment used: Rolling walker (2 wheels) Transfers: Sit to/from Stand Sit to Stand: Supervision, From elevated surface           General transfer comment: concerted effort, but able to achieve task without cues    Ambulation/Gait Ambulation/Gait assistance: Contact guard assist Gait Distance (Feet): 16 Feet Assistive device: Rolling walker (2 wheels) Gait Pattern/deviations: Step-to pattern Gait velocity: increased     General Gait Details: Hop-to pattern to BR door and back, performed twice, but no farther due to global fatigue  Stairs            Wheelchair Mobility     Tilt Bed    Modified Rankin (Stroke Patients Only)       Balance                                             Pertinent Vitals/Pain Pain Assessment Pain Assessment: No/denies pain    Home Living Family/patient expects to be discharged to:: Private residence Living Arrangements: Non-relatives/Friends Available Help at Discharge: Friend(s);Available 24 hours/day Type of Home: House Home Access: Stairs to enter Entrance Stairs-Rails: None Entrance Stairs-Number of Steps: 1   Home Layout: Able to live on main level with bedroom/bathroom Home Equipment: None      Prior Function Prior Level of Function : Driving  Mobility Comments: Ind amb community distances without AD, no fall history, was going to gym weekly       Extremity/Trunk Assessment   Upper Extremity Assessment Upper Extremity Assessment: Overall WFL for tasks assessed    Lower Extremity Assessment Lower Extremity Assessment: Overall WFL for tasks assessed       Communication    Communication Communication: No apparent difficulties    Cognition                                         Cueing       General Comments      Exercises Other Exercises Other Exercises: 73ft AMB twice, seat break between, RW height adjustment tomaximize BUE ergonomics. Other Exercises: Scott Hale assists with footwear management presession   Assessment/Plan    PT Assessment Patient needs continued PT services  PT Problem List Decreased strength;Decreased activity tolerance;Decreased balance;Decreased mobility;Decreased knowledge of use of DME;Decreased knowledge of precautions       PT Treatment Interventions DME instruction;Gait training;Stair training;Functional mobility training;Therapeutic activities;Therapeutic exercise;Balance training;Patient/family education    PT Goals (Current goals can be found in the Care Plan section)  Acute Rehab PT Goals Patient Stated Goal: To get back to working out at the gym PT Goal Formulation: With patient Time For Goal Achievement: 06/28/23 Potential to Achieve Goals: Good    Frequency 7X/week     Co-evaluation               AM-PAC PT "6 Clicks" Mobility  Outcome Measure Help needed turning from your back to your side while in a flat bed without using bedrails?: A Little Help needed moving from lying on your back to sitting on the side of a flat bed without using bedrails?: A Little Help needed moving to and from a bed to a chair (including a wheelchair)?: A Little Help needed standing up from a chair using your arms (e.g., wheelchair or bedside chair)?: A Little Help needed to walk in hospital room?: A Little Help needed climbing 3-5 steps with a railing? : A Lot 6 Click Score: 17    End of Session Equipment Utilized During Treatment: Gait belt Activity Tolerance: Patient tolerated treatment well Patient left: in chair;with call bell/phone within reach;with family/visitor present;with chair alarm  set Nurse Communication: Mobility status;Weight bearing status PT Visit Diagnosis: Other abnormalities of gait and mobility (R26.89);Muscle weakness (generalized) (M62.81)    Time: 1610-9604 PT Time Calculation (min) (ACUTE ONLY): 27 min   Charges:   PT Evaluation $PT Re-evaluation: 1 Re-eval   PT General Charges $$ ACUTE PT VISIT: 1 Visit       5:01 PM, 06/17/23 Dawn Eth, PT, DPT Physical Therapist - [facility]  [phone] (ASCOM)    Scott Hale 06/17/2023, 4:58 PM

## 2023-06-17 NOTE — Brief Op Note (Signed)
 06/17/2023  8:07 AM  PATIENT:  Georgetta Kinnier  73 y.o. male  PRE-OPERATIVE DIAGNOSIS:  RIGHT FOOT SEPTIC ARTHRITIS  POST-OPERATIVE DIAGNOSIS:  RIGHT FOOT SEPTIC ARTHRITIS  PROCEDURE:  Procedure(s): IRRIGATION AND DEBRIDEMENT FOOT (Right)  SURGEON:  Surgeons and Role:    * Chanah Tidmore, Karlene Overcast, DPM - Primary  PHYSICIAN ASSISTANT:   ASSISTANTS: none   ANESTHESIA:   local and IV sedation  EBL:  20 mL  BLOOD ADMINISTERED:none  DRAINS: none   LOCAL MEDICATIONS USED:  MARCAINE      SPECIMEN:  No Specimen  DISPOSITION OF SPECIMEN:  N/A  COUNTS:  YES  TOURNIQUET:  None  DICTATION: .Note written in EPIC  PLAN OF CARE: Admit to inpatient   PATIENT DISPOSITION:  PACU - hemodynamically stable.   Delay start of Pharmacological VTE agent (>24hrs) due to surgical blood loss or risk of bleeding: no

## 2023-06-17 NOTE — Progress Notes (Signed)
 PROGRESS NOTE    [name]  ZOX:[id number] DOB: 05/08/1950 DOA: 06/07/2023 PCP: Lyle San, MD    Brief Narrative:   Scott Hale is a 73 y.o. male with medical history significant of DM2, HTN, CKD who presented with right foot pain.   Underwent transmetatarsal amputation of right foot and wound VAC application on 5/2  5/4: Bleeding noted from surgical site.  Hemoglobin relatively stable.  Vital signs remained stable.  Patient feels okay  5/5: S/p OR for wound washout and closure.  Discussed with podiatry.   Assessment & Plan:   Principal Problem:   Cellulitis Active Problems:   Gangrene of toe of right foot (HCC)   PAD (peripheral artery disease) (HCC)   Cellulitis of right foot   Diabetic foot infection (HCC)   Gangrene of right foot (HCC)  RLE cellulitis  Possible osteomyelitis and septic arthritis Postoperative bleeding --received vanc/cefe/flagyl  in the ED, then switched to Ancef .  No open wounds on presentation, but wounds opened up in between toes on 4/28.  ID and podiatry consulted.  Abx switched to abx to Unasyn  and Zyvox , per ID. --MRI right foot showed multiple areas of osteomyelitis -- Bleeding noted from surgical site 5/4 AM.  Heparin  GTT stopped --Status post return to the OR 5/5 for wound washout and closure Plan: Continue IV antibiotics per ID recommendation, currently Zosyn  and Zyvox  Local wound care If hemoglobin stable resume antiplatelet agents 5/6  PAD --ABI with Findings indicative of moderate right and mild left lower extremity peripheral arterial disease.  --Vascular surgery consulted --S/p angio with stent placement x 2 Plan: Stop heparin  GTT Resume antiplatelet agents 5/6   Severe Sepsis --fever, leukocytosis, with AKI, source cellulitis --On IV antibiotics as above   DM2 --recent A1c 7.6.  Pt reported taking Levemir 80u nightly at home. --Continue glargine to 45u nightly --ACHS and SSI   HTN -- Continue coreg    AKI on CKD  3a --Cr 1.85 on presentation, improved to 1.37 with IVF. --oral hydration now --No indication for IVF   Hyponatremia --of unclear significance   DVT prophylaxis: On hold.  Subcu Lovenox  to start 5/6 Code Status: Full Family Communication: Friend at bedside 4/30, 5/1, 5/5 Disposition Plan: Status is: Inpatient Remains inpatient appropriate because: Lower extremity osteomyelitis   Level of care: Med-Surg  Consultants:  ID Podiatry Vascular surgery  Procedures:  Lower extremity angiogram 5/1 Right TMA 5/2  Antimicrobials: Unasyn  Zyvox    Subjective: Seen and examined.  No complaints.  Pain well-controlled.  Objective: Vitals:   06/17/23 0825 06/17/23 0830 06/17/23 0839 06/17/23 0845  BP: (!) 103/54 103/64 (!) 89/57 98/63  Pulse: 74 79 75 73  Resp: 12 12  15   Temp:    97.6 F (36.4 C)  TempSrc:      SpO2: 100% 100% 98% 94%  Weight:      Height:        Intake/Output Summary (Last 24 hours) at 06/17/2023 1019 Last data filed at 06/17/2023 0804 Gross per 24 hour  Intake 1796.88 ml  Output 1650 ml  Net 146.88 ml   Filed Weights   06/13/23 0927 06/14/23 0739 06/17/23 0650  Weight: 108 kg 108 kg 108 kg    Examination:  General exam: NAD Respiratory system: Lungs clear.  Normal work of breathing to room air Cardiovascular system: S1-S2, RRR, no murmurs, no pedal edema Gastrointestinal system: Soft, NT/ND, normal bowel Central nervous system: Alert and oriented. No focal neurological deficits. Extremities: Status post right TMA.  Wound dressed.  Dressings clean dry and intact Skin: No rashes, lesions or ulcers Psychiatry: Judgement and insight appear normal. Mood & affect appropriate.     Data Reviewed: I have personally reviewed following labs and imaging studies  CBC: Recent Labs  Lab 06/12/23 0508 06/13/23 0514 06/15/23 0410 06/16/23 0241 06/16/23 1216 06/16/23 2035 06/17/23 0323  WBC 18.8* 17.2* 17.5* 14.0*  --   --  10.3  HGB 11.0* 10.5*  10.5* 10.2* 10.6* 10.6* 11.1*  HCT 32.3* 31.1* 32.1* 30.8* 32.1* 32.4* 33.9*  MCV 89.0 90.4 91.2 91.1  --   --  92.4  PLT 331 326 349 353  --   --  418*   Basic Metabolic Panel: Recent Labs  Lab 06/11/23 0315 06/12/23 0508 06/14/23 2138  NA 131* 134*  --   K 4.2 4.2  --   CL 96* 99  --   CO2 25 25  --   GLUCOSE 62* 100* 442*  BUN 10 11  --   CREATININE 1.27* 1.18  --   CALCIUM  8.8* 8.8*  --    GFR: Estimated Creatinine Clearance: 74 mL/min (by C-G formula based on SCr of 1.18 mg/dL). Liver Function Tests: No results for input(s): "AST", "ALT", "ALKPHOS", "BILITOT", "PROT", "ALBUMIN" in the last 168 hours. No results for input(s): "LIPASE", "AMYLASE" in the last 168 hours. No results for input(s): "AMMONIA" in the last 168 hours. Coagulation Profile: Recent Labs  Lab 06/12/23 1209  INR 1.3*   Cardiac Enzymes: No results for input(s): "CKTOTAL", "CKMB", "CKMBINDEX", "TROPONINI" in the last 168 hours. BNP (last 3 results) No results for input(s): "PROBNP" in the last 8760 hours. HbA1C: No results for input(s): "HGBA1C" in the last 72 hours.  CBG: Recent Labs  Lab 06/16/23 0752 06/16/23 1205 06/16/23 1654 06/17/23 0623 06/17/23 0812  GLUCAP 126* 197* 138* 121* 108*   Lipid Profile: No results for input(s): "CHOL", "HDL", "LDLCALC", "TRIG", "CHOLHDL", "LDLDIRECT" in the last 72 hours. Thyroid Function Tests: No results for input(s): "TSH", "T4TOTAL", "FREET4", "T3FREE", "THYROIDAB" in the last 72 hours. Anemia Panel: No results for input(s): "VITAMINB12", "FOLATE", "FERRITIN", "TIBC", "IRON", "RETICCTPCT" in the last 72 hours. Sepsis Labs: No results for input(s): "PROCALCITON", "LATICACIDVEN" in the last 168 hours.   Recent Results (from the past 240 hours)  Resp panel by RT-PCR (RSV, Flu A&B, Covid) Anterior Nasal Swab     Status: None   Collection Time: 06/07/23  4:53 PM   Specimen: Anterior Nasal Swab  Result Value Ref Range Status   SARS Coronavirus 2  by RT PCR NEGATIVE NEGATIVE Final    Comment: (NOTE) SARS-CoV-2 target nucleic acids are NOT DETECTED.  The SARS-CoV-2 RNA is generally detectable in upper respiratory specimens during the acute phase of infection. The lowest concentration of SARS-CoV-2 viral copies this assay can detect is 138 copies/mL. A negative result does not preclude SARS-Cov-2 infection and should not be used as the sole basis for treatment or other patient management decisions. A negative result may occur with  improper specimen collection/handling, submission of specimen other than nasopharyngeal swab, presence of viral mutation(s) within the areas targeted by this assay, and inadequate number of viral copies(<138 copies/mL). A negative result must be combined with clinical observations, patient history, and epidemiological information. The expected result is Negative.  Fact Sheet for Patients:  BloggerCourse.com  Fact Sheet for Healthcare Providers:  SeriousBroker.it  This test is no t yet approved or cleared by the [place]  FDA and  has been authorized for detection and/or diagnosis  of SARS-CoV-2 by FDA under an Emergency Use Authorization (EUA). This EUA will remain  in effect (meaning this test can be used) for the duration of the COVID-19 declaration under Section 564(b)(1) of the Act, 21 U.S.C.section 360bbb-3(b)(1), unless the authorization is terminated  or revoked sooner.       Influenza A by PCR NEGATIVE NEGATIVE Final   Influenza B by PCR NEGATIVE NEGATIVE Final    Comment: (NOTE) The Xpert Xpress SARS-CoV-2/FLU/RSV plus assay is intended as an aid in the diagnosis of influenza from Nasopharyngeal swab specimens and should not be used as a sole basis for treatment. Nasal washings and aspirates are unacceptable for Xpert Xpress SARS-CoV-2/FLU/RSV testing.  Fact Sheet for Patients: BloggerCourse.com  Fact  Sheet for Healthcare Providers: SeriousBroker.it  This test is not yet approved or cleared by the [place]  FDA and has been authorized for detection and/or diagnosis of SARS-CoV-2 by FDA under an Emergency Use Authorization (EUA). This EUA will remain in effect (meaning this test can be used) for the duration of the COVID-19 declaration under Section 564(b)(1) of the Act, 21 U.S.C. section 360bbb-3(b)(1), unless the authorization is terminated or revoked.     Resp Syncytial Virus by PCR NEGATIVE NEGATIVE Final    Comment: (NOTE) Fact Sheet for Patients: BloggerCourse.com  Fact Sheet for Healthcare Providers: SeriousBroker.it  This test is not yet approved or cleared by the [place]  FDA and has been authorized for detection and/or diagnosis of SARS-CoV-2 by FDA under an Emergency Use Authorization (EUA). This EUA will remain in effect (meaning this test can be used) for the duration of the COVID-19 declaration under Section 564(b)(1) of the Act, 21 U.S.C. section 360bbb-3(b)(1), unless the authorization is terminated or revoked.  Performed at [facility], [address] [place], [place] [address]   Culture, blood (routine x 2)     Status: None   Collection Time: 06/07/23  5:25 PM   Specimen: BLOOD  Result Value Ref Range Status   Specimen Description BLOOD RIGHT ANTECUBITAL  Final   Special Requests   Final    BOTTLES DRAWN AEROBIC AND ANAEROBIC Blood Culture results may not be optimal due to an inadequate volume of blood received in culture bottles   Culture   Final    NO GROWTH 5 DAYS Performed at [facility], [address] [place], [place] [address]    Report Status 06/12/2023 FINAL  Final  Culture, blood (routine x 2)     Status: None   Collection Time: 06/07/23  5:27 PM   Specimen: BLOOD  Result Value Ref Range Status   Specimen Description BLOOD LEFT  ANTECUBITAL  Final   Special Requests   Final    BOTTLES DRAWN AEROBIC AND ANAEROBIC Blood Culture results may not be optimal due to an inadequate volume of blood received in culture bottles   Culture   Final    NO GROWTH 5 DAYS Performed at [facility], [address], [place], [place] [address]    Report Status 06/12/2023 FINAL  Final  Aerobic Culture w Gram Stain (superficial specimen)     Status: None   Collection Time: 06/10/23  7:35 PM   Specimen: Wound  Result Value Ref Range Status   Specimen Description   Final    WOUND Performed at [facility], [address] [place], [place] [address]    Special Requests   Final    RIGHT FOOT Performed at [facility], [address]  [place], [place] [address]    Gram Stain NO WBC SEEN FEW GRAM POSITIVE COCCI IN PAIRS   Final   Culture   Final    RARE GROUP B STREP(S.AGALACTIAE)ISOLATED TESTING AGAINST S. AGALACTIAE NOT ROUTINELY PERFORMED DUE TO PREDICTABILITY OF AMP/PEN/VAN SUSCEPTIBILITY. WITHIN MIXED NORMAL SKIN FLORA Performed at [facility], [address] [place], [place] [address]    Report Status 06/13/2023 FINAL  Final  Aerobic/Anaerobic Culture w Gram Stain (surgical/deep wound)     Status: None (Preliminary result)   Collection Time: 06/14/23  8:45 AM   Specimen: Foot, Right; Amputation  Result Value Ref Range Status   Specimen Description   Final    TISSUE Performed at [facility], [address], [place], [place] [address]    Special Requests   Final    RIGHT FOOT TISSUE CULTURE Performed at [facility], [address] [place], [place] [address]    Gram Stain   Final    FEW WBC PRESENT, PREDOMINANTLY PMN NO ORGANISMS SEEN Performed at [facility], [address] [place], [place] [address]    Culture   Final    RARE STAPHYLOCOCCUS AUREUS SUSCEPTIBILITIES TO FOLLOW NO ANAEROBES ISOLATED; CULTURE IN PROGRESS FOR 5 DAYS    Report Status  PENDING  Incomplete  Aerobic/Anaerobic Culture w Gram Stain (surgical/deep wound)     Status: None (Preliminary result)   Collection Time: 06/14/23  8:52 AM   Specimen: Foot, Right; Blood  Result Value Ref Range Status   Specimen Description   Final    FOOT Performed at [facility], [address] [place], [place] [address]    Special Requests RIGHT FOOT  Final   Gram Stain   Final    NO WBC SEEN NO ORGANISMS SEEN Performed at [facility], [address] [place], [place] [address]    Culture   Final    RARE STAPHYLOCOCCUS AUREUS NO ANAEROBES ISOLATED; CULTURE IN PROGRESS FOR 5 DAYS    Report Status PENDING  Incomplete         Radiology Studies: DG Foot 2 Views Right Result Date: 06/17/2023 CLINICAL DATA:  Status post right foot surgery. EXAM: RIGHT FOOT - 2 VIEW COMPARISON:  Right foot radiographs 06/14/2023 FINDINGS: There is repeat amputation of the first through fifth rays, now to the proximal metatarsal shafts. Interval removal of the prior distal foot wound VAC. New distal postoperative fat surgical skin staples. No subcutaneous air. No definite cortical erosion. Mild dorsal talonavicular and navicular-cuneiform joint space narrowing and peripheral osteophytosis, unchanged. IMPRESSION: Interval revision amputation of the first through fifth rays, now to the proximal metatarsal shafts. No abnormal finding is seen. Electronically Signed   By: Bertina Broccoli M.D.   On: 06/17/2023 09:03         Scheduled Meds:  atorvastatin   40 mg Oral Daily   carvedilol   3.125 mg Oral BID WC   furosemide   40 mg Oral Daily   insulin  aspart  0-9 Units Subcutaneous TID WC   insulin  glargine-yfgn  45 Units Subcutaneous QHS   sacubitril -valsartan   1 tablet Oral BID   spironolactone   25 mg Oral Daily   Continuous Infusions:  linezolid  (ZYVOX ) IV 600 mg (06/16/23 2040)   piperacillin -tazobactam (ZOSYN )  IV 3.375 g (06/17/23 0617)     LOS: 9 days    Tiajuana Fluke,  MD [facility]   If 7PM-7AM, please contact night-coverage  06/17/2023, 10:19 AM

## 2023-06-17 NOTE — Plan of Care (Signed)
   Problem: Education: Goal: Ability to describe self-care measures that may prevent or decrease complications (Diabetes Survival Skills Education) will improve Outcome: Progressing   Problem: Coping: Goal: Ability to adjust to condition or change in health will improve Outcome: Progressing   Problem: Fluid Volume: Goal: Ability to maintain a balanced intake and output will improve Outcome: Progressing

## 2023-06-17 NOTE — Anesthesia Postprocedure Evaluation (Signed)
 Anesthesia Post Note  Patient: Scott Hale  Procedure(s) Performed: IRRIGATION AND DEBRIDEMENT FOOT (Right)  Patient location during evaluation: PACU Anesthesia Type: General Level of consciousness: awake and alert Pain management: pain level controlled Vital Signs Assessment: post-procedure vital signs reviewed and stable Respiratory status: spontaneous breathing, nonlabored ventilation, respiratory function stable and patient connected to nasal cannula oxygen Cardiovascular status: blood pressure returned to baseline and stable Postop Assessment: no apparent nausea or vomiting Anesthetic complications: no   No notable events documented.   Last Vitals:  Vitals:   06/17/23 0830 06/17/23 0839  BP: 103/64 (!) 89/57  Pulse: 79 75  Resp: 12   Temp:    SpO2: 100% 98%    Last Pain:  Vitals:   06/17/23 0839  TempSrc:   PainSc: 0-No pain                 Lattie Poli

## 2023-06-17 NOTE — Evaluation (Signed)
 Occupational Therapy Evaluation Patient Details Name: Scott Hale MRN: [id number] DOB: April 03, 1950 Today's Date: 06/17/2023   History of Present Illness   Scott Hale is a 73 y.o. male with medical history significant of DM2, HTN, CKD who presented with right foot pain. Underwent transmetatarsal amputation of right foot and wound VAC application on 06/14/23. S/p I&D of wound on 06/17/23.   Clinical Impressions Scott Hale was seen for OT evaluation this date. Prior to hospital admission, pt was IND. Pt lives with partner in home c 1 STE. Pt currently requires MIN A squat pivot bed>w/c t/f no AD use, MIN A + RW step pivot t/f w/c>bed, CGA sit<>stand from w/c - cues t/o for safe w/c mgmt.   MIN A don/doff post op shoe in sitting. CGA for LB bathing in standing, maintains RLE NWBing. SUPERVISION seated grooming tasks. Pt would benefit from skilled OT to address noted impairments and functional limitations (see below for any additional details). Upon hospital discharge, recommend OT follow up.   If plan is discharge home, recommend the following:   A little help with walking and/or transfers;A little help with bathing/dressing/bathroom;Help with stairs or ramp for entrance     Functional Status Assessment   Patient has had a recent decline in their functional status and demonstrates the ability to make significant improvements in function in a reasonable and predictable amount of time.     Equipment Recommendations   BSC/3in1;Wheelchair (measurements OT);Wheelchair cushion (measurements OT)     Recommendations for Other Services         Precautions/Restrictions   Precautions Precautions: Fall Recall of Precautions/Restrictions: Intact Restrictions Weight Bearing Restrictions Per Provider Order: Yes RLE Weight Bearing Per Provider Order: Non weight bearing     Mobility Bed Mobility Overal bed mobility: Modified Independent                  Transfers Overall transfer  level: Needs assistance   Transfers: Sit to/from Stand, Bed to chair/wheelchair/BSC Sit to Stand: Contact guard assist   Squat pivot transfers: Min assist Step pivot transfers: Min assist     General transfer comment: cueing for w/c safety      Balance Overall balance assessment: Needs assistance Sitting-balance support: No upper extremity supported, Feet supported Sitting balance-Leahy Scale: Normal     Standing balance support: Single extremity supported, During functional activity Standing balance-Leahy Scale: Fair                             ADL either performed or assessed with clinical judgement   ADL Overall ADL's : Needs assistance/impaired                                       General ADL Comments: MIN A don/doff post op shoe in sitting. CGA for LB bathing in standing, maintains RLE NWBing. SUPERVISION seated grooming tasks.      Pertinent Vitals/Pain Pain Assessment Pain Assessment: No/denies pain     Extremity/Trunk Assessment Upper Extremity Assessment Upper Extremity Assessment: Overall WFL for tasks assessed   Lower Extremity Assessment Lower Extremity Assessment: Overall WFL for tasks assessed       Communication Communication Communication: No apparent difficulties   Cognition Arousal: Alert Behavior During Therapy: WFL for tasks assessed/performed Cognition: No apparent impairments  Following commands: Intact       Cueing  General Comments   Cueing Techniques: Verbal cues;Visual cues      Exercises     Shoulder Instructions      Home Living Family/patient expects to be discharged to:: Private residence Living Arrangements: Non-relatives/Friends Available Help at Discharge: Friend(s);Available 24 hours/day Type of Home: House Home Access: Stairs to enter [employer] of Steps: 1 Entrance Stairs-Rails: None Home Layout: Able to live on main level  with bedroom/bathroom               Home Equipment: None          Prior Functioning/Environment Prior Level of Function : Driving             Mobility Comments: Ind amb community distances without AD, no fall history, was going to gym weekly      OT Problem List: Decreased activity tolerance;Decreased safety awareness;Impaired balance (sitting and/or standing)   OT Treatment/Interventions: Self-care/ADL training;Therapeutic exercise;Energy conservation;DME and/or AE instruction;Therapeutic activities      OT Goals(Current goals can be found in the care plan section)   Acute Rehab OT Goals Patient Stated Goal: to go home OT Goal Formulation: With patient Time For Goal Achievement: 07/01/23 Potential to Achieve Goals: Good ADL Goals Pt Will Perform Grooming: with modified independence;standing;sitting Pt Will Perform Lower Body Dressing: sit to/from stand;with modified independence;with adaptive equipment Pt Will Transfer to Toilet: regular height toilet;stand pivot transfer;with modified independence Additional ADL Goal #1: Pt will independently report x3 w/c safety techniques for falls prevention   OT Frequency:  Min 3X/week    Co-evaluation              AM-PAC OT "6 Clicks" Daily Activity     Outcome Measure Help from another person eating meals?: None Help from another person taking care of personal grooming?: A Little Help from another person toileting, which includes using toliet, bedpan, or urinal?: A Little Help from another person bathing (including washing, rinsing, drying)?: A Little Help from another person to put on and taking off regular upper body clothing?: None Help from another person to put on and taking off regular lower body clothing?: A Little 6 Click Score: 20   End of Session Equipment Utilized During Treatment: Rolling walker (2 wheels);Other (comment) (w/c) Nurse Communication: Mobility status  Activity Tolerance: Patient  tolerated treatment well Patient left: in bed;with call bell/phone within reach;with bed alarm set;with nursing/sitter in room  OT Visit Diagnosis: Other abnormalities of gait and mobility (R26.89);Muscle weakness (generalized) (M62.81)                Time: 9562-1308 OT Time Calculation (min): 24 min Charges:  OT General Charges $OT Visit: 1 Visit OT Evaluation $OT Eval Moderate Complexity: 1 Mod OT Treatments $Self Care/Home Management : 8-22 mins  Gordan Latina, M.S. OTR/L  06/17/23, 2:21 PM  ascom [phone]

## 2023-06-17 NOTE — Anesthesia Preprocedure Evaluation (Signed)
 Anesthesia Evaluation  Patient identified by MRN, date of birth, ID band Patient awake    Reviewed: Allergy & Precautions, NPO status , Patient's Chart, lab work & pertinent test results  History of Anesthesia Complications Negative for: history of anesthetic complications  Airway Mallampati: III  TM Distance: >3 FB Neck ROM: full    Dental  (+) Chipped, Poor Dentition, Implants   Pulmonary neg shortness of breath, sleep apnea    Pulmonary exam normal breath sounds clear to auscultation       Cardiovascular Exercise Tolerance: Good hypertension, (-) angina + CABG, + Peripheral Vascular Disease and +CHF  (-) Past MI Normal cardiovascular exam Rhythm:Regular Rate:Normal     Neuro/Psych negative neurological ROS  negative psych ROS   GI/Hepatic negative GI ROS, Neg liver ROS,neg GERD  ,,  Endo/Other  diabetes, Type 2  Unsure of last trulicity dose. Denies GI symptoms today  Renal/GU negative Renal ROS  negative genitourinary   Musculoskeletal   Abdominal  (+) + obese  Peds  Hematology negative hematology ROS (+)   Anesthesia Other Findings Past Medical History: No date: Arthritis No date: CHF (congestive heart failure) (HCC) No date: Diabetes mellitus without complication (HCC) No date: Erectile dysfunction No date: History of kidney stones No date: Hypertension No date: Sleep apnea  Past Surgical History: No date: CARDIAC CATHETERIZATION 03/07/2021: COLONOSCOPY; N/A     Comment:  Procedure: COLONOSCOPY;  Surgeon: [name],               MD;  Location: [facility] ENDOSCOPY;  Service: Endoscopy;                Laterality: N/A;  IDDM 04/09/2017: COLONOSCOPY WITH PROPOFOL ; N/A     Comment:  Procedure: COLONOSCOPY WITH PROPOFOL ;  Surgeon: [name], MD;  Location: [facility] ENDOSCOPY;  Service:               Gastroenterology;  Laterality: N/A; 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 No date: JOINT  REPLACEMENT     Comment:  total knee 06/13/2023: LOWER EXTREMITY ANGIOGRAPHY; Right     Comment:  Procedure: Lower Extremity Angiography;  Surgeon: Celso College, MD;  Location: [facility] INVASIVE CV LAB;  Service:               Cardiovascular;  Laterality: Right; No date: RENAL ARTERY STENT     Comment:  due to kidney stones  BMI    Body Mass Index: 32.29 kg/m      Reproductive/Obstetrics negative OB ROS                             Anesthesia Physical Anesthesia Plan  ASA: 3  Anesthesia Plan: General   Post-op Pain Management: Minimal or no pain anticipated   Induction: Intravenous  PONV Risk Score and Plan: 2 and Propofol  infusion and TIVA  Airway Management Planned: Natural Airway and Nasal Cannula  Additional Equipment: None  Intra-op Plan:   Post-operative Plan:   Informed Consent: I have reviewed the patients History and Physical, chart, labs and discussed the procedure including the risks, benefits and alternatives for the proposed anesthesia with the patient or authorized representative who has indicated his/her understanding and acceptance.     Dental Advisory Given  Plan Discussed with: Anesthesiologist, CRNA and Surgeon  Anesthesia Plan Comments: (Patient consented for risks of anesthesia including but not limited to:  - adverse reactions to medications - risk of airway placement if required - damage to eyes, teeth, lips or other oral mucosa - nerve damage due to positioning  - sore throat or hoarseness - Damage to heart, brain, nerves, lungs, other parts of body or loss of life  Patient voiced understanding and assent.)       Anesthesia Quick Evaluation

## 2023-06-17 NOTE — Progress Notes (Signed)
 Dressing is intact with dried drainage noted. Theres output in the canister of the wound vac which was marked this morning during the event. No new drainage in the canister since then.   Notified Mansy, MD to assess pt. at the bedside. H/H ordered and picture of R foot updated in EPIC. Reached out to [employer], MD; who is the on-call  provider for Podiatry serves. Awaiting for response.  Need orders on treatment plan and maintenance.   R Foot reinforced by 4x4 guaze and gauze roll. Secured with tape.

## 2023-06-18 ENCOUNTER — Encounter: Payer: Self-pay | Admitting: Podiatry

## 2023-06-18 DIAGNOSIS — E1140 Type 2 diabetes mellitus with diabetic neuropathy, unspecified: Secondary | ICD-10-CM | POA: Diagnosis not present

## 2023-06-18 DIAGNOSIS — E1152 Type 2 diabetes mellitus with diabetic peripheral angiopathy with gangrene: Secondary | ICD-10-CM | POA: Diagnosis not present

## 2023-06-18 LAB — BASIC METABOLIC PANEL WITH GFR
Anion gap: 9 (ref 5–15)
BUN: 15 mg/dL (ref 8–23)
CO2: 24 mmol/L (ref 22–32)
Calcium: 8.6 mg/dL — ABNORMAL LOW (ref 8.9–10.3)
Chloride: 96 mmol/L — ABNORMAL LOW (ref 98–111)
Creatinine, Ser: 1.36 mg/dL — ABNORMAL HIGH (ref 0.61–1.24)
GFR, Estimated: 56 mL/min — ABNORMAL LOW (ref >60)
Glucose, Bld: 156 mg/dL — ABNORMAL HIGH (ref 70–99)
Potassium: 4.4 mmol/L (ref 3.5–5.1)
Sodium: 129 mmol/L — ABNORMAL LOW (ref 135–145)

## 2023-06-18 LAB — CBC WITH DIFFERENTIAL/PLATELET
Abs Immature Granulocytes: 0.06 10*3/uL (ref 0.00–0.07)
Basophils Absolute: 0.1 10*3/uL (ref 0.0–0.1)
Basophils Relative: 1 %
Eosinophils Absolute: 0.2 10*3/uL (ref 0.0–0.5)
Eosinophils Relative: 1 %
HCT: 32.5 % — ABNORMAL LOW (ref 39.0–52.0)
Hemoglobin: 10.7 g/dL — ABNORMAL LOW (ref 13.0–17.0)
Immature Granulocytes: 0 %
Lymphocytes Relative: 16 %
Lymphs Abs: 2.1 10*3/uL (ref 0.7–4.0)
MCH: 30.1 pg (ref 26.0–34.0)
MCHC: 32.9 g/dL (ref 30.0–36.0)
MCV: 91.5 fL (ref 80.0–100.0)
Monocytes Absolute: 1 10*3/uL (ref 0.1–1.0)
Monocytes Relative: 7 %
Neutro Abs: 10.1 10*3/uL — ABNORMAL HIGH (ref 1.7–7.7)
Neutrophils Relative %: 75 %
Platelets: 396 10*3/uL (ref 150–400)
RBC: 3.55 MIL/uL — ABNORMAL LOW (ref 4.22–5.81)
RDW: 13.6 % (ref 11.5–15.5)
WBC: 13.5 10*3/uL — ABNORMAL HIGH (ref 4.0–10.5)
nRBC: 0 % (ref 0.0–0.2)

## 2023-06-18 LAB — GLUCOSE, CAPILLARY
Glucose-Capillary: 165 mg/dL — ABNORMAL HIGH (ref 70–99)
Glucose-Capillary: 233 mg/dL — ABNORMAL HIGH (ref 70–99)
Glucose-Capillary: 138 mg/dL — ABNORMAL HIGH (ref 70–99)
Glucose-Capillary: 139 mg/dL — ABNORMAL HIGH (ref 70–99)

## 2023-06-18 MED ORDER — ENOXAPARIN SODIUM 60 MG/0.6ML IJ SOSY
0.5000 mg/kg | PREFILLED_SYRINGE | INTRAMUSCULAR | Status: DC
  Administered 2023-06-18 – 2023-06-19 (×2): 55 mg via SUBCUTANEOUS
  Filled 2023-06-18 (×2): qty 0.6

## 2023-06-18 MED ORDER — ASPIRIN 81 MG PO TBEC
81.0000 mg | DELAYED_RELEASE_TABLET | Freq: Every day | ORAL | Status: DC
  Administered 2023-06-18 – 2023-06-19 (×2): 81 mg via ORAL
  Filled 2023-06-18 (×2): qty 1

## 2023-06-18 MED ORDER — CLOPIDOGREL BISULFATE 75 MG PO TABS
75.0000 mg | ORAL_TABLET | Freq: Every day | ORAL | Status: DC
  Administered 2023-06-18 – 2023-06-19 (×2): 75 mg via ORAL
  Filled 2023-06-18 (×2): qty 1

## 2023-06-18 NOTE — TOC Progression Note (Signed)
 Transition of Care [facility]) - Progression Note    Patient Details  Name: Gibran Kliethermes MRN: [id number] Date of Birth: [birth year]  Transition of Care [facility]) CM/SW Contact  Alexandra Ice, RN Phone Number: 06/18/2023, 4:04 PM  Clinical Narrative:      PT/OT recommends home health, patient has wound vac. TOC will send referrals.      Expected Discharge Plan and Services                                               Social Determinants of Health (SDOH) Interventions SDOH Screenings   Food Insecurity: Food Insecurity Present (06/08/2023)  Housing: Low Risk  (06/08/2023)  Recent Concern: Housing - High Risk (05/27/2023)   Received from [facility]  Transportation Needs: No Transportation Needs (06/08/2023)  Utilities: At Risk (06/08/2023)  Depression (PHQ2-9): Low Risk  (09/25/2021)  Financial Resource Strain: Medium Risk (12/05/2021)   Received from [facility]  Physical Activity: Sufficiently Active (03/26/2017)  Social Connections: Moderately Isolated (06/08/2023)  Stress: No Stress Concern Present (03/26/2017)  Tobacco Use: Low Risk  (06/17/2023)    Readmission Risk Interventions     No data to display

## 2023-06-18 NOTE — Plan of Care (Signed)
  Problem: Education: Goal: Ability to describe self-care measures that may prevent or decrease complications (Diabetes Survival Skills Education) will improve Outcome: Progressing   Problem: Coping: Goal: Ability to adjust to condition or change in health will improve Outcome: Progressing   Problem: Fluid Volume: Goal: Ability to maintain a balanced intake and output will improve Outcome: Progressing   Problem: Health Behavior/Discharge Planning: Goal: Ability to identify and utilize available resources and services will improve Outcome: Progressing Goal: Ability to manage health-related needs will improve Outcome: Progressing   Problem: Metabolic: Goal: Ability to maintain appropriate glucose levels will improve Outcome: Progressing   Problem: Nutritional: Goal: Maintenance of adequate nutrition will improve Outcome: Progressing Goal: Progress toward achieving an optimal weight will improve Outcome: Progressing   Problem: Skin Integrity: Goal: Risk for impaired skin integrity will decrease Outcome: Progressing   Problem: Tissue Perfusion: Goal: Adequacy of tissue perfusion will improve Outcome: Progressing   Problem: Clinical Measurements: Goal: Ability to avoid or minimize complications of infection will improve Outcome: Progressing   Problem: Skin Integrity: Goal: Skin integrity will improve Outcome: Progressing   Problem: Skin Integrity: Goal: Risk for impaired skin integrity will decrease Outcome: Progressing   Problem: Tissue Perfusion: Goal: Adequacy of tissue perfusion will improve Outcome: Progressing   Problem: Clinical Measurements: Goal: Ability to avoid or minimize complications of infection will improve Outcome: Progressing   Problem: Skin Integrity: Goal: Skin integrity will improve Outcome: Progressing

## 2023-06-18 NOTE — Progress Notes (Signed)
 Physical Therapy Treatment Patient Details Name: Scott Hale MRN: [id number] DOB: 20-Feb-1950 Today's Date: 06/18/2023   History of Present Illness Scott Hale is a 73 y.o. male with medical history significant of DM2, HTN, CKD who presented with right foot pain. Underwent transmetatarsal amputation of right foot and wound VAC application on 06/14/23. S/p I&D of wound on 06/17/23.    PT Comments  Patient is agreeable to PT session. Reinforcement of technique to maintain NWB of RLE with transfers which patient demonstrated without difficulty. Standing activity tolerance/ambulation is limited by upper body fatigue with heavy reliance on rolling walker to maintain weight bearing status. Recommend wheelchair for longer distance mobility as needed for safety and to maintain NWB. PT will continue to follow to maximize independence and decrease caregiver burden.    If plan is discharge home, recommend the following: Assist for transportation;Assistance with cooking/housework;A little help with walking and/or transfers;A little help with bathing/dressing/bathroom;Help with stairs or ramp for entrance   Can travel by private vehicle        Equipment Recommendations  Rolling walker (2 wheels);BSC/3in1;Wheelchair (measurements PT)    Recommendations for Other Services       Precautions / Restrictions Precautions Precautions: Fall Recall of Precautions/Restrictions: Intact Restrictions Weight Bearing Restrictions Per Provider Order: Yes RLE Weight Bearing Per Provider Order: Non weight bearing     Mobility  Bed Mobility Overal bed mobility: Modified Independent                  Transfers Overall transfer level: Needs assistance Equipment used: Rolling walker (2 wheels) Transfers: Sit to/from Stand Sit to Stand: Supervision           General transfer comment: verbal and visual cues for technique to maintain NWB of RLE with transfers with carry over demonstrated without difficulty     Ambulation/Gait Ambulation/Gait assistance: Contact guard assist, Supervision Gait Distance (Feet): 3 Feet Assistive device: Rolling walker (2 wheels) Gait Pattern/deviations: Step-to pattern       General Gait Details: hop to pattern with no loss of balance using rolling walker. patient reports upper body fatigue with heavy reliance on rolling walker to maintain NWB with prolonged standing/ambulation attempts. recommend to use a wheelchair for longer distance mobility to maintain NWB   Stairs             Wheelchair Mobility     Tilt Bed    Modified Rankin (Stroke Patients Only)       Balance Overall balance assessment: Needs assistance Sitting-balance support: No upper extremity supported, Feet supported Sitting balance-Leahy Scale: Normal     Standing balance support: Bilateral upper extremity supported Standing balance-Leahy Scale: Poor Standing balance comment: heavy relinace on rolling walker for support in standing in order to maintain NWB of RLE                            Communication Communication Communication: No apparent difficulties  Cognition Arousal: Alert Behavior During Therapy: WFL for tasks assessed/performed   PT - Cognitive impairments: No apparent impairments                         Following commands: Intact      Cueing Cueing Techniques: Verbal cues, Visual cues  Exercises      General Comments        Pertinent Vitals/Pain Pain Assessment Pain Assessment: No/denies pain    Home Living  Prior Function            PT Goals (current goals can now be found in the care plan section) Acute Rehab PT Goals Patient Stated Goal: to go home PT Goal Formulation: With patient Time For Goal Achievement: 06/28/23 Potential to Achieve Goals: Good Progress towards PT goals: Progressing toward goals    Frequency    7X/week      PT Plan      Co-evaluation               AM-PAC PT "6 Clicks" Mobility   Outcome Measure  Help needed turning from your back to your side while in a flat bed without using bedrails?: A Little Help needed moving from lying on your back to sitting on the side of a flat bed without using bedrails?: A Little Help needed moving to and from a bed to a chair (including a wheelchair)?: A Little Help needed standing up from a chair using your arms (e.g., wheelchair or bedside chair)?: A Little Help needed to walk in hospital room?: A Little Help needed climbing 3-5 steps with a railing? : A Lot 6 Click Score: 17    End of Session   Activity Tolerance: Patient tolerated treatment well Patient left: in chair;with call bell/phone within reach Nurse Communication: Mobility status PT Visit Diagnosis: Other abnormalities of gait and mobility (R26.89);Muscle weakness (generalized) (M62.81)     Time: 0865-7846 PT Time Calculation (min) (ACUTE ONLY): 12 min  Charges:    $Therapeutic Activity: 8-22 mins PT General Charges $$ ACUTE PT VISIT: 1 Visit                    Scott Hale, PT, MPT    Scott Hale 06/18/2023, 11:16 AM

## 2023-06-18 NOTE — Progress Notes (Signed)
 PROGRESS NOTE    Scott Hale  ZOX:[id number] DOB: 1950-02-22 DOA: 06/07/2023 PCP: Lyle San, MD    Brief Narrative:   Scott Hale is a 73 y.o. male with medical history significant of DM2, HTN, CKD who presented with right foot pain.   Underwent transmetatarsal amputation of right foot and wound VAC application on 5/2  5/4: Bleeding noted from surgical site.  Hemoglobin relatively stable.  Vital signs remained stable.  Patient feels okay  5/5: S/p OR for wound washout and closure.  Discussed with podiatry.   Assessment & Plan:   Principal Problem:   Cellulitis Active Problems:   Gangrene of toe of right foot (HCC)   PAD (peripheral artery disease) (HCC)   Cellulitis of right foot   Diabetic foot infection (HCC)   Gangrene of right foot (HCC)  RLE cellulitis  Possible osteomyelitis and septic arthritis Postoperative bleeding --received vanc/cefe/flagyl  in the ED, then switched to Ancef .  No open wounds on presentation, but wounds opened up in between toes on 4/28.  ID and podiatry consulted.  Abx switched to abx to Unasyn  and Zyvox , per ID. --MRI right foot showed multiple areas of osteomyelitis -- Bleeding noted from surgical site 5/4 AM.  Heparin  GTT stopped --Status post return to the OR 5/5 for wound washout and closure Plan: Continue IV antibiotics per ID recommendation, was on Zosyn  and Zyvox  through 5/5.  Can switch to Ancef  and Flagyl .  Continue local wound care.  Resume antiplatelet agents today.  Aspirin  and Plavix .  Need ID follow-up for further antimicrobial strategies.  PAD --ABI with Findings indicative of moderate right and mild left lower extremity peripheral arterial disease.  --Vascular surgery consulted --S/p angio with stent placement x 2 Plan: Resume aspirin  and Plavix  DAPT   Severe Sepsis Sepsis physiology resolved.  On IV antibiotics as above.   DM2 --recent A1c 7.6.  Pt reported taking Levemir 80u nightly at home. --Continue glargine to  45u nightly --ACHS and SSI   HTN -- Continue coreg    AKI on CKD 3a --Cr 1.85 on presentation, improved to 1.37 with IVF. --oral hydration now --No indication for IVF   Hyponatremia --of unclear significance   DVT prophylaxis: On hold.  Subcu Lovenox  to start 5/6 Code Status: Full Family Communication: Friend at bedside 4/30, 5/1, 5/5 Disposition Plan: Status is: Inpatient Remains inpatient appropriate because: Lower extremity osteomyelitis   Level of care: Med-Surg  Consultants:  ID Podiatry Vascular surgery  Procedures:  Lower extremity angiogram 5/1 Right TMA 5/2  Antimicrobials: Ancef  Flagyl    Subjective: Seen and examined.  Sitting up in chair.  Feels well overall.  No complaints.  Objective: Vitals:   06/17/23 1959 06/17/23 2140 06/18/23 0413 06/18/23 0846  BP: 108/61 110/68 115/71 114/69  Pulse: 73 70 67 76  Resp: 16  18 18   Temp: 99.6 F (37.6 C)   98.1 F (36.7 C)  TempSrc:      SpO2: 98%  98% 99%  Weight:      Height:        Intake/Output Summary (Last 24 hours) at 06/18/2023 1321 Last data filed at 06/18/2023 1100 Gross per 24 hour  Intake 97.46 ml  Output 4425 ml  Net -4327.54 ml   Filed Weights   06/13/23 0927 06/14/23 0739 06/17/23 0650  Weight: 108 kg 108 kg 108 kg    Examination:  General exam: No acute distress Respiratory system: Lungs clear.  Normal work of breathing.  Room air Cardiovascular system: S1-S2, RRR, no murmurs,  no pedal edema Gastrointestinal system: Soft, NT/ND, normal bowel Central nervous system: Alert and oriented. No focal neurological deficits. Extremities: Status post right TMA.  Wound dressed.  Dressings clean dry and intact Skin: No rashes, lesions or ulcers Psychiatry: Judgement and insight appear normal. Mood & affect appropriate.     Data Reviewed: I have personally reviewed following labs and imaging studies  CBC: Recent Labs  Lab 06/13/23 0514 06/15/23 0410 06/16/23 0241 06/16/23 1216  06/16/23 2035 06/17/23 0323 06/18/23 0826  WBC 17.2* 17.5* 14.0*  --   --  10.3 13.5*  NEUTROABS  --   --   --   --   --   --  10.1*  HGB 10.5* 10.5* 10.2* 10.6* 10.6* 11.1* 10.7*  HCT 31.1* 32.1* 30.8* 32.1* 32.4* 33.9* 32.5*  MCV 90.4 91.2 91.1  --   --  92.4 91.5  PLT 326 349 353  --   --  418* 396   Basic Metabolic Panel: Recent Labs  Lab 06/12/23 0508 06/14/23 2138 06/18/23 0826  NA 134*  --  129*  K 4.2  --  4.4  CL 99  --  96*  CO2 25  --  24  GLUCOSE 100* 442* 156*  BUN 11  --  15  CREATININE 1.18  --  1.36*  CALCIUM  8.8*  --  8.6*   GFR: Estimated Creatinine Clearance: 64.2 mL/min (A) (by C-G formula based on SCr of 1.36 mg/dL (H)). Liver Function Tests: No results for input(s): "AST", "ALT", "ALKPHOS", "BILITOT", "PROT", "ALBUMIN" in the last 168 hours. No results for input(s): "LIPASE", "AMYLASE" in the last 168 hours. No results for input(s): "AMMONIA" in the last 168 hours. Coagulation Profile: Recent Labs  Lab 06/12/23 1209  INR 1.3*   Cardiac Enzymes: No results for input(s): "CKTOTAL", "CKMB", "CKMBINDEX", "TROPONINI" in the last 168 hours. BNP (last 3 results) No results for input(s): "PROBNP" in the last 8760 hours. HbA1C: No results for input(s): "HGBA1C" in the last 72 hours.  CBG: Recent Labs  Lab 06/17/23 1646 06/17/23 1958 06/17/23 2142 06/18/23 0827 06/18/23 1156  GLUCAP 146* 173* 160* 165* 233*   Lipid Profile: No results for input(s): "CHOL", "HDL", "LDLCALC", "TRIG", "CHOLHDL", "LDLDIRECT" in the last 72 hours. Thyroid Function Tests: No results for input(s): "TSH", "T4TOTAL", "FREET4", "T3FREE", "THYROIDAB" in the last 72 hours. Anemia Panel: No results for input(s): "VITAMINB12", "FOLATE", "FERRITIN", "TIBC", "IRON", "RETICCTPCT" in the last 72 hours. Sepsis Labs: No results for input(s): "PROCALCITON", "LATICACIDVEN" in the last 168 hours.   Recent Results (from the past 240 hours)  Aerobic Culture w Gram Stain  (superficial specimen)     Status: None   Collection Time: 06/10/23  7:35 PM   Specimen: Wound  Result Value Ref Range Status   Specimen Description   Final    WOUND Performed at [facility], [address] [place], [place] [address]    Special Requests   Final    RIGHT FOOT Performed at [facility], [address] [place], [place] [address]    Gram Stain NO WBC SEEN FEW GRAM POSITIVE COCCI IN PAIRS   Final   Culture   Final    RARE GROUP B STREP(S.AGALACTIAE)ISOLATED TESTING AGAINST S. AGALACTIAE NOT ROUTINELY PERFORMED DUE TO PREDICTABILITY OF AMP/PEN/VAN SUSCEPTIBILITY. WITHIN MIXED NORMAL SKIN FLORA Performed at [facility], [address] [place], [place] [address]    Report Status 06/13/2023 FINAL  Final  Aerobic/Anaerobic Culture w Gram Stain (surgical/deep wound)  Status: None (Preliminary result)   Collection Time: 06/14/23  8:45 AM   Specimen: Foot, Right; Amputation  Result Value Ref Range Status   Specimen Description   Final    TISSUE Performed at [facility], [address] [place], [place] [address]    Special Requests   Final    RIGHT FOOT TISSUE CULTURE Performed at [facility], [address] [place], [place] [address]    Gram Stain   Final    FEW WBC PRESENT, PREDOMINANTLY PMN NO ORGANISMS SEEN Performed at [facility], [address], [place], [place] [address]    Culture   Final    RARE STAPHYLOCOCCUS AUREUS NO ANAEROBES ISOLATED; CULTURE IN PROGRESS FOR 5 DAYS    Report Status PENDING  Incomplete   Organism ID, Bacteria STAPHYLOCOCCUS AUREUS  Final      Susceptibility   Staphylococcus aureus - MIC*    CIPROFLOXACIN <=0.5 SENSITIVE Sensitive     ERYTHROMYCIN <=0.25 SENSITIVE Sensitive     GENTAMICIN <=0.5 SENSITIVE Sensitive     OXACILLIN 0.5 SENSITIVE Sensitive     TETRACYCLINE <=1 SENSITIVE Sensitive     VANCOMYCIN  1 SENSITIVE Sensitive     TRIMETH /SULFA  <=10 SENSITIVE  Sensitive     CLINDAMYCIN <=0.25 SENSITIVE Sensitive     RIFAMPIN <=0.5 SENSITIVE Sensitive     Inducible Clindamycin NEGATIVE Sensitive     LINEZOLID  2 SENSITIVE Sensitive     * RARE STAPHYLOCOCCUS AUREUS  Aerobic/Anaerobic Culture w Gram Stain (surgical/deep wound)     Status: None (Preliminary result)   Collection Time: 06/14/23  8:52 AM   Specimen: Foot, Right; Blood  Result Value Ref Range Status   Specimen Description   Final    FOOT Performed at [facility], [address], [place], [place] [address]    Special Requests RIGHT FOOT  Final   Gram Stain   Final    NO WBC SEEN NO ORGANISMS SEEN Performed at [facility], [address], [place], [place] [address]    Culture   Final    RARE STAPHYLOCOCCUS AUREUS SUSCEPTIBILITIES PERFORMED ON PREVIOUS CULTURE WITHIN THE LAST 5 DAYS. NO ANAEROBES ISOLATED; CULTURE IN PROGRESS FOR 5 DAYS    Report Status PENDING  Incomplete         Radiology Studies: DG Abd 1 View Result Date: 06/17/2023 CLINICAL DATA:  Constipation EXAM: ABDOMEN - 1 VIEW COMPARISON:  None Available. FINDINGS: The bowel gas pattern is normal. No radio-opaque calculi or other significant radiographic abnormality are seen. 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 Electronically Signed   By: Fredrich Jefferson M.D.   On: 06/17/2023 16:39   DG Foot 2 Views Right Result Date: 06/17/2023 CLINICAL DATA:  Status post right foot surgery. EXAM: RIGHT FOOT - 2 VIEW COMPARISON:  Right foot radiographs 06/14/2023 FINDINGS: There is repeat amputation of the first through fifth rays, now to the proximal metatarsal shafts. Interval removal of the prior distal foot wound VAC. New distal postoperative fat surgical skin staples. No subcutaneous air. No definite cortical  erosion. Mild dorsal talonavicular and navicular-cuneiform joint space narrowing and peripheral osteophytosis, unchanged. IMPRESSION: Interval revision amputation of the first through fifth rays, now to the proximal metatarsal shafts. No abnormal finding is seen. Electronically Signed   By: Loanne Rim.D.  On: 06/17/2023 09:03         Scheduled Meds:  aspirin  EC  81 mg Oral Daily   atorvastatin   40 mg Oral Daily   carvedilol   3.125 mg Oral BID WC   clopidogrel   75 mg Oral Daily   enoxaparin  (LOVENOX ) injection  0.5 mg/kg Subcutaneous Q24H   furosemide   40 mg Oral Daily   insulin  aspart  0-9 Units Subcutaneous TID WC   insulin  glargine-yfgn  45 Units Subcutaneous QHS   metroNIDAZOLE   500 mg Oral Q12H   sacubitril -valsartan   1 tablet Oral BID   senna-docusate  1 tablet Oral BID   spironolactone   25 mg Oral Daily   Continuous Infusions:   ceFAZolin  (ANCEF ) IV 2 g (06/18/23 1306)     LOS: 10 days    Tiajuana Fluke, MD [facility]   If 7PM-7AM, please contact night-coverage  06/18/2023, 1:21 PM

## 2023-06-18 NOTE — Progress Notes (Signed)
 Date of Admission:  06/07/2023     ID: Scott Hale is a 73 y.o. 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 The surgical note says there was significant purulence, infection and a wound VAC was placed Pt underwent repeat I/D and graft application 06/17/23 Patient is feeling fine  Medications:   aspirin  EC  81 mg Oral Daily   atorvastatin   40 mg Oral Daily   carvedilol   3.125 mg Oral BID WC   clopidogrel   75 mg Oral Daily   enoxaparin  (LOVENOX ) injection  0.5 mg/kg Subcutaneous Q24H   furosemide   40 mg Oral Daily   insulin  aspart  0-9 Units Subcutaneous TID WC   insulin  glargine-yfgn  45 Units Subcutaneous QHS   metroNIDAZOLE   500 mg Oral Q12H   sacubitril -valsartan   1 tablet Oral BID   senna-docusate  1 tablet Oral BID   spironolactone   25 mg Oral Daily    Objective: Vital signs in last 24 hours: Patient Vitals for the past 24 hrs:  BP Temp Pulse Resp SpO2  06/18/23 0846 114/69 98.1 F (36.7 C) 76 18 99 %  06/18/23 0413 115/71 -- 67 18 98 %  06/17/23 2140 110/68 -- 70 -- --  06/17/23 1959 108/61 99.6 F (37.6 C) 73 16 98 %  06/17/23 1514 127/78 98.2 F (36.8 C) 77 16 99 %       PHYSICAL EXAM:  General: Alert, cooperative, no distress, appears stated age.  Lungs: Clear to auscultation bilaterally. No Wheezing or Rhonchi. No rales. Heart: Regular rate and rhythm, no murmur, rub or gallop. Abdomen: Soft, non-tender,not distended. Bowel sounds normal. No masses Extremities: S/p TMA- surgical dresisng not removed Skin: No rashes or lesions. Or bruising Lymph: Cervical, supraclavicular normal. Neurologic: Grossly non-focal  Lab Results    Latest Ref Rng & Units 06/18/2023    8:26 AM 06/17/2023    3:23 AM 06/16/2023     8:35 PM  CBC  WBC 4.0 - 10.5 K/uL 13.5  10.3    Hemoglobin 13.0 - 17.0 g/dL 16.1  09.6  04.5   Hematocrit 39.0 - 52.0 % 32.5  33.9  32.4   Platelets 150 - 400 K/uL 396  418         Latest Ref Rng & Units 06/18/2023    8:26 AM 06/14/2023    9:38 PM 06/12/2023    5:08 AM  CMP  Glucose 70 - 99 mg/dL 409  811  914   BUN 8 - 23 mg/dL 15   11   Creatinine 7.82 - 1.24 mg/dL 9.56   2.13   Sodium 086 - 145 mmol/L 129   134   Potassium 3.5 - 5.1 mmol/L 4.4   4.2   Chloride 98 - 111 mmol/L 96   99   CO2 22 - 32 mmol/L 24   25   Calcium  8.9 - 10.3 mg/dL 8.6   8.8           Assessment/Plan: 73 yr male with DM presents with pain rt foot of 2 weeks duration   Rt foot infection- diabetic/PAD/ neuropathy Rt foot swelling and erythema and tenderness with interdigital cleft maceration between 3/4 th toe with discharge 4th toe discolored  soft tissue  infection of the rt foot now with gangrene of 3/4 toes - MRI showed non viable [place] tissue extending from the interdital cleft 3/4 along the dorsum S/p TMA- has wound vac Culture MSSA On cefazolin  and flagyl  Path clear margin On discharge can be switched over to p.o. Augmentin 875 mg twice daily for 2 -4 weeks depending on how the wound is healing.   Anemia  PAD- underwent angio and had angioplasty and stent placement of rt ATA, SFA, popliteal  DM- no recent A1c   Htn   CAD s/p CABG   RT TKA) no evidence of PJI ? Discussed the management with the patient and partner at bedside

## 2023-06-19 ENCOUNTER — Other Ambulatory Visit: Payer: Self-pay

## 2023-06-19 DIAGNOSIS — E1140 Type 2 diabetes mellitus with diabetic neuropathy, unspecified: Secondary | ICD-10-CM | POA: Diagnosis not present

## 2023-06-19 DIAGNOSIS — E1152 Type 2 diabetes mellitus with diabetic peripheral angiopathy with gangrene: Secondary | ICD-10-CM | POA: Diagnosis not present

## 2023-06-19 LAB — CBC WITH DIFFERENTIAL/PLATELET
Abs Immature Granulocytes: 0.11 10*3/uL — ABNORMAL HIGH (ref 0.00–0.07)
Basophils Absolute: 0.1 10*3/uL (ref 0.0–0.1)
Basophils Relative: 0 %
Eosinophils Absolute: 0.2 10*3/uL (ref 0.0–0.5)
Eosinophils Relative: 1 %
HCT: 30.1 % — ABNORMAL LOW (ref 39.0–52.0)
Hemoglobin: 9.8 g/dL — ABNORMAL LOW (ref 13.0–17.0)
Immature Granulocytes: 1 %
Lymphocytes Relative: 19 %
Lymphs Abs: 2.5 10*3/uL (ref 0.7–4.0)
MCH: 30.2 pg (ref 26.0–34.0)
MCHC: 32.6 g/dL (ref 30.0–36.0)
MCV: 92.6 fL (ref 80.0–100.0)
Monocytes Absolute: 1 10*3/uL (ref 0.1–1.0)
Monocytes Relative: 8 %
Neutro Abs: 9.1 10*3/uL — ABNORMAL HIGH (ref 1.7–7.7)
Neutrophils Relative %: 71 %
Platelets: 417 10*3/uL — ABNORMAL HIGH (ref 150–400)
RBC: 3.25 MIL/uL — ABNORMAL LOW (ref 4.22–5.81)
RDW: 13.6 % (ref 11.5–15.5)
WBC: 12.9 10*3/uL — ABNORMAL HIGH (ref 4.0–10.5)
nRBC: 0 % (ref 0.0–0.2)

## 2023-06-19 LAB — AEROBIC/ANAEROBIC CULTURE W GRAM STAIN (SURGICAL/DEEP WOUND): Gram Stain: NONE SEEN

## 2023-06-19 LAB — BASIC METABOLIC PANEL WITH GFR
Anion gap: 8 (ref 5–15)
BUN: 17 mg/dL (ref 8–23)
CO2: 25 mmol/L (ref 22–32)
Calcium: 8.7 mg/dL — ABNORMAL LOW (ref 8.9–10.3)
Chloride: 98 mmol/L (ref 98–111)
Creatinine, Ser: 1.29 mg/dL — ABNORMAL HIGH (ref 0.61–1.24)
GFR, Estimated: 60 mL/min — ABNORMAL LOW (ref >60)
Glucose, Bld: 123 mg/dL — ABNORMAL HIGH (ref 70–99)
Potassium: 4.6 mmol/L (ref 3.5–5.1)
Sodium: 131 mmol/L — ABNORMAL LOW (ref 135–145)

## 2023-06-19 LAB — GLUCOSE, CAPILLARY
Glucose-Capillary: 229 mg/dL — ABNORMAL HIGH (ref 70–99)
Glucose-Capillary: 159 mg/dL — ABNORMAL HIGH (ref 70–99)

## 2023-06-19 MED ORDER — AMOXICILLIN-POT CLAVULANATE 875-125 MG PO TABS
1.0000 | ORAL_TABLET | Freq: Two times a day (BID) | ORAL | 0 refills | Status: AC
  Filled 2023-06-19: qty 28, 14d supply, fill #0

## 2023-06-19 MED ORDER — CLOPIDOGREL BISULFATE 75 MG PO TABS
75.0000 mg | ORAL_TABLET | Freq: Every day | ORAL | 0 refills | Status: AC
  Filled 2023-06-19: qty 30, 30d supply, fill #0

## 2023-06-19 MED ORDER — INSULIN GLARGINE 100 UNIT/ML SOLOSTAR PEN
45.0000 [IU] | PEN_INJECTOR | Freq: Every day | SUBCUTANEOUS | 0 refills | Status: AC
  Filled 2023-06-19: qty 15, 33d supply, fill #0

## 2023-06-19 NOTE — Progress Notes (Signed)
 Patient is not able to walk the distance required to go the bathroom, or he/she is unable to safely negotiate stairs required to access the bathroom.  A 3in1 BSC will alleviate this problem

## 2023-06-19 NOTE — Progress Notes (Signed)
  Subjective:  Patient ID: Scott Hale, male    DOB: 05-15-1950,  MRN: [id number]  Chief Complaint  Patient presents with   Foot Pain    DOS: 06-14-23 Procedure: 1.  Transmetatarsal amputation, right foot 2.  Wound VAC application 9 x 6 cm, right foot     DOS: 06/17/23 Procedure: 1.  Repeat irrigation and debridement of transmetatarsal amputation site with prep for graft application, right foot 2.  Graft application 38 cm, right foot 3.  Delayed primary closure 9 x 6 x 4 cm, right foot  73 y.o. male seen for post op check. He reports he is doing well denies pain. Discussed plans for follow up - says he prefers [place] location and said will have office call to set up. He is aware to remain NWB to RLE.   Review of Systems: Negative except as noted in the HPI. Denies N/V/F/Ch.   Objective:   Vitals:   06/19/23 0222 06/19/23 0746  BP: 123/71 117/70  Pulse: 63 60  Resp: 18 16  Temp: 98.1 F (36.7 C) 98.6 F (37 C)  SpO2: 98% 99%   Body mass index is 32.29 kg/m. Constitutional Well developed. Well nourished.  Vascular Foot warm and well perfused. Capillary refill normal to all digits.   No calf pain with palpation  Neurologic Normal speech. Oriented to person, place, and time. Epicritic sensation diminished to right foot  Dermatologic Amp site healing well, no dehiscence, no signficant necrosis, well coap ted without maceration or drainage. No evidence infection.      Orthopedic: S/p R foot TMA   Radiographs: Interval revision amputation of the first through fifth rays, now to the proximal metatarsal shafts. No abnormal finding is seen.  Pathology:  1. Foot, amputation, right :       - SKIN WITH ULCERATION AND ASSOCIATED NECROTIZING INFLAMMATION       - SOFT TISSUE MARGIN IS POSITIVE FOR ACUTE INFLAMMATION       - BONE AT THE RESECTION MARGIN DOES NOT SHOW EVIDENCE OF ACUTE OSTEOMYELITIS   Micro: MSSA  Assessment:   Gangrene right foot s/p TMA  Plan:   Patient was evaluated and treated and all questions answered.  POD # 2 s/p R foot repeat debridement with graft and delayed closure of right foot TMA site -Progressing well post op no evidence necrosis or infection at amp site -XR: expected post op changes -WB Status: NWB in post op shoe to RLE -Sutures: remain intact 2-3 weeks. -Medications/ABX: Per ID, appreciate recs, 2 weeks augmentin on DC -Foot redressed - leave this dsg intact until follow up in 1 week office will call to arrange. - Return in 1 week to [place] clinic, stable for DC, will sign off at this time        Maridee Shoemaker, DPM [facility] / [facility]

## 2023-06-19 NOTE — Plan of Care (Signed)
  Problem: Education: Goal: Ability to describe self-care measures that may prevent or decrease complications (Diabetes Survival Skills Education) will improve Outcome: Adequate for Discharge   Problem: Coping: Goal: Ability to adjust to condition or change in health will improve Outcome: Adequate for Discharge   Problem: Fluid Volume: Goal: Ability to maintain a balanced intake and output will improve Outcome: Adequate for Discharge   Problem: Health Behavior/Discharge Planning: Goal: Ability to identify and utilize available resources and services will improve Outcome: Adequate for Discharge Goal: Ability to manage health-related needs will improve Outcome: Adequate for Discharge   Problem: Metabolic: Goal: Ability to maintain appropriate glucose levels will improve Outcome: Adequate for Discharge   Problem: Nutritional: Goal: Maintenance of adequate nutrition will improve Outcome: Adequate for Discharge Goal: Progress toward achieving an optimal weight will improve Outcome: Adequate for Discharge   Problem: Skin Integrity: Goal: Risk for impaired skin integrity will decrease Outcome: Adequate for Discharge   Problem: Tissue Perfusion: Goal: Adequacy of tissue perfusion will improve Outcome: Adequate for Discharge   Problem: Clinical Measurements: Goal: Ability to avoid or minimize complications of infection will improve Outcome: Adequate for Discharge   Problem: Skin Integrity: Goal: Skin integrity will improve Outcome: Adequate for Discharge   Problem: Education: Goal: Knowledge of General Education information will improve Description: Including pain rating scale, medication(s)/side effects and non-pharmacologic comfort measures Outcome: Adequate for Discharge   Problem: Health Behavior/Discharge Planning: Goal: Ability to manage health-related needs will improve Outcome: Adequate for Discharge   Problem: Clinical Measurements: Goal: Ability to maintain  clinical measurements within normal limits will improve Outcome: Adequate for Discharge Goal: Will remain free from infection Outcome: Adequate for Discharge Goal: Diagnostic test results will improve Outcome: Adequate for Discharge Goal: Respiratory complications will improve Outcome: Adequate for Discharge Goal: Cardiovascular complication will be avoided Outcome: Adequate for Discharge   Problem: Activity: Goal: Risk for activity intolerance will decrease Outcome: Adequate for Discharge   Problem: Nutrition: Goal: Adequate nutrition will be maintained Outcome: Adequate for Discharge   Problem: Coping: Goal: Level of anxiety will decrease Outcome: Adequate for Discharge   Problem: Elimination: Goal: Will not experience complications related to bowel motility Outcome: Adequate for Discharge Goal: Will not experience complications related to urinary retention Outcome: Adequate for Discharge   Problem: Pain Managment: Goal: General experience of comfort will improve and/or be controlled Outcome: Adequate for Discharge   Problem: Safety: Goal: Ability to remain free from injury will improve Outcome: Adequate for Discharge   Problem: Skin Integrity: Goal: Risk for impaired skin integrity will decrease Outcome: Adequate for Discharge

## 2023-06-19 NOTE — TOC Progression Note (Addendum)
 Transition of Care [facility]) - Progression Note    Patient Details  Name: Scott Hale MRN: [id number] Date of Birth: 21-Apr-1950  Transition of Care [facility]) CM/SW Contact  Alexandra Ice, RN Phone Number: 06/19/2023, 2:59 PM  Clinical Narrative:    Patient needing DME and home health. Spoke with patient and Kristeen Peto, SO, via phone they have no preference of which agencies to use as long as in-network with insurance. Sent referral to Jon with [facility] for DME, and sent home health referral to Shaun with [facility].   3:34p, received message from [place] with [facility], they are able to accept patient. [facility] is processing DME and will deliver to bedside.        Expected Discharge Plan and Services         Expected Discharge Date: 06/19/23                                     Social Determinants of Health (SDOH) Interventions SDOH Screenings   Food Insecurity: Food Insecurity Present (06/08/2023)  Housing: Low Risk  (06/08/2023)  Recent Concern: Housing - High Risk (05/27/2023)   Received from [facility]  Transportation Needs: No Transportation Needs (06/08/2023)  Utilities: At Risk (06/08/2023)  Depression (PHQ2-9): Low Risk  (09/25/2021)  Financial Resource Strain: Medium Risk (12/05/2021)   Received from [facility], [facility]  Physical Activity: Sufficiently Active (03/26/2017)  Social Connections: Moderately Isolated (06/08/2023)  Stress: No Stress Concern Present (03/26/2017)  Tobacco Use: Low Risk  (06/17/2023)    Readmission Risk Interventions     No data to display

## 2023-06-19 NOTE — Progress Notes (Signed)
 Physical Therapy Treatment Patient Details Name: Scott Hale MRN: [id number] DOB: 29-May-1950 Today's Date: 06/19/2023   History of Present Illness Scott Hale is a 73 y.o. male with medical history significant of DM2, HTN, CKD who presented with right foot pain. Underwent transmetatarsal amputation of right foot and wound VAC application on 06/14/23. S/p I&D of wound on 06/17/23.    PT Comments  Pt is making good progress towards goals with ability to ambulate in room short distance with RW. Fatigues quickly with limited endurance. Session focused on [facility] management and was able to self propel in hallway with cues/assist as needed. Pt will need WC for home use and discussed the need for supervision with ambulation. Will continue to progress as needed.    If plan is discharge home, recommend the following: Assist for transportation;Assistance with cooking/housework;A little help with walking and/or transfers;A little help with bathing/dressing/bathroom;Help with stairs or ramp for entrance   Can travel by private vehicle        Equipment Recommendations  Rolling walker (2 wheels);BSC/3in1;Wheelchair (measurements PT)    Recommendations for Other Services       Precautions / Restrictions Precautions Precautions: Fall Recall of Precautions/Restrictions: Intact Restrictions Weight Bearing Restrictions Per Provider Order: Yes RLE Weight Bearing Per Provider Order: Non weight bearing Other Position/Activity Restrictions: RLE NWB with post op shoe donned     Mobility  Bed Mobility Overal bed mobility: Modified Independent Bed Mobility: Supine to Sit, Sit to Supine     Supine to sit: Modified independent (Device/Increase time), HOB elevated, Used rails     General bed mobility comments: safe technique with ease of transition    Transfers Overall transfer level: Needs assistance Equipment used: Rolling walker (2 wheels) Transfers: Sit to/from Stand Sit to Stand: Supervision   Step  pivot transfers: Contact guard assist       General transfer comment: follows commands well and able to transfer from low bed.    Ambulation/Gait Ambulation/Gait assistance: Contact guard assist, Supervision Gait Distance (Feet): 12 Feet Assistive device: Rolling walker (2 wheels) Gait Pattern/deviations: Step-to pattern       General Gait Details: able to hop and maintain correct WBing status while using RW. Pt fatigues very quickly.   Stairs             Merchant navy officer mobility: Yes Wheelchair propulsion: Both upper extremities Wheelchair parts: Supervision/cueing Distance: 200 Wheelchair Assistance Details (indicate cue type and reason): cues for locking WC and how to manuever around obstacles. Has difficulty with turns, however improved significantly with practice.   Tilt Bed    Modified Rankin (Stroke Patients Only)       Balance Overall balance assessment: Needs assistance Sitting-balance support: No upper extremity supported, Feet supported Sitting balance-Leahy Scale: Normal     Standing balance support: Bilateral upper extremity supported Standing balance-Leahy Scale: Fair                              Hotel manager: No apparent difficulties  Cognition Arousal: Alert Behavior During Therapy: WFL for tasks assessed/performed   PT - Cognitive impairments: No apparent impairments                       PT - Cognition Comments: pleasant and agreeable to session Following commands: Intact      Cueing Cueing Techniques: Verbal cues, Visual cues  Exercises      General  Comments        Pertinent Vitals/Pain Pain Assessment Pain Assessment: No/denies pain    Home Living                          Prior Function            PT Goals (current goals can now be found in the care plan section) Acute Rehab PT Goals Patient Stated Goal: to go home PT  Goal Formulation: With patient Time For Goal Achievement: 06/28/23 Potential to Achieve Goals: Good Progress towards PT goals: Progressing toward goals    Frequency    7X/week      PT Plan      Co-evaluation              AM-PAC PT "6 Clicks" Mobility   Outcome Measure  Help needed turning from your back to your side while in a flat bed without using bedrails?: None Help needed moving from lying on your back to sitting on the side of a flat bed without using bedrails?: None Help needed moving to and from a bed to a chair (including a wheelchair)?: A Little Help needed standing up from a chair using your arms (e.g., wheelchair or bedside chair)?: A Little Help needed to walk in hospital room?: A Little Help needed climbing 3-5 steps with a railing? : A Lot 6 Click Score: 19    End of Session Equipment Utilized During Treatment: Gait belt Activity Tolerance: Patient tolerated treatment well Patient left: in chair;with call bell/phone within reach;with chair alarm set Nurse Communication: Mobility status PT Visit Diagnosis: Other abnormalities of gait and mobility (R26.89);Muscle weakness (generalized) (M62.81)     Time: 1610-9604 PT Time Calculation (min) (ACUTE ONLY): 26 min  Charges:    $Gait Training: 8-22 mins $Wheel Chair Management: 8-22 mins PT General Charges $$ ACUTE PT VISIT: 1 Visit                     Amparo Balk, PT, DPT, GCS [phone]    Vanessia Bokhari 06/19/2023, 1:05 PM

## 2023-06-19 NOTE — TOC Transition Note (Signed)
 Transition of Care [facility]) - Discharge Note   Patient Details  Name: Scott Hale MRN: [id number] Date of Birth: March 29, 1950  [facility] CM/SW Contact:  Alexandra Ice, RN Phone Number: 06/19/2023, 3:40 PM   Clinical Narrative:     Patient to discharge today, home with home health services. [facility] accepted patient. Added to AVS. [employer] to deliver DME to deliver to bedside.   Final next level of care: Home w Home Health Services Barriers to Discharge: Barriers Resolved   Patient Goals and CMS Choice   CMS Medicare.gov Compare Post Acute Care list provided to:: Patient Choice offered to / list presented to : Patient      Discharge Placement                  Name of family member notified: Kristeen Peto Patient and family notified of of transfer: 06/19/23  Discharge Plan and Services Additional resources added to the After Visit Summary for                  DME Arranged: Walker rolling, Bedside commode, Lightweight manual wheelchair with seat cushion DME Agency: [facility] Date DME Agency Contacted: 06/19/23 Time DME Agency Contacted: 1539 Representative spoke with at DME Agency: Sam Creighton HH Arranged: PT, OT HH Agency: [facility] Date HH Agency Contacted: 06/19/23 Time HH Agency Contacted: 1540 Representative spoke with at [facility] Agency: Shaun  Social Drivers of Health (SDOH) Interventions SDOH Screenings   Food Insecurity: Food Insecurity Present (06/08/2023)  Housing: Low Risk  (06/08/2023)  Recent Concern: Housing - High Risk (05/27/2023)   Received [facility]  Transportation Needs: No Transportation Needs (06/08/2023)  Utilities: At Risk (06/08/2023)  Depression (PHQ2-9): Low Risk  (09/25/2021)  Financial Resource Strain: Medium Risk (12/05/2021)   Received from [facility], [facility]  Physical Activity: Sufficiently Active (03/26/2017)  Social Connections: Moderately  Isolated (06/08/2023)  Stress: No Stress Concern Present (03/26/2017)  Tobacco Use: Low Risk  (06/17/2023)     Readmission Risk Interventions     No data to display

## 2023-06-19 NOTE — Discharge Summary (Signed)
 Physician Discharge Summary   Patient: Scott Hale MRN: [id number] DOB: 1950/08/27  Admit date:     06/07/2023  Discharge date: 06/19/23  Discharge Physician: Brenna Cam   PCP: Lyle San, MD   Recommendations at discharge:    F/up with outpt providers as requested  Discharge Diagnoses: Principal Problem:   Cellulitis Active Problems:   Gangrene of toe of right foot (HCC)   PAD (peripheral artery disease) (HCC)   Cellulitis of right foot   Diabetic foot infection (HCC)   Gangrene of right foot [facility])  Hospital Course: Assessment and Plan:  73 y.o. male with medical history significant of DM2, HTN, CKD who presented with right foot pain.    Underwent transmetatarsal amputation of right foot and wound VAC application on 5/2   5/4: Bleeding noted from surgical site.  Hemoglobin relatively stable.  Vital signs remained stable.  Patient feels okay   5/5: S/p OR for wound washout and closure.  Discussed with podiatry.   RLE cellulitis  Right foot osteomyelitis  Rt foot gangrenous infection S/p TMA Postoperative bleeding --received vanc/cefe/flagyl  in the ED, then switched to Ancef .  No open wounds on presentation, but wounds opened up in between toes on 4/28.  ID and podiatry consulted.  Abx switched to abx to Unasyn  and Zyvox , per ID. --MRI right foot showed multiple areas of osteomyelitis -- Bleeding noted from surgical site 5/4 AM.   --Status post return to the OR 5/5 for wound washout and closure - Culture MSSA - On discharge per ID - he's switched over to p.o. Augmentin 875 mg twice daily for 2 weeks depending on how the wound is healing. May need longer course depending on clinical condition. Outpt providers visit to decide   - Progressing well post op no evidence necrosis or infection at amp site -XR: expected post op changes -WB Status: NWB in post op shoe to RLE -Sutures: remain intact 2-3 weeks. -2 weeks augmentin on DC -Foot redressed - leave this dsg intact  until follow up in 1 week with Podiatry office  - Return in 1 week to Podiatry/[facility]   PAD --ABI with Findings indicative of moderate right and mild left lower extremity peripheral arterial disease.  --Vascular surgery seen --S/p angio with stent placement x 2 Continue aspirin  and Plavix /DAPT at DC   Severe Sepsis Sepsis physiology resolved with IV antibiotics as above.   DM2 --recent A1c 7.6.     HTN -- Continue coreg    AKI on CKD 3a --Cr 1.85 on presentation, improved to 1.37 with IVF.   Hyponatremia --of unclear significance         Consultants: ID,  Vascular Surgery, Podiatry Procedures performed: transmetatarsal amputation of the right foot 06/14/23 & Wound VAC application 9 x 6 cm, right foot on 5/2 S/p angio with stent placement x 2 on 5/1 1.  Repeat irrigation and debridement of transmetatarsal amputation site with prep for graft application, right foot on 5/5 2.  Graft application 38 cm, right foot on 5/5 3.  Delayed primary closure 9 x 6 x 4 cm, right foot on 5/5 Disposition: Home health Diet recommendation:  Discharge Diet Orders (From admission, onward)     Start     Ordered   06/19/23 0000  Diet - low sodium heart healthy        06/19/23 1350           Carb modified diet DISCHARGE MEDICATION: Allergies as of 06/19/2023   No Known Allergies  Medication List     STOP taking these medications    spironolactone  25 MG tablet Commonly known as: ALDACTONE        TAKE these medications    acetaminophen  325 MG tablet Commonly known as: TYLENOL  Take 650 mg by mouth every 6 (six) hours as needed for mild pain, headache, fever or moderate pain.   amoxicillin-clavulanate 875-125 MG tablet Commonly known as: AUGMENTIN Take 1 tablet by mouth 2 (two) times daily for 14 days.   ASPIRIN  81 PO Take by mouth daily.   atorvastatin  40 MG tablet Commonly known as: LIPITOR TAKE 1 TABLET BY MOUTH EVERY DAY *NEW PRESCRIPTION REQUEST*    carvedilol  3.125 MG tablet Commonly known as: COREG  TAKE ONE (1) TABLET BY MOUTH TWICE DAILY *NEW PRESCRIPTION REQUEST*   cholecalciferol 25 MCG (1000 UNIT) tablet Commonly known as: VITAMIN D3 Take 1,000 Units by mouth daily.   clopidogrel  75 MG tablet Commonly known as: PLAVIX  Take 1 tablet (75 mg total) by mouth daily. Start taking on: Jun 20, 2023   cyanocobalamin 500 MCG tablet Commonly known as: VITAMIN B12 Take 500 mcg by mouth daily.   Dulaglutide 1.5 MG/0.5ML Soaj Inject 1.5 mg into the skin once a week. Mondays   Entresto  97-103 MG Generic drug: sacubitril -valsartan  Take 1 tablet by mouth 2 (two) times daily.   Farxiga  10 MG Tabs tablet Generic drug: dapagliflozin  propanediol TAKE 1 TABLET BY MOUTH EVERY DAY BEFORE BREAKFAST *NEW PRESCRIPTION REQUEST*   furosemide  40 MG tablet Commonly known as: LASIX  TAKE 1 TABLET BY MOUTH EVERY DAY *NEW PRESCRIPTION REQUEST*   glipiZIDE 10 MG tablet Commonly known as: GLUCOTROL Take 10 mg by mouth daily before breakfast.   Lantus  SoloStar 100 UNIT/ML Solostar Pen Generic drug: insulin  glargine Inject 45 Units into the skin at bedtime. What changed: how much to take   Niacin (Antihyperlipidemic) 500 MG Tabs Take 500 mg by mouth daily with breakfast.   tadalafil 5 MG tablet Commonly known as: CIALIS Take by mouth daily as needed for erectile dysfunction.               Durable Medical Equipment  (From admission, onward)           Start     Ordered   06/19/23 1345  For home use only DME wheelchair cushion (seat and back)  Once        06/19/23 1344   06/19/23 1344  For home use only DME 3 n 1  Once        06/19/23 1343   06/19/23 1344  For home use only DME Walker rolling  Once       Question Answer Comment  Walker: With 5 Inch Wheels   Patient needs a walker to treat with the following condition Ambulatory dysfunction      06/19/23 1343            Follow-up Information     Lyle San, MD.  Go on 07/22/2023.   Specialty: Family Medicine Why: Monday, June 9 at 1:30 p.m. [facility] Discharge F/UP Contact information: [address] [facility] [place] [address] [phone]         Evertt Hoe, DPM. Go on 06/26/2023.   Specialty: Podiatry Why: Dr. Michalene Agee, Wednesday, 5/14 at 8:15 a.m. **[address], [facility] Discharge F/UP Contact information: [address] [place] [address] [phone]  Discharge Exam: Filed Weights   06/13/23 1610 06/14/23 0739 06/17/23 0650  Weight: 108 kg 108 kg 108 kg   General exam: No acute distress Respiratory system: Lungs clear.  Normal work of breathing.  Room air Cardiovascular system: S1-S2, RRR, no murmurs, no pedal edema Gastrointestinal system: Soft, NT/ND, normal bowel Central nervous system: Alert and oriented. No focal neurological deficits. Extremities: Status post right TMA.  Wound dressed.  Dressings clean dry and intact Skin: see picture below Psychiatry: Judgement and insight appear normal. Mood & affect appropriate.     Condition at discharge: fair  The results of significant diagnostics from this hospitalization (including imaging, microbiology, ancillary and laboratory) are listed below for reference.   Imaging Studies: DG Abd 1 View Result Date: 06/17/2023 CLINICAL DATA:  Constipation EXAM: ABDOMEN - 1 VIEW COMPARISON:  None Available. FINDINGS: The bowel gas pattern is normal. No radio-opaque calculi or other significant radiographic abnormality are seen. 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 Electronically Signed   By: Fredrich Jefferson M.D.   On: 06/17/2023 16:39   DG Foot 2 Views Right Result Date:  06/17/2023 CLINICAL DATA:  Status post right foot surgery. EXAM: RIGHT FOOT - 2 VIEW COMPARISON:  Right foot radiographs 06/14/2023 FINDINGS: There is repeat amputation of the first through fifth rays, now to the proximal metatarsal shafts. Interval removal of the prior distal foot wound VAC. New distal postoperative fat surgical skin staples. No subcutaneous air. No definite cortical erosion. Mild dorsal talonavicular and navicular-cuneiform joint space narrowing and peripheral osteophytosis, unchanged. IMPRESSION: Interval revision amputation of the first through fifth rays, now to the proximal metatarsal shafts. No abnormal finding is seen. Electronically Signed   By: Bertina Broccoli M.D.   On: 06/17/2023 09:03   DG Foot 2 Views Right Result Date: 06/14/2023 CLINICAL DATA:  Most of radiograph. EXAM: RIGHT FOOT - 2 VIEW COMPARISON:  Right foot radiograph dated 06/07/2023. FINDINGS: Status post 1st-5th transmetatarsal amputation. There is no acute fracture or dislocation. The bones are well mineralized. Postsurgical changes of the soft tissues of the stump and a wound VAC. IMPRESSION: Transmetatarsal amputation. Electronically Signed   By: Angus Bark M.D.   On: 06/14/2023 13:52   PERIPHERAL VASCULAR CATHETERIZATION Result Date: 06/13/2023 See surgical note for result.  MR FOOT RIGHT W WO CONTRAST Result Date: 06/10/2023 CLINICAL DATA:  Right foot wound with concern for abscess and osteomyelitis. EXAM: MRI OF THE RIGHT FOREFOOT WITHOUT AND WITH CONTRAST TECHNIQUE: Multiplanar, multisequence MR imaging of the right foot was performed before and after the administration of intravenous contrast. CONTRAST:  10mL GADAVIST  GADOBUTROL  1 MMOL/ML IV SOLN COMPARISON:  Right foot radiographs dated 06/07/2023. FINDINGS: Bones/Joint/Cartilage/Soft tissue Diffuse subcutaneous edema of the midfoot and forefoot with enhancement dorsally. Cutaneous irregularity with soft tissue defect/wound at the dorsal third  intermetatarsal space, overlying the third and fourth proximal phalanges. This wound demonstrates peripheral T2 hyperintense signal with heterogenous intermediate to low signal centrally without significant associated enhancement, suggestive of wound with devitalized/necrotic tissue, and extends deep through the third intermetatarsal space (series 6, image 19) along the plantar subcutaneous tissues medially to the level of the base of second proximal phalanx and laterally to the level of the fifth MTP joint. There is marrow edema with associated T1 hypointensity of the base of the third and fourth proximal phalanges in the  heads of the third and fourth metatarsals at the level of the third and fourth MTP joints, compatible with osteomyelitis/septic arthritis. Marrow signal abnormality with enhancement at the head of fifth metatarsal, concerning for osteomyelitis. T2 hyperintense marrow edema with mild enhancement at the base of the second proximal phalanx could reflect reactive marrow changes, however, osteomyelitis can not be excluded. No acute fracture or dislocation. Mild degenerative changes of the first MTP joint. No significant joint effusion. Diffuse interphalangeal joint space narrowing. Ligaments Collateral ligaments are intact. Muscles and Tendons Flexor and extensor tendons are intact. No significant tenosynovitis. Diffuse edema with enhancement of the intrinsic musculature of the foot, concerning for myositis. IMPRESSION: 1. Soft tissue defect/wound at the dorsal third intermetatarsal space, overlying the third and fourth proximal phalanges, extends deep through the third intermetatarsal space and along the plantar subcutaneous tissues medially to the level of the base of second proximal phalanx and laterally to the level of the fifth MTP joint. This wound demonstrates peripheral T2 hyperintense signal with heterogenous intermediate to low signal centrally without significant associated enhancement,  suggestive of wound with devitalized/necrotic tissue. There is pronounced surrounding soft tissue edema and enhancement, compatible with cellulitis/myositis. 2. Marrow signal abnormality with enhancement of the base of the third and fourth proximal phalanges and the heads of the third and fourth metatarsals at the level of the third and fourth MTP joints, compatible with osteomyelitis/septic arthritis. Marrow signal abnormality with enhancement at the head of fifth metatarsal, concerning for osteomyelitis. 3. Marrow edema with mild enhancement at the base of the second proximal phalanx could reflect reactive marrow changes, however, osteomyelitis can not be excluded. Electronically Signed   By: Mannie Seek M.D.   On: 06/10/2023 17:53   US  ARTERIAL ABI (SCREENING LOWER EXTREMITY) Result Date: 06/10/2023 CLINICAL DATA:  Right foot ulcer. Hypertension. Hyperlipidemia. Diabetes. EXAM: NONINVASIVE PHYSIOLOGIC VASCULAR STUDY OF BILATERAL LOWER EXTREMITIES TECHNIQUE: Evaluation of both lower extremities were performed at rest, including calculation of ankle-brachial indices with single level pressure measurements and doppler recording. COMPARISON:  None available. FINDINGS: Right ABI:  0.58 Left ABI:  0.86 Right Lower Extremity: Posterior tibial and dorsalis pedis artery waveforms are monophasic. Left Lower Extremity: Posterior tibial and dorsalis pedis artery waveforms are monophasic. 0.5-0.79 Moderate PAD IMPRESSION: Findings indicative of moderate right and mild left lower extremity peripheral arterial disease. Electronically Signed   By: Elester Grim M.D.   On: 06/10/2023 17:24   DG Foot Complete Right Result Date: 06/07/2023 CLINICAL DATA:  Swelling and redness on top of foot. EXAM: RIGHT FOOT COMPLETE - 3+ VIEW COMPARISON:  Mild-to-moderate hallux valgus. FINDINGS: Mild great toe interphalangeal joint space narrowing. Mild dorsal navicular degenerative osteophytosis extending to the talonavicular and  navicular-cuneiform articulations. No acute fracture or dislocation. No cortical erosion. No subcutaneous air. IMPRESSION: 1. Mild great toe interphalangeal joint osteoarthritis. 2. Mild midfoot osteoarthritis. 3. No cortical erosion or subcutaneous air. Electronically Signed   By: Bertina Broccoli M.D.   On: 06/07/2023 16:10    Microbiology: Results for orders placed or performed during the hospital encounter of 06/07/23  Resp panel by RT-PCR (RSV, Flu A&B, Covid) Anterior Nasal Swab     Status: None   Collection Time: 06/07/23  4:53 PM   Specimen: Anterior Nasal Swab  Result Value Ref Range Status   SARS Coronavirus 2 by RT PCR NEGATIVE NEGATIVE Final    Comment: (NOTE) SARS-CoV-2 target nucleic acids are NOT DETECTED.  The SARS-CoV-2 RNA is generally detectable in upper respiratory specimens during  the acute phase of infection. The lowest concentration of SARS-CoV-2 viral copies this assay can detect is 138 copies/mL. A negative result does not preclude SARS-Cov-2 infection and should not be used as the sole basis for treatment or other patient management decisions. A negative result may occur with  improper specimen collection/handling, submission of specimen other than nasopharyngeal swab, presence of viral mutation(s) within the areas targeted by this assay, and inadequate number of viral copies(<138 copies/mL). A negative result must be combined with clinical observations, patient history, and epidemiological information. The expected result is Negative.  Fact Sheet for Patients:  BloggerCourse.com  Fact Sheet for Healthcare Providers:  SeriousBroker.it  This test is no t yet approved or cleared by the [place]  FDA and  has been authorized for detection and/or diagnosis of SARS-CoV-2 by FDA under an Emergency Use Authorization (EUA). This EUA will remain  in effect (meaning this test can be used) for the duration of  the COVID-19 declaration under Section 564(b)(1) of the Act, 21 U.S.C.section 360bbb-3(b)(1), unless the authorization is terminated  or revoked sooner.       Influenza A by PCR NEGATIVE NEGATIVE Final   Influenza B by PCR NEGATIVE NEGATIVE Final    Comment: (NOTE) The Xpert Xpress SARS-CoV-2/FLU/RSV plus assay is intended as an aid in the diagnosis of influenza from Nasopharyngeal swab specimens and should not be used as a sole basis for treatment. Nasal washings and aspirates are unacceptable for Xpert Xpress SARS-CoV-2/FLU/RSV testing.  Fact Sheet for Patients: BloggerCourse.com  Fact Sheet for Healthcare Providers: SeriousBroker.it  This test is not yet approved or cleared by the [place]  FDA and has been authorized for detection and/or diagnosis of SARS-CoV-2 by FDA under an Emergency Use Authorization (EUA). This EUA will remain in effect (meaning this test can be used) for the duration of the COVID-19 declaration under Section 564(b)(1) of the Act, 21 U.S.C. section 360bbb-3(b)(1), unless the authorization is terminated or revoked.     Resp Syncytial Virus by PCR NEGATIVE NEGATIVE Final    Comment: (NOTE) Fact Sheet for Patients: BloggerCourse.com  Fact Sheet for Healthcare Providers: SeriousBroker.it  This test is not yet approved or cleared by the [place]  FDA and has been authorized for detection and/or diagnosis of SARS-CoV-2 by FDA under an Emergency Use Authorization (EUA). This EUA will remain in effect (meaning this test can be used) for the duration of the COVID-19 declaration under Section 564(b)(1) of the Act, 21 U.S.C. section 360bbb-3(b)(1), unless the authorization is terminated or revoked.  Performed at [facility], [address] [place], [place] [address]   Culture, blood (routine x 2)     Status: None   Collection Time:  06/07/23  5:25 PM   Specimen: BLOOD  Result Value Ref Range Status   Specimen Description BLOOD RIGHT ANTECUBITAL  Final   Special Requests   Final    BOTTLES DRAWN AEROBIC AND ANAEROBIC Blood Culture results may not be optimal due to an inadequate volume of blood received in culture bottles   Culture   Final    NO GROWTH 5 DAYS Performed at [facility], [address] [place], [place] [address]    Report Status 06/12/2023 FINAL  Final  Culture, blood (routine x 2)     Status: None   Collection Time: 06/07/23  5:27 PM   Specimen: BLOOD  Result Value Ref Range Status   Specimen Description BLOOD LEFT ANTECUBITAL  Final   Special Requests   Final  BOTTLES DRAWN AEROBIC AND ANAEROBIC Blood Culture results may not be optimal due to an inadequate volume of blood received in culture bottles   Culture   Final    NO GROWTH 5 DAYS Performed at [facility], [address] [place], [place] [address]    Report Status 06/12/2023 FINAL  Final  Aerobic Culture w Gram Stain (superficial specimen)     Status: None   Collection Time: 06/10/23  7:35 PM   Specimen: Wound  Result Value Ref Range Status   Specimen Description   Final    WOUND Performed at [facility], [address], [place], [place] [address]    Special Requests   Final    RIGHT FOOT Performed at [facility], [address] [place], [place] [address]    Gram Stain NO WBC SEEN FEW GRAM POSITIVE COCCI IN PAIRS   Final   Culture   Final    RARE GROUP B STREP(S.AGALACTIAE)ISOLATED TESTING AGAINST S. AGALACTIAE NOT ROUTINELY PERFORMED DUE TO PREDICTABILITY OF AMP/PEN/VAN SUSCEPTIBILITY. WITHIN MIXED NORMAL SKIN FLORA Performed at [facility], [address], [place], [place] [address]    Report Status 06/13/2023 FINAL  Final  Aerobic/Anaerobic Culture w Gram Stain (surgical/deep wound)     Status: None   Collection Time: 06/14/23  8:45 AM   Specimen: Foot, Right; Amputation   Result Value Ref Range Status   Specimen Description   Final    TISSUE Performed at [facility], [address] [place], [place] [address]    Special Requests   Final    RIGHT FOOT TISSUE CULTURE Performed at [facility], [address] [place], [place] [address]    Gram Stain   Final    FEW WBC PRESENT, PREDOMINANTLY PMN NO ORGANISMS SEEN    Culture   Final    RARE STAPHYLOCOCCUS AUREUS NO ANAEROBES ISOLATED Performed at [facility], [address] [place], [place] [address]    Report Status 06/19/2023 FINAL  Final   Organism ID, Bacteria STAPHYLOCOCCUS AUREUS  Final      Susceptibility   Staphylococcus aureus - MIC*    CIPROFLOXACIN <=0.5 SENSITIVE Sensitive     ERYTHROMYCIN <=0.25 SENSITIVE Sensitive     GENTAMICIN <=0.5 SENSITIVE Sensitive     OXACILLIN 0.5 SENSITIVE Sensitive     TETRACYCLINE <=1 SENSITIVE Sensitive     VANCOMYCIN  1 SENSITIVE Sensitive     TRIMETH /SULFA  <=10 SENSITIVE Sensitive     CLINDAMYCIN <=0.25 SENSITIVE Sensitive     RIFAMPIN <=0.5 SENSITIVE Sensitive     Inducible Clindamycin NEGATIVE Sensitive     LINEZOLID  2 SENSITIVE Sensitive     * RARE STAPHYLOCOCCUS AUREUS  Aerobic/Anaerobic Culture w Gram Stain (surgical/deep wound)     Status: None   Collection Time: 06/14/23  8:52 AM   Specimen: Foot, Right; Blood  Result Value Ref Range Status   Specimen Description   Final    FOOT Performed at [facility], [address] [place], [place] [address]    Special Requests RIGHT FOOT  Final   Gram Stain NO WBC SEEN NO ORGANISMS SEEN   Final   Culture   Final    RARE STAPHYLOCOCCUS AUREUS SUSCEPTIBILITIES PERFORMED ON PREVIOUS CULTURE WITHIN THE LAST 5 DAYS. NO ANAEROBES ISOLATED Performed at [facility], [address], [place], [place] [address]    Report Status 06/19/2023 FINAL  Final    Labs: CBC: Recent Labs  Lab  06/15/23 0410 06/16/23 0241 06/16/23 1216 06/16/23 2035  06/17/23 0323 06/18/23 0826 06/19/23 0333  WBC 17.5* 14.0*  --   --  10.3 13.5* 12.9*  NEUTROABS  --   --   --   --   --  10.1* 9.1*  HGB 10.5* 10.2* 10.6* 10.6* 11.1* 10.7* 9.8*  HCT 32.1* 30.8* 32.1* 32.4* 33.9* 32.5* 30.1*  MCV 91.2 91.1  --   --  92.4 91.5 92.6  PLT 349 353  --   --  418* 396 417*   Basic Metabolic Panel: Recent Labs  Lab 06/14/23 2138 06/18/23 0826 06/19/23 0333  NA  --  129* 131*  K  --  4.4 4.6  CL  --  96* 98  CO2  --  24 25  GLUCOSE 442* 156* 123*  BUN  --  15 17  CREATININE  --  1.36* 1.29*  CALCIUM   --  8.6* 8.7*   Liver Function Tests: No results for input(s): "AST", "ALT", "ALKPHOS", "BILITOT", "PROT", "ALBUMIN" in the last 168 hours. CBG: Recent Labs  Lab 06/18/23 1156 06/18/23 1640 06/18/23 2116 06/19/23 0852 06/19/23 1219  GLUCAP 233* 138* 139* 229* 159*    Discharge time spent: greater than 30 minutes.  Signed: Brenna Cam, MD [facility] 06/19/2023

## 2023-06-19 NOTE — Progress Notes (Signed)
 Date of Admission:  06/07/2023     ID: Larnell Sitzman is a 73 y.o. 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 The surgical note says there was significant purulence, infection and a wound VAC was placed Pt underwent repeat I/D and graft application 06/17/23   Patient is feeling fine  Medications:   aspirin  EC  81 mg Oral Daily   atorvastatin   40 mg Oral Daily   carvedilol   3.125 mg Oral BID WC   clopidogrel   75 mg Oral Daily   enoxaparin  (LOVENOX ) injection  0.5 mg/kg Subcutaneous Q24H   furosemide   40 mg Oral Daily   insulin  aspart  0-9 Units Subcutaneous TID WC   insulin  glargine-yfgn  45 Units Subcutaneous QHS   metroNIDAZOLE   500 mg Oral Q12H   sacubitril -valsartan   1 tablet Oral BID   senna-docusate  1 tablet Oral BID   spironolactone   25 mg Oral Daily    Objective: Vital signs in last 24 hours: Patient Vitals for the past 24 hrs:  BP Temp Temp src Pulse Resp SpO2  06/19/23 1556 (!) 111/59 99.3 F (37.4 C) Oral 71 16 99 %  06/19/23 0746 117/70 98.6 F (37 C) -- 60 16 99 %  06/19/23 0222 123/71 98.1 F (36.7 C) -- 63 18 98 %  06/18/23 1958 106/68 99 F (37.2 C) -- 67 17 99 %       PHYSICAL EXAM:  General: Alert, cooperative, no distress, appears stated age.      TMA site looks good Lab Results    Latest Ref Rng & Units 06/19/2023    3:33 AM 06/18/2023    8:26 AM 06/17/2023    3:23 AM  CBC  WBC 4.0 - 10.5 K/uL 12.9  13.5  10.3   Hemoglobin 13.0 - 17.0 g/dL 9.8  29.5  62.1   Hematocrit 39.0 - 52.0 % 30.1  32.5  33.9   Platelets 150 - 400 K/uL 417  396  418        Latest Ref Rng & Units 06/19/2023    3:33 AM 06/18/2023    8:26 AM 06/14/2023    9:38 PM  CMP  Glucose 70 - 99 mg/dL 308  657  846   BUN 8 - 23  mg/dL 17  15    Creatinine 9.62 - 1.24 mg/dL 9.52  8.41    Sodium 324 - 145 mmol/L 131  129    Potassium 3.5 - 5.1 mmol/L 4.6  4.4    Chloride 98 - 111 mmol/L 98  96    CO2 22 - 32 mmol/L 25  24    Calcium  8.9 - 10.3 mg/dL 8.7  8.6            Assessment/Plan: 73 yr male with DM presents with pain rt foot of 2 weeks duration   Rt foot gangrenous infection S/p TMA Culture MSSA On cefazolin  and flagyl  Path clear margin On discharge is  switched over to p.o. Augmentin 875 mg twice daily for 2 -4 weeks depending on how the wound is healing.   Anemia  PAD- underwent angio and had angioplasty and stent placement of rt ATA, SFA, popliteal  DM- no recent A1c   Htn  CAD s/p CABG   RT TKA) no evidence of PJI ? Discussed the management with the patient and partner and care team Will follow as OP

## 2023-06-25 ENCOUNTER — Emergency Department

## 2023-06-25 ENCOUNTER — Emergency Department
Admission: EM | Admit: 2023-06-25 | Discharge: 2023-06-25 | Disposition: A | Discharge status: Home / Self Care | Attending: Emergency Medicine | Admitting: Emergency Medicine

## 2023-06-25 ENCOUNTER — Other Ambulatory Visit: Payer: Self-pay

## 2023-06-25 ENCOUNTER — Encounter: Payer: Self-pay | Admitting: Intensive Care

## 2023-06-25 DIAGNOSIS — R55 Syncope and collapse: Secondary | ICD-10-CM | POA: Diagnosis present

## 2023-06-25 DIAGNOSIS — I2510 Atherosclerotic heart disease of native coronary artery without angina pectoris: Secondary | ICD-10-CM | POA: Insufficient documentation

## 2023-06-25 DIAGNOSIS — N189 Chronic kidney disease, unspecified: Secondary | ICD-10-CM | POA: Insufficient documentation

## 2023-06-25 DIAGNOSIS — I129 Hypertensive chronic kidney disease with stage 1 through stage 4 chronic kidney disease, or unspecified chronic kidney disease: Secondary | ICD-10-CM | POA: Insufficient documentation

## 2023-06-25 DIAGNOSIS — E1122 Type 2 diabetes mellitus with diabetic chronic kidney disease: Secondary | ICD-10-CM | POA: Diagnosis not present

## 2023-06-25 LAB — CBC
HCT: 34 % — ABNORMAL LOW (ref 39.0–52.0)
Hemoglobin: 11.1 g/dL — ABNORMAL LOW (ref 13.0–17.0)
MCH: 30.1 pg (ref 26.0–34.0)
MCHC: 32.6 g/dL (ref 30.0–36.0)
MCV: 92.1 fL (ref 80.0–100.0)
Platelets: 431 10*3/uL — ABNORMAL HIGH (ref 150–400)
RBC: 3.69 MIL/uL — ABNORMAL LOW (ref 4.22–5.81)
RDW: 13.6 % (ref 11.5–15.5)
WBC: 9.4 10*3/uL (ref 4.0–10.5)
nRBC: 0 % (ref 0.0–0.2)

## 2023-06-25 LAB — COMPREHENSIVE METABOLIC PANEL WITH GFR
ALT: 40 U/L (ref 0–44)
AST: 25 U/L (ref 15–41)
Albumin: 2.8 g/dL — ABNORMAL LOW (ref 3.5–5.0)
Alkaline Phosphatase: 73 U/L (ref 38–126)
Anion gap: 10 (ref 5–15)
BUN: 16 mg/dL (ref 8–23)
CO2: 26 mmol/L (ref 22–32)
Calcium: 9.4 mg/dL (ref 8.9–10.3)
Chloride: 100 mmol/L (ref 98–111)
Creatinine, Ser: 1.4 mg/dL — ABNORMAL HIGH (ref 0.61–1.24)
GFR, Estimated: 54 mL/min — ABNORMAL LOW
Glucose, Bld: 96 mg/dL (ref 70–99)
Potassium: 4.4 mmol/L (ref 3.5–5.1)
Sodium: 136 mmol/L (ref 135–145)
Total Bilirubin: 0.4 mg/dL (ref 0.0–1.2)
Total Protein: 8.6 g/dL — ABNORMAL HIGH (ref 6.5–8.1)

## 2023-06-25 LAB — TROPONIN I (HIGH SENSITIVITY)
Troponin I (High Sensitivity): 6 ng/L (ref <18)
Troponin I (High Sensitivity): 6 ng/L (ref <18)

## 2023-06-25 LAB — D-DIMER, QUANTITATIVE: D-Dimer, Quant: 1.65 ug{FEU}/mL — ABNORMAL HIGH (ref 0.00–0.50)

## 2023-06-25 LAB — CBG MONITORING, ED: Glucose-Capillary: 79 mg/dL (ref 70–99)

## 2023-06-25 LAB — LACTIC ACID, PLASMA: Lactic Acid, Venous: 0.9 mmol/L (ref 0.5–1.9)

## 2023-06-25 MED ORDER — SODIUM CHLORIDE 0.9 % IV BOLUS
500.0000 mL | Freq: Once | INTRAVENOUS | Status: AC
  Administered 2023-06-25: 500 mL via INTRAVENOUS

## 2023-06-25 MED ORDER — IOHEXOL 350 MG/ML SOLN
100.0000 mL | Freq: Once | INTRAVENOUS | Status: AC | PRN
  Administered 2023-06-25: 100 mL via INTRAVENOUS

## 2023-06-25 NOTE — ED Provider Notes (Signed)
 [facility] Provider Note    Event Date/Time   First MD Initiated Contact with Patient 06/25/23 1456     (approximate)   History   Loss of Consciousness   HPI  Scott Hale is a 73 year old male with history of T2DM, HTN, CKD, CAD presenting to the emergency department following a syncopal episode.  Patient was working with physical therapy at his house which he reports was more exertion than he had been doing over the past few days.  He began to feel warm and sat on the bench in the shower where he passed out.  Denies any head trauma.  Regained consciousness after 1 to 2 minutes.  Currently feels back at baseline.  Denies any chest pain or shortness of breath.  I did review his discharge summary from 06/19/2023.  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      Physical Exam   Triage Vital Signs: ED Triage Vitals  Encounter Vitals Group     BP 06/25/23 1432 102/70     Systolic BP Percentile --      Diastolic BP Percentile --      Pulse Rate 06/25/23 1432 66     Resp 06/25/23 1432 15     Temp 06/25/23 1455 98.7 F (37.1 C)     Temp Source 06/25/23 1455 Oral     SpO2 06/25/23 1432 97 %     Weight 06/25/23 1433 230 lb (104.3 kg)     Height 06/25/23 1433 6' (1.829 m)     Head Circumference --      Peak Flow --      Pain Score 06/25/23 1432 0     Pain Loc --      Pain Education --      Exclude from Growth Chart --     Most recent vital signs: Vitals:   06/25/23 1805 06/25/23 1810  BP:    Pulse:  68  Resp: 13 (!) 9  Temp:    SpO2:  100%     General: Awake, interactive  CV:  Regular rate, good peripheral perfusion.  Resp:  Unlabored respirations, lungs clear to auscultation Abd:  Nondistended, soft Neuro:  Symmetric facial movement, fluid speech, moving extremity spontaneously and equally MSK:  The right distal extremity  is wrapped and patient was told to keep dressing in place until his follow-up visit tomorrow.  No drainage noted on dressing.  No surrounding erythema, warmth.   ED Results / Procedures / Treatments   Labs (all labs ordered are listed, but only abnormal results are displayed) Labs Reviewed  COMPREHENSIVE METABOLIC PANEL WITH GFR - Abnormal; Notable for the following components:      Result Value   Creatinine, Ser 1.40 (*)    Total Protein 8.6 (*)    Albumin 2.8 (*)    GFR, Estimated 54 (*)    All other components within normal limits  CBC - Abnormal; Notable for the following components:   RBC 3.69 (*)    Hemoglobin 11.1 (*)    HCT 34.0 (*)    Platelets 431 (*)    All other components within normal limits  D-DIMER, QUANTITATIVE - Abnormal; Notable for the following components:   D-Dimer, Quant 1.65 (*)    All other components within normal limits  LACTIC ACID, PLASMA  CBG MONITORING, ED  TROPONIN I (HIGH SENSITIVITY)  TROPONIN I (HIGH  SENSITIVITY)     EKG EKG independently reviewed interpreted by myself (ER attending) demonstrates:  EKG demonstrates normal sinus rhythm rate of 73, PR 176, QRS 84, QTc 420, no acute ST changes  RADIOLOGY Imaging independently reviewed and interpreted by myself demonstrates:  CTA of the chest without evidence of PE  Formal Radiology Read:  CT Angio Chest PE W and/or Wo Contrast Result Date: 06/25/2023 CLINICAL DATA:  Positive D-dimer, syncope, recent right foot amputation EXAM: CT ANGIOGRAPHY CHEST WITH CONTRAST TECHNIQUE: Multidetector CT imaging of the chest was performed using the standard protocol during bolus administration of intravenous contrast. Multiplanar CT image reconstructions and MIPs were obtained to evaluate the vascular anatomy. RADIATION DOSE REDUCTION: This exam was performed according to the departmental dose-optimization program which includes automated exposure control, adjustment of the mA and/or kV according to patient  size and/or use of iterative reconstruction technique. CONTRAST:  100mL OMNIPAQUE IOHEXOL 350 MG/ML SOLN COMPARISON:  None Available. FINDINGS: Cardiovascular: This is a technically adequate evaluation of the pulmonary vasculature. No filling defects or pulmonary emboli. The heart is unremarkable without pericardial effusion. Postsurgical changes from prior CABG. Extensive atherosclerosis of the native coronary vasculature. Normal caliber of the thoracic aorta. Diffuse aortic atherosclerosis. Evaluation of the aortic lumen is limited due to timing of the contrast bolus. Mediastinum/Nodes: No enlarged mediastinal, hilar, or axillary lymph nodes. Thyroid gland, trachea, and esophagus demonstrate no significant findings. Lungs/Pleura: No acute airspace disease, effusion, or pneumothorax. The central airways are patent. Upper Abdomen: No acute abnormality. Musculoskeletal: No acute or destructive bony abnormalities. Reconstructed images demonstrate no additional findings. Review of the MIP images confirms the above findings. IMPRESSION: 1. No evidence of pulmonary embolus. 2. No acute intrathoracic process. 3. Aortic Atherosclerosis (ICD10-I70.0). Coronary artery atherosclerosis. Electronically Signed   By: Bobbye Burrow M.D.   On: 06/25/2023 18:24    PROCEDURES:  Critical Care performed: No  Procedures   MEDICATIONS ORDERED IN ED: Medications  sodium chloride  0.9 % bolus 500 mL (0 mLs Intravenous Stopped 06/25/23 1632)  iohexol (OMNIPAQUE) 350 MG/ML injection 100 mL (100 mLs Intravenous Contrast Given 06/25/23 1757)     IMPRESSION / MDM / ASSESSMENT AND PLAN / ED COURSE  I reviewed the triage vital signs and the nursing notes.  Differential diagnosis includes, but is not limited to, vasovagal syncope in the setting of repeated positional changes, anemia, electrolyte abnormality, ACS, PE, arrhythmia  Patient's presentation is most consistent with acute presentation with potential threat to life or  bodily function.  73 year old male presenting following a syncopal episode.  Stable vitals on presentation.  Labs sent from triage with normal white blood cell count, stable anemia, stable renal dysfunction.  Normal lactate.  With cardiac history and recent surgery will add on troponin and D-dimer.  Will also give IV fluids.  Negative troponin x 2.  D-dimer elevated as below but CTA fortunately without evidence of PE.  Patient feels improved on reevaluation.  He is eager to be discharged home which I do think is reasonable given his overall clinical presentation.  Suspect likely vasovagal syncope.  Has follow-up with his surgeon tomorrow.  Strict return precautions provided.  Patient discharged in stable condition.  Clinical Course as of 06/25/23 1920  Tue Jun 25, 2023  1725 D-dimer, quantitative(!) D-dimer elevated.  CTA ordered to further evaluate. [NR]    Clinical Course User Index [NR] Claria Crofts, MD     FINAL CLINICAL IMPRESSION(S) / ED DIAGNOSES   Final diagnoses:  Syncope and collapse  Rx / DC Orders   ED Discharge Orders     None        Note:  This document was prepared using Dragon voice recognition software and may include unintentional dictation errors.   Claria Crofts, MD 06/25/23 [phone]

## 2023-06-25 NOTE — ED Triage Notes (Signed)
 Patient arrived by EMS from home after syncopal episode. Recently discharged from [facility] after amputation of right foot, toes.  PT was at patients house and assisting him in learning how to do ADLs and patient reported he stood to long and fainted.

## 2023-06-25 NOTE — ED Notes (Signed)
 To CT via stretcher, alert, NAD, calm, interactive, resps e/u, speaking in clear complete sentences.

## 2023-06-25 NOTE — ED Notes (Signed)
 Back from CTA, urinal at [facility]. VSS. No changes. Alert, NAD, calm, interactive.

## 2023-06-25 NOTE — Discharge Instructions (Signed)
 You were seen in the emergency department after passing out (syncope).  Your testing fortunately did not show an emergency cause for this.  Keep your scheduled follow-up with your surgeon.Return to the ER immediately if you develop chest pain, shortness of breath, it feels like your heart is racing, repeated episodes, or other new or concerning symptoms.

## 2023-06-25 NOTE — ED Notes (Signed)
 Alert, NAD, calm, interactive, resps e/u, speaking in clear complete sentences. Denies pain, sob, nausea or dizziness. RLE wrapped with ACE wrap, CDI.

## 2023-06-25 NOTE — ED Notes (Signed)
2nd blue top sent to lab

## 2023-06-26 ENCOUNTER — Telehealth: Payer: Self-pay | Admitting: Podiatry

## 2023-06-26 ENCOUNTER — Ambulatory Visit (INDEPENDENT_AMBULATORY_CARE_PROVIDER_SITE_OTHER): Admitting: Podiatry

## 2023-06-26 ENCOUNTER — Encounter: Payer: Self-pay | Admitting: Podiatry

## 2023-06-26 VITALS — Ht 72.0 in | Wt 230.0 lb

## 2023-06-26 DIAGNOSIS — Z794 Long term (current) use of insulin: Secondary | ICD-10-CM

## 2023-06-26 DIAGNOSIS — E1152 Type 2 diabetes mellitus with diabetic peripheral angiopathy with gangrene: Secondary | ICD-10-CM

## 2023-06-26 DIAGNOSIS — B351 Tinea unguium: Secondary | ICD-10-CM | POA: Diagnosis not present

## 2023-06-26 DIAGNOSIS — T8131XA Disruption of external operation (surgical) wound, not elsewhere classified, initial encounter: Secondary | ICD-10-CM

## 2023-06-26 NOTE — Telephone Encounter (Signed)
 Scott Hale, OT with [facility] called to let you know that when this patient was in therapy yesterday he lost conciousness.  911 was called, sent to the hospital.  He was checked and was cleared.  The wife said that this has happened before.  He was checked for blood clots and was ok.

## 2023-06-26 NOTE — Progress Notes (Signed)
  Subjective:  Patient ID: Scott Hale, male    DOB: Oct 28, 1950,  MRN: [id number]  Chief Complaint  Patient presents with   Routine Post Op    POV #1 DOS: 06/14/23: Transmetatarsal amputation, right foot Some fresh blood on bandaging, no odor over all looks ok  Nail trim left foot   73 y.o. male returns for post-op check.   Review of Systems: Negative except as noted in the HPI. Denies N/V/F/Ch.   Objective:  There were no vitals filed for this visit. Body mass index is 31.19 kg/m. Constitutional Well developed. Well nourished.  Vascular Foot warm and well perfused. Capillary refill normal to all digits.  Calf is soft and supple, no posterior calf or knee pain, negative Homans' sign  Neurologic Normal speech. Oriented to person, place, and time. Epicritic sensation to light touch grossly reduced bilaterally.  Dermatologic Delayed healing of plantar flap with ecchymosis and blistering.  Superficial dehiscence at incision site laterally.  No cellulitis purulence or malodor.  No ascending cellulitis.  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   Assessment:   1. Dehiscence of operative wound, initial encounter    Plan:  Patient was evaluated and treated and all questions answered.  S/p foot surgery right - Unfortunately appears to have delayed wound healing.  Staples and sutures left intact and we will plan to remove in 2 weeks and reevaluate hopefully can salvage with local wound care, will be difficult with his diabetes and PAD.  Until then continue nonweightbearing, complete antibiotic course, recommended changing the dressing every 2 to 3 days and referral for home health care nurse will be sent for this.  Betadine applied today with dry sterile dressings.  No compression.  Nails thickened elongated and mycotic.  Professional debridement required to due to his high risk of infection and  wound complications. Recommended debridement of the nails today. Sharp and mechanical debridement performed of all painful and mycotic nails today. Nails debrided in length and thickness using a nail nipper to level of comfort.  His significant other inquired about removal of the nails, I discussed with her that there is too much of a risk of infection or nonhealing with proceeding with this at this time      Return in 2 weeks (on 07/10/2023) for post op (no x-rays), suture removal.

## 2023-06-28 ENCOUNTER — Telehealth: Payer: Self-pay | Admitting: Podiatry

## 2023-06-28 NOTE — Telephone Encounter (Signed)
 Needs verbal orders to con't PT  2X's 3 week 1X's 2 week

## 2023-06-28 NOTE — Telephone Encounter (Signed)
 Scott Hale, PT Therapist LVM of Dr. Charleston Conrad reply

## 2023-07-01 ENCOUNTER — Other Ambulatory Visit: Payer: Self-pay

## 2023-07-01 ENCOUNTER — Emergency Department
Admission: EM | Admit: 2023-07-01 | Discharge: 2023-07-01 | Disposition: A | Discharge status: Home / Self Care | Attending: Emergency Medicine | Admitting: Emergency Medicine

## 2023-07-01 ENCOUNTER — Telehealth: Payer: Self-pay | Admitting: Podiatry

## 2023-07-01 ENCOUNTER — Emergency Department

## 2023-07-01 ENCOUNTER — Encounter: Payer: Self-pay | Admitting: Podiatry

## 2023-07-01 DIAGNOSIS — Z89431 Acquired absence of right foot: Secondary | ICD-10-CM

## 2023-07-01 DIAGNOSIS — S98921D Partial traumatic amputation of right foot, level unspecified, subsequent encounter: Secondary | ICD-10-CM | POA: Insufficient documentation

## 2023-07-01 DIAGNOSIS — I110 Hypertensive heart disease with heart failure: Secondary | ICD-10-CM | POA: Diagnosis not present

## 2023-07-01 DIAGNOSIS — E119 Type 2 diabetes mellitus without complications: Secondary | ICD-10-CM | POA: Insufficient documentation

## 2023-07-01 DIAGNOSIS — Z4800 Encounter for change or removal of nonsurgical wound dressing: Secondary | ICD-10-CM | POA: Insufficient documentation

## 2023-07-01 DIAGNOSIS — X58XXXD Exposure to other specified factors, subsequent encounter: Secondary | ICD-10-CM | POA: Insufficient documentation

## 2023-07-01 DIAGNOSIS — I509 Heart failure, unspecified: Secondary | ICD-10-CM | POA: Insufficient documentation

## 2023-07-01 DIAGNOSIS — S99921D Unspecified injury of right foot, subsequent encounter: Secondary | ICD-10-CM | POA: Diagnosis present

## 2023-07-01 DIAGNOSIS — Z5189 Encounter for other specified aftercare: Secondary | ICD-10-CM

## 2023-07-01 LAB — CBC WITH DIFFERENTIAL/PLATELET
Abs Immature Granulocytes: 0.02 10*3/uL (ref 0.00–0.07)
Basophils Absolute: 0.1 10*3/uL (ref 0.0–0.1)
Basophils Relative: 1 %
Eosinophils Absolute: 0.3 10*3/uL (ref 0.0–0.5)
Eosinophils Relative: 4 %
HCT: 35.8 % — ABNORMAL LOW (ref 39.0–52.0)
Hemoglobin: 11.4 g/dL — ABNORMAL LOW (ref 13.0–17.0)
Immature Granulocytes: 0 %
Lymphocytes Relative: 34 %
Lymphs Abs: 2.4 10*3/uL (ref 0.7–4.0)
MCH: 29.8 pg (ref 26.0–34.0)
MCHC: 31.8 g/dL (ref 30.0–36.0)
MCV: 93.5 fL (ref 80.0–100.0)
Monocytes Absolute: 0.6 10*3/uL (ref 0.1–1.0)
Monocytes Relative: 9 %
Neutro Abs: 3.6 10*3/uL (ref 1.7–7.7)
Neutrophils Relative %: 52 %
Platelets: 332 10*3/uL (ref 150–400)
RBC: 3.83 MIL/uL — ABNORMAL LOW (ref 4.22–5.81)
RDW: 14 % (ref 11.5–15.5)
WBC: 7 10*3/uL (ref 4.0–10.5)
nRBC: 0 % (ref 0.0–0.2)

## 2023-07-01 LAB — BASIC METABOLIC PANEL WITH GFR
Anion gap: 10 (ref 5–15)
BUN: 23 mg/dL (ref 8–23)
CO2: 25 mmol/L (ref 22–32)
Calcium: 9 mg/dL (ref 8.9–10.3)
Chloride: 99 mmol/L (ref 98–111)
Creatinine, Ser: 1.42 mg/dL — ABNORMAL HIGH (ref 0.61–1.24)
GFR, Estimated: 53 mL/min — ABNORMAL LOW (ref >60)
Glucose, Bld: 233 mg/dL — ABNORMAL HIGH (ref 70–99)
Potassium: 4.3 mmol/L (ref 3.5–5.1)
Sodium: 134 mmol/L — ABNORMAL LOW (ref 135–145)

## 2023-07-01 LAB — LACTIC ACID, PLASMA: Lactic Acid, Venous: 1.6 mmol/L (ref 0.5–1.9)

## 2023-07-01 NOTE — ED Provider Notes (Signed)
 [facility] Provider Note   Event Date/Time   First MD Initiated Contact with Patient 07/01/23 2052     (approximate) History  Wound Check  HPI Scott Hale is a 73 y.o. male with a past medical history of hypertension, CHF, diabetes, peripheral vascular disease, and recent guillotine amputation of the right foot approximately 1 week prior to arrival.  Patient states that he has been having difficulty scheduling wound care to come to his house and has not had his dressing changed in the last week.  Patient's caregiver at bedside states that after she saw bleeding coming from this dressing she called the physician's office and was told to present to the emergency department as it was too late to come to the office.  Patient denies any fevers, chills, or worsening pain in this right lower extremity. ROS: Patient currently denies any vision changes, tinnitus, difficulty speaking, facial droop, sore throat, chest pain, shortness of breath, abdominal pain, nausea/vomiting/diarrhea, dysuria, or weakness/numbness/paresthesias in any extremity   Physical Exam  Triage Vital Signs: ED Triage Vitals  Encounter Vitals Group     BP 07/01/23 1902 135/74     Systolic BP Percentile --      Diastolic BP Percentile --      Pulse Rate 07/01/23 1902 71     Resp 07/01/23 1902 15     Temp 07/01/23 1902 98.8 F (37.1 C)     Temp Source 07/01/23 1902 Oral     SpO2 07/01/23 1902 100 %     Weight 07/01/23 1902 230 lb (104.3 kg)     Height 07/01/23 1902 6' (1.829 m)     Head Circumference --      Peak Flow --      Pain Score 07/01/23 1901 0     Pain Loc --      Pain Education --      Exclude from Growth Chart --    Most recent vital signs: Vitals:   07/01/23 1902 07/01/23 2300  BP: 135/74   Pulse: 71 68  Resp: 15   Temp: 98.8 F (37.1 C)   SpO2: 100% 99%   General: Awake, oriented x4. CV:  Good peripheral perfusion.  Resp:  Normal effort.  Abd:  No distention.   Other:  Elderly obese African-American male resting comfortably in no acute distress.  Status post guillotine amputation of the right foot with skin grafting in place, sutures, and granulation tissue apparent in the wound bed.  There is no evidence of surrounding erythema or induration and patient has minimal tenderness to palpation ED Results / Procedures / Treatments  Labs (all labs ordered are listed, but only abnormal results are displayed) Labs Reviewed  CBC WITH DIFFERENTIAL/PLATELET - Abnormal; Notable for the following components:      Result Value   RBC 3.83 (*)    Hemoglobin 11.4 (*)    HCT 35.8 (*)    All other components within normal limits  BASIC METABOLIC PANEL WITH GFR - Abnormal; Notable for the following components:   Sodium 134 (*)    Glucose, Bld 233 (*)    Creatinine, Ser 1.42 (*)    GFR, Estimated 53 (*)    All other components within normal limits  LACTIC ACID, PLASMA   RADIOLOGY ED MD interpretation: X-ray of the right foot independently interpreted and shows transmetatarsal amputation of the 1st through 5th digits with increased soft tissue swelling -Agree with radiology assessment Official radiology report(s): DG Foot Complete  Right Result Date: 07/01/2023 CLINICAL DATA:  Infection recent amputation EXAM: RIGHT FOOT COMPLETE - 3+ VIEW COMPARISON:  06/17/2023 FINDINGS: Status post transmetatarsal amputation the first through fifth digits. Cut margins remain smooth. Increased soft tissue swelling. No osseous destructive change IMPRESSION: Status post transmetatarsal amputation of first through fifth digits. Increased soft tissue swelling. Electronically Signed   By: Esmeralda Hedge M.D.   On: 07/01/2023 20:32   PROCEDURES: Critical Care performed: No Procedures MEDICATIONS ORDERED IN ED: Medications - No data to display IMPRESSION / MDM / ASSESSMENT AND PLAN / ED COURSE  I reviewed the triage vital signs and the nursing notes.                             The  patient is on the cardiac monitor to evaluate for evidence of arrhythmia and/or significant heart rate changes. Patient's presentation is most consistent with acute presentation with potential threat to life or bodily function. Presentation presents for wound check with no apparent evidence of cellulitis on exam Given History, Exam, and Workup I have low suspicion for Necrotizing Fasciitis, Abscess, Osteomyelitis, DVT or other emergent problem as a cause for this presentation.  Rx: Patient is already on antibiotics  Disposition: Discharge. No evidence of serious bacterial illness. Nontoxic appearing, VSS. Low risk for treatment failure based on history. Strict return precautions discussed with patient with full understanding. Advised patient to follow up promptly with primary care provider within next 48 hours.   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   Kore Madlock K, MD 07/02/23 [phone]

## 2023-07-01 NOTE — Telephone Encounter (Signed)
 Called in regards to wound care- patient has not had any wound care or dressing changes since last Wednesday 5/14. Noticed dried blood about an inch in length, no fresh blood seen/ no blood on any other surfaces other than patient sock. State that they were to be set up with wound care after that days appointment but still have not heard anything. They are currently being seen by an OT from [facility] and they spoke with someone there who stated that they were able to do the wound care that they would just need a order sent in and that there was no need for 2 different places to be coming out to see him. Want to know if he needs to bee seen by our office and see about getting the order updated for [facility]. Thanks

## 2023-07-01 NOTE — ED Triage Notes (Signed)
 Pt reports he had a partial amputation of this right foot, pt is supposed to get regular wound care for this, pt reports the people who are supposed to come out haven't been out. Pt last had wound care 1 week ago. Pt reports bleeding to wound today. Pt denies pain

## 2023-07-01 NOTE — Telephone Encounter (Signed)
 Patient is bleeding through the bandages.  [facility] needs orders and they can go do home health tomorrow.  Send orders to fax [phone]. Or can call and give verbal orders to [facility] at [phone] option 2.

## 2023-07-02 ENCOUNTER — Telehealth: Payer: Self-pay | Admitting: Podiatry

## 2023-07-02 ENCOUNTER — Ambulatory Visit (INDEPENDENT_AMBULATORY_CARE_PROVIDER_SITE_OTHER): Admitting: Podiatry

## 2023-07-02 DIAGNOSIS — Z794 Long term (current) use of insulin: Secondary | ICD-10-CM

## 2023-07-02 DIAGNOSIS — E1152 Type 2 diabetes mellitus with diabetic peripheral angiopathy with gangrene: Secondary | ICD-10-CM

## 2023-07-02 DIAGNOSIS — T8131XA Disruption of external operation (surgical) wound, not elsewhere classified, initial encounter: Secondary | ICD-10-CM

## 2023-07-02 NOTE — Telephone Encounter (Signed)
 Lacey Pian called stating [facility] has not received order for Home Nurse Care for patient. Contact telephone for [facility] [phone] Contact telephone for Lacey Pian [phone] Also, Lacey Pian is stating circle of blood is coming through the gauze placed on patient.

## 2023-07-02 NOTE — Progress Notes (Signed)
  Subjective:  Patient ID: Scott Hale, male    DOB: [birth year],  MRN: [id number]  Chief Complaint  Patient presents with   Wound Check   73 y.o. male returns for post-op check.   Review of Systems: Negative except as noted in the HPI. Denies N/V/F/Ch.   Objective:  There were no vitals filed for this visit. There is no height or weight on file to calculate BMI. Constitutional Well developed. Well nourished.  Vascular Foot warm and well perfused. Capillary refill normal to all digits.  Calf is soft and supple, no posterior calf or knee pain, negative Homans' sign  Neurologic Normal speech. Oriented to person, place, and time. Epicritic sensation to light touch grossly reduced bilaterally.  Dermatologic Superficial dehiscence noted with breakdown of amputation site.  Orthopedic: No pain to palpation noted about the surgical site.   Assessment:   No diagnosis found.  Plan:  Patient was evaluated and treated and all questions answered.  S/p foot surgery right - All questions and concerns were discussed with the patient in extensive detail.  Patient's wound does not show any clinical signs of infection.  I encouraged patient to do Betadine wet-to-dry dressing 3 times a week. -Patient will have home health set up to help with this. - Patient scheduled to see Dr. Terral Ferrari next week.     No follow-ups on file.   Right transmetatarsal amputation Betadine wet-to-dry 3 times a week.  Scheduled see Dr. Michalene Agee next week

## 2023-07-09 ENCOUNTER — Ambulatory Visit: Attending: Infectious Diseases | Admitting: Infectious Diseases

## 2023-07-09 ENCOUNTER — Encounter: Payer: Self-pay | Admitting: Infectious Diseases

## 2023-07-09 VITALS — BP 125/80 | HR 68 | Temp 97.0°F

## 2023-07-09 DIAGNOSIS — L7682 Other postprocedural complications of skin and subcutaneous tissue: Secondary | ICD-10-CM | POA: Diagnosis not present

## 2023-07-09 DIAGNOSIS — E1122 Type 2 diabetes mellitus with diabetic chronic kidney disease: Secondary | ICD-10-CM | POA: Diagnosis not present

## 2023-07-09 DIAGNOSIS — Z794 Long term (current) use of insulin: Secondary | ICD-10-CM | POA: Insufficient documentation

## 2023-07-09 DIAGNOSIS — Z79899 Other long term (current) drug therapy: Secondary | ICD-10-CM | POA: Diagnosis not present

## 2023-07-09 DIAGNOSIS — E11628 Type 2 diabetes mellitus with other skin complications: Secondary | ICD-10-CM | POA: Diagnosis not present

## 2023-07-09 DIAGNOSIS — B998 Other infectious disease: Secondary | ICD-10-CM | POA: Diagnosis present

## 2023-07-09 DIAGNOSIS — N189 Chronic kidney disease, unspecified: Secondary | ICD-10-CM | POA: Insufficient documentation

## 2023-07-09 DIAGNOSIS — I130 Hypertensive heart and chronic kidney disease with heart failure and stage 1 through stage 4 chronic kidney disease, or unspecified chronic kidney disease: Secondary | ICD-10-CM | POA: Diagnosis not present

## 2023-07-09 DIAGNOSIS — L089 Local infection of the skin and subcutaneous tissue, unspecified: Secondary | ICD-10-CM

## 2023-07-09 DIAGNOSIS — Z7984 Long term (current) use of oral hypoglycemic drugs: Secondary | ICD-10-CM | POA: Insufficient documentation

## 2023-07-09 DIAGNOSIS — Z96651 Presence of right artificial knee joint: Secondary | ICD-10-CM | POA: Insufficient documentation

## 2023-07-09 DIAGNOSIS — Z89431 Acquired absence of right foot: Secondary | ICD-10-CM | POA: Diagnosis not present

## 2023-07-09 DIAGNOSIS — I2510 Atherosclerotic heart disease of native coronary artery without angina pectoris: Secondary | ICD-10-CM | POA: Diagnosis not present

## 2023-07-09 DIAGNOSIS — Z7985 Long-term (current) use of injectable non-insulin antidiabetic drugs: Secondary | ICD-10-CM | POA: Insufficient documentation

## 2023-07-09 DIAGNOSIS — E785 Hyperlipidemia, unspecified: Secondary | ICD-10-CM | POA: Diagnosis not present

## 2023-07-09 DIAGNOSIS — E11621 Type 2 diabetes mellitus with foot ulcer: Secondary | ICD-10-CM | POA: Insufficient documentation

## 2023-07-09 DIAGNOSIS — B9561 Methicillin susceptible Staphylococcus aureus infection as the cause of diseases classified elsewhere: Secondary | ICD-10-CM | POA: Insufficient documentation

## 2023-07-09 DIAGNOSIS — I509 Heart failure, unspecified: Secondary | ICD-10-CM | POA: Insufficient documentation

## 2023-07-09 DIAGNOSIS — Z951 Presence of aortocoronary bypass graft: Secondary | ICD-10-CM | POA: Diagnosis not present

## 2023-07-09 MED ORDER — AMOXICILLIN-POT CLAVULANATE 875-125 MG PO TABS: 1.0000 | ORAL_TABLET | Freq: Two times a day (BID) | ORAL | 1 refills | Status: DC

## 2023-07-09 NOTE — Progress Notes (Signed)
 NAME: Scott Hale  DOB: 10-02-1950  MRN: [id number]  Date/Time: 07/09/2023 11:02 AM   Pt agreed to the use of AI scribe Here with his wife   ?follow up after recent hospitalization Scott Hale is a 73 y.o. with a history of  DM, HTN, HLD , CAD s/p CABG, RT TKA, CKD was in the hospital 4/25-06/19/23 with rt foot infection  He underwent a transmetatarsal amputation and was discharged from the hospital on Jun 19, 2023, culture was MSSA , pathology clear of osteo at the proximal margin  He completed a two-week course of oral antibiotics last week. The surgical site is healing well except for one spot near the little toes that is not healing as expected. He has been visiting his podiatrist regularly, with the next appointment scheduled for tomorrow.  On Jul 01, 2023, he experienced an episode of syncope and tachycardia after being overworked by physical therapy and occupational therapy, leading to an emergency room visit. He does not recall receiving fluids during this visit. A subsequent emergency room visit was for a wound check due to bleeding.  He is currently not engaging in physical therapy due to the previous incident of syncope. At home, he uses a urinal and a port-a-pot to avoid putting pressure on his foot and keeps his leg elevated, often using a recliner. No fever or chills are present.   He drinks a significant amount of water daily, consuming a 26-ounce container four to five times a day.  Past Medical History:  Diagnosis Date   Arthritis    CHF (congestive heart failure) (HCC)    Diabetes mellitus without complication (HCC)    Erectile dysfunction    History of kidney stones    Hypertension    Sleep apnea     Past Surgical History:  Procedure Laterality Date   CARDIAC CATHETERIZATION     COLONOSCOPY N/A 03/07/2021   Procedure: COLONOSCOPY;  Surgeon: [name], MD;  Location: [facility] ENDOSCOPY;  Service: Endoscopy;  Laterality: N/A;  IDDM   COLONOSCOPY WITH  PROPOFOL  N/A 04/09/2017   Procedure: COLONOSCOPY WITH PROPOFOL ;  Surgeon: [name], MD;  Location: [facility] ENDOSCOPY;  Service: Gastroenterology;  Laterality: N/A;   CORONARY ARTERY BYPASS GRAFT  2003   GRAFT APPLICATION  06/17/2023   Procedure: GRAFT APPLICATION WITH DELAYED PRIMARY CLOSURE;  Surgeon: Evertt Hoe, DPM;  Location: [facility] ORS;  Service: Orthopedics/Podiatry;;   IRRIGATION AND DEBRIDEMENT FOOT Right 06/17/2023   Procedure: IRRIGATION AND DEBRIDEMENT FOOT;  Surgeon: Evertt Hoe, DPM;  Location: [facility] ORS;  Service: Orthopedics/Podiatry;  Laterality: Right;   JOINT REPLACEMENT     total knee   LOWER EXTREMITY ANGIOGRAPHY Right 06/13/2023   Procedure: Lower Extremity Angiography;  Surgeon: Celso College, MD;  Location: [facility] INVASIVE CV LAB;  Service: Cardiovascular;  Laterality: Right;   RENAL ARTERY STENT     due to kidney stones   TRANSMETATARSAL AMPUTATION Right 06/14/2023   Procedure: AMPUTATION, FOOT, TRANSMETATARSAL;  Surgeon: Evertt Hoe, DPM;  Location: [facility] ORS;  Service: Orthopedics/Podiatry;  Laterality: Right;    Social History   Socioeconomic History   Marital status: Single    Spouse name: Not on file   Number of children: 5   Years of education: 67   Highest education level: 12th grade  Occupational History   Occupation: retired  Tobacco Use   Smoking status: Never   Smokeless tobacco: Never  Vaping Use   Vaping status: Never Used  Substance and Sexual  Activity   Alcohol use: No   Drug use: No   Sexual activity: Yes    Birth control/protection: Condom  Other Topics Concern   Not on file  Social History Narrative   Not on file   Social Drivers of Health   Financial Resource Strain: Medium Risk (12/05/2021)   Received from [facility], [facility]   Overall Financial Resource Strain (CARDIA)    Difficulty of Paying Living Expenses: Somewhat hard  Food Insecurity: Food Insecurity Present  (06/08/2023)   Hunger Vital Sign    Worried About Running Out of Food in the Last Year: Sometimes true    Ran Out of Food in the Last Year: Never true  Transportation Needs: No Transportation Needs (06/08/2023)   PRAPARE - Administrator, Civil Service (Medical): No    Lack of Transportation (Non-Medical): No  Physical Activity: Sufficiently Active (03/26/2017)   Exercise Vital Sign    Days of Exercise per Week: 7 days    Minutes of Exercise per Session: 60 min  Stress: No Stress Concern Present (03/26/2017)   [employer] of Occupational Health - Occupational Stress Questionnaire    Feeling of Stress : Only a little  Social Connections: Moderately Isolated (06/08/2023)   Social Connection and Isolation Panel [NHANES]    Frequency of Communication with Friends and Family: More than three times a week    Frequency of Social Gatherings with Friends and Family: Once a week    Attends Religious Services: More than 4 times per year    Active Member of [employer] or Organizations: No    Attends Banker Meetings: Never    Marital Status: Never married  Intimate Partner Violence: Not At Risk (06/08/2023)   Humiliation, Afraid, Rape, and Kick questionnaire    Fear of Current or Ex-Partner: No    Emotionally Abused: No    Physically Abused: No    Sexually Abused: No    Family History  Problem Relation Age of Onset   Diabetes Mother    Alcoholism Father    Diabetes Sister    Diabetes Sister    Arrhythmia Sister    Diabetes Sister    No Known Allergies I? Current Outpatient Medications  Medication Sig Dispense Refill   acetaminophen  (TYLENOL ) 325 MG tablet Take 650 mg by mouth every 6 (six) hours as needed for mild pain, headache, fever or moderate pain.     ASPIRIN  81 PO Take by mouth daily.     atorvastatin  (LIPITOR) 40 MG tablet TAKE 1 TABLET BY MOUTH EVERY DAY *NEW PRESCRIPTION REQUEST* 90 tablet 1   carvedilol  (COREG ) 3.125 MG tablet TAKE ONE (1) TABLET BY  MOUTH TWICE DAILY *NEW PRESCRIPTION REQUEST* 180 tablet 1   cholecalciferol (VITAMIN D) 25 MCG (1000 UT) tablet Take 1,000 Units by mouth daily.     clopidogrel  (PLAVIX ) 75 MG tablet Take 1 tablet (75 mg total) by mouth daily. 30 tablet 0   dapagliflozin  propanediol (FARXIGA ) 10 MG TABS tablet TAKE 1 TABLET BY MOUTH EVERY DAY BEFORE BREAKFAST *NEW PRESCRIPTION REQUEST* 90 tablet 1   Dulaglutide 1.5 MG/0.5ML SOPN Inject 1.5 mg into the skin once a week. Mondays     furosemide  (LASIX ) 40 MG tablet TAKE 1 TABLET BY MOUTH EVERY DAY *NEW PRESCRIPTION REQUEST* 90 tablet 1   glipiZIDE (GLUCOTROL) 10 MG tablet Take 10 mg by mouth daily before breakfast.     insulin  glargine (LANTUS ) 100 UNIT/ML Solostar Pen Inject 45 Units  into the skin at bedtime. 15 mL 0   Niacin, Antihyperlipidemic, 500 MG TABS Take 500 mg by mouth daily with breakfast.     sacubitril -valsartan  (ENTRESTO ) 97-103 MG Take 1 tablet by mouth 2 (two) times daily. 180 tablet 1   spironolactone  (ALDACTONE ) 25 MG tablet Take 25 mg by mouth daily.     tadalafil (CIALIS) 5 MG tablet Take by mouth daily as needed for erectile dysfunction.     vitamin B-12 (CYANOCOBALAMIN) 500 MCG tablet Take 500 mcg by mouth daily.     No current facility-administered medications for this visit.     Abtx:  Anti-infectives (From admission, onward)    None       REVIEW OF SYSTEMS:  Const: negative fever, negative chills, negative weight loss Eyes: negative diplopia or visual changes, negative eye pain ENT: negative coryza, negative sore throat Resp: negative cough, hemoptysis, dyspnea Cards: negative for chest pain, palpitations, lower extremity edema GU: negative for frequency, dysuria and hematuria GI: Negative for abdominal pain, diarrhea, bleeding, constipation Skin: negative for rash and pruritus Heme: negative for easy bruising and gum/nose bleeding MS: as above Neurolo:negative for headaches, dizziness, vertigo, memory problems  Psych:  negative for feelings of anxiety, depression  Endocrine: negative for thyroid, diabetes Allergy/Immunology- negative for any medication or food allergies ?  Objective:  VITALS:  BP 125/80   Pulse 68   Temp (!) 97 F (36.1 C) (Temporal)   SpO2 98%   PHYSICAL EXAM:  General: Alert, cooperative, no distress, appears young for  age. In wheel chair Head: Normocephalic, without obvious abnormality, atraumatic. Eyes: Conjunctivae clear, anicteric sclerae. Pupils are equal ENT Nares normal. No drainage or sinus tenderness. Lips, mucosa, and tongue normal. No Thrush Neck: Supple, symmetrical, no adenopathy, thyroid: non tender no carotid bruit and no JVD. Back: No CVA tenderness. Lungs: Clear to auscultation bilaterally. No Wheezing or Rhonchi. No rales. Heart: Regular rate and rhythm, no murmur, rub or gallop. Abdomen: not examined Extremities:rt TMA site- staples present has some superficial eschar/necrosis at the lateral area near the staple line some superficial erosion       Skin: No rashes or lesions. Or bruising Lymph: Cervical, supraclavicular normal. Neurologic: Grossly non-focal Pertinent Labs Lab Results CBC    Component Value Date/Time   WBC 7.0 07/01/2023 1903   RBC 3.83 (L) 07/01/2023 1903   HGB 11.4 (L) 07/01/2023 1903   HCT 35.8 (L) 07/01/2023 1903   PLT 332 07/01/2023 1903   MCV 93.5 07/01/2023 1903   MCH 29.8 07/01/2023 1903   MCHC 31.8 07/01/2023 1903   RDW 14.0 07/01/2023 1903   LYMPHSABS 2.4 07/01/2023 1903   MONOABS 0.6 07/01/2023 1903   EOSABS 0.3 07/01/2023 1903   BASOSABS 0.1 07/01/2023 1903       Latest Ref Rng & Units 07/01/2023    7:03 PM 06/25/2023    2:46 PM 06/19/2023    3:33 AM  CMP  Glucose 70 - 99 mg/dL 657  96  846   BUN 8 - 23 mg/dL 23  16  17    Creatinine 0.61 - 1.24 mg/dL 9.62  9.52  8.41   Sodium 135 - 145 mmol/L 134  136  131   Potassium 3.5 - 5.1 mmol/L 4.3  4.4  4.6   Chloride 98 - 111 mmol/L 99  100  98   CO2 22 - 32  mmol/L 25  26  25    Calcium  8.9 - 10.3 mg/dL 9.0  9.4  8.7   Total  Protein 6.5 - 8.1 g/dL  8.6    Total Bilirubin 0.0 - 1.2 mg/dL  0.4    Alkaline Phos 38 - 126 U/L  73    AST 15 - 41 U/L  25    ALT 0 - 44 U/L  40       ? Impression/Recommendation  Diabetes with rt Diabetic foot infection  He is status post transmetatarsal amputation with ongoing wound care. Some superficial eschar  He reports no current pain and is advised to keep the leg elevated to reduce swelling, which can cause stitches to give way. Avoid crossing legs and putting pressure on the foot when moving. Use a urinal and port-a-pot to prevent pressure on the wound. He had a Staphylococcus aureus infection from culture I-no fever or chills. Previous pathology showed no osteo at the proximal margin of the amputation site. Will continue augmentin  for 4 more weeks till the wound heals.  There is a wound healing complication at the amputation site, particularly near the lateral margin. The wound is not healing as expected, possibly due to insufficient skin to close the area and swelling causing tension on the wound edges. He should keep the leg elevated to prevent further swelling and tension. Apply Betadine and dress the wound regularly. Follow up with  podiatrist for wound management and schedule blood work in June to check inflammatory markers.  DM on farxiga  and glipizide  CAD s/p CABG CHF- on entresto , carvedilol ?________________________________________________ Discussed with patient,and wife Follow up 6 weeks

## 2023-07-09 NOTE — Patient Instructions (Signed)
 You had a follow-up appointment today after your transmetatarsal amputation and infection management. Your surgical site is healing well except for one spot near the little toes. You also experienced an episode of syncope and tachycardia after physical therapy, which led to an emergency room visit. Currently, you are not engaging in physical therapy and are taking measures to avoid putting pressure on your foot.  YOUR PLAN:  -TRANSMETATARSAL AMPUTATION: A transmetatarsal amputation is a surgical procedure where part of the foot is removed. You are advised to keep your leg elevated to reduce swelling, avoid crossing your legs, and not put pressure on your foot when moving. Use a urinal and port-a-pot to prevent pressure on the wound.  -WOUND HEALING COMPLICATION: There is a complication with the healing of your wound near the little toes, possibly due to insufficient skin to close the area and swelling causing tension on the wound edges. Keep your leg elevated to prevent further swelling and tension, apply Betadine, and dress the wound regularly. Follow up with your podiatrist for wound management and schedule blood work in June to check inflammatory markers.  -STAPHYLOCOCCUS AUREUS INFECTION: A Staphylococcus aureus infection is a bacterial infection. You have no signs of systemic infection such as fever or chills. Continue taking Augmentin , one pill in the morning and one in the evening, and ensure you drink plenty of water to support kidney function.  INSTRUCTIONS:  Follow up with your podiatrist for wound management as scheduled. Schedule blood work in June to check inflammatory markers.

## 2023-07-10 ENCOUNTER — Ambulatory Visit (INDEPENDENT_AMBULATORY_CARE_PROVIDER_SITE_OTHER): Admitting: Podiatry

## 2023-07-10 ENCOUNTER — Encounter: Payer: Self-pay | Admitting: Podiatry

## 2023-07-10 DIAGNOSIS — T8131XD Disruption of external operation (surgical) wound, not elsewhere classified, subsequent encounter: Secondary | ICD-10-CM

## 2023-07-11 ENCOUNTER — Encounter: Payer: Self-pay | Admitting: Podiatry

## 2023-07-11 NOTE — Progress Notes (Signed)
  Subjective:  Patient ID: Scott Hale, male    DOB: Feb 28, 1950,  MRN: [id number]  Chief Complaint  Patient presents with   Post-op Problem    post op-no xrays suture removal "It's good."   73 y.o. male returns for post-op check.   Review of Systems: Negative except as noted in the HPI. Denies N/V/F/Ch.   Objective:  There were no vitals filed for this visit. There is no height or weight on file to calculate BMI. Constitutional Well developed. Well nourished.  Vascular Foot warm and well perfused. Capillary refill normal to all digits.  Calf is soft and supple, no posterior calf or knee pain, negative Homans' sign  Neurologic Normal speech. Oriented to person, place, and time. Epicritic sensation to light touch grossly reduced bilaterally.  Dermatologic Large amount of dehiscence at the central incision there is necrosis of the distal flap that is relatively superficial with eschar  Orthopedic: No pain to palpation noted about the surgical site.     Assessment:   1. Dehiscence of operative wound, subsequent encounter    Plan:  Patient was evaluated and treated and all questions answered.  S/p foot surgery right - He is approximately 4 weeks out from surgery.  Sutures and staples removed in order to debride the eschar and wound.  He does have relatively superficial distance although the central portion does probe deep but not to bone currently.  I debrided a large amount of the eschar today.  Will discuss limb salvage options with operative debridement graft application and VAC.  If this is unsuccessful he will likely need BKA      Return in about 2 weeks (around 07/24/2023) for wound care.

## 2023-07-16 ENCOUNTER — Other Ambulatory Visit (INDEPENDENT_AMBULATORY_CARE_PROVIDER_SITE_OTHER): Payer: Self-pay | Admitting: Vascular Surgery

## 2023-07-16 DIAGNOSIS — I739 Peripheral vascular disease, unspecified: Secondary | ICD-10-CM

## 2023-07-19 ENCOUNTER — Encounter (INDEPENDENT_AMBULATORY_CARE_PROVIDER_SITE_OTHER): Payer: Self-pay | Admitting: Vascular Surgery

## 2023-07-19 ENCOUNTER — Ambulatory Visit (INDEPENDENT_AMBULATORY_CARE_PROVIDER_SITE_OTHER): Admitting: Vascular Surgery

## 2023-07-19 ENCOUNTER — Ambulatory Visit (INDEPENDENT_AMBULATORY_CARE_PROVIDER_SITE_OTHER)

## 2023-07-19 VITALS — BP 114/72 | HR 57 | Resp 16

## 2023-07-19 DIAGNOSIS — I739 Peripheral vascular disease, unspecified: Secondary | ICD-10-CM | POA: Diagnosis not present

## 2023-07-19 DIAGNOSIS — Z9889 Other specified postprocedural states: Secondary | ICD-10-CM

## 2023-07-19 DIAGNOSIS — E1152 Type 2 diabetes mellitus with diabetic peripheral angiopathy with gangrene: Secondary | ICD-10-CM | POA: Diagnosis not present

## 2023-07-19 DIAGNOSIS — Z794 Long term (current) use of insulin: Secondary | ICD-10-CM

## 2023-07-19 DIAGNOSIS — I10 Essential (primary) hypertension: Secondary | ICD-10-CM | POA: Diagnosis not present

## 2023-07-19 NOTE — Progress Notes (Signed)
 Subjective:    Patient ID: Scott Hale, male    DOB: 10-03-1950, 73 y.o.   MRN: [id number] Chief Complaint  Patient presents with   Follow-up    [facility] 3 week with ABI    Scott Hale is a 73 year old male who is now 3 weeks post right lower extremity angiogram with angioplasty and stent placement.  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  Today he returns to clinic without any complaints to his right lower extremity.  Patient endorses healing well and he is just seeing podiatry who confirms.  He does have pain to the foot still as he is learning to walk.  No other complaints or pain or swelling noted to his right lower extremity.  However he does complain of pain to his left lower extremity.  He states that there was a conversation while he was in the hospital that we would have to follow-up and look into his left lower extremity vasculature.  Today he underwent arterial ultrasounds with ABIs.  He is noted to have monophasic pulses only in his left lower extremity.  Today's ABI is 0.55 and TBI is 0.31.  There are no known previous ABIs or TBI's to compare to.  Patient's caretaker is at his side today.  No other issues to note overall he feels well.    Review of Systems  Constitutional: Negative.   Musculoskeletal:        Patient postop right foot transmetatarsal amputation.  Having pain but healing.  This is creating gait problems as he is learning to walk without any toes.  Skin:        Skin to the patient's right lower extremity is dry and scaly.  Neurological: Negative.   Psychiatric/Behavioral: Negative.    All other systems reviewed and are negative.      Objective:    Physical Exam Constitutional:      Appearance: Normal appearance. He is normal weight.  HENT:     Head: Normocephalic.  Eyes:     Pupils: Pupils are equal, round, and reactive to light.  Cardiovascular:     Rate and Rhythm: Normal rate and regular  rhythm.     Pulses: Normal pulses.     Heart sounds: Normal heart sounds.  Pulmonary:     Effort: Pulmonary effort is normal.     Breath sounds: Normal breath sounds.  Abdominal:     General: Abdomen is flat. Bowel sounds are normal.     Palpations: Abdomen is soft.  Musculoskeletal:        General: Tenderness and deformity present.     Cervical back: Normal range of motion.     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  Still healing postsurgery.  Patient with claudication to his left lower extremity with pain.  Skin:    General: Skin is warm and dry.     Capillary Refill: Capillary refill takes more than 3 seconds. Capillary refill is greater than 3 seconds to his left lower extremity. Neurological:     General: No focal deficit present.     Mental Status: He is alert and oriented to person, place, and time. Mental status is at baseline.  Psychiatric:        Mood and Affect: Mood normal.        Behavior: Behavior normal.        Thought Content: Thought content normal.  Judgment: Judgment normal.     BP 114/72   Pulse (!) 57   Resp 16   Past Medical History:  Diagnosis Date   Arthritis    CHF (congestive heart failure) (HCC)    Diabetes mellitus without complication (HCC)    Erectile dysfunction    History of kidney stones    Hypertension    Sleep apnea     Social History   Socioeconomic History   Marital status: Single    Spouse name: Not on file   Number of children: 5   Years of education: 12   Highest education level: 12th grade  Occupational History   Occupation: retired  Tobacco Use   Smoking status: Never   Smokeless tobacco: Never  Vaping Use   Vaping status: Never Used  Substance and Sexual Activity   Alcohol use: No   Drug use: No   Sexual activity: Yes    Birth control/protection: Condom  Other Topics Concern   Not on file  Social History Narrative   Not on file   Social Drivers  of Health   Financial Resource Strain: Medium Risk (12/05/2021)   Received from [facility], [facility]   Overall Financial Resource Strain (CARDIA)    Difficulty of Paying Living Expenses: Somewhat hard  Food Insecurity: Food Insecurity Present (06/08/2023)   Hunger Vital Sign    Worried About Running Out of Food in the Last Year: Sometimes true    Ran Out of Food in the Last Year: Never true  Transportation Needs: No Transportation Needs (06/08/2023)   PRAPARE - Administrator, Civil Service (Medical): No    Lack of Transportation (Non-Medical): No  Physical Activity: Sufficiently Active (03/26/2017)   Exercise Vital Sign    Days of Exercise per Week: 7 days    Minutes of Exercise per Session: 60 min  Stress: No Stress Concern Present (03/26/2017)   [employer] of Occupational Health - Occupational Stress Questionnaire    Feeling of Stress : Only a little  Social Connections: Moderately Isolated (06/08/2023)   Social Connection and Isolation Panel [NHANES]    Frequency of Communication with Friends and Family: More than three times a week    Frequency of Social Gatherings with Friends and Family: Once a week    Attends Religious Services: More than 4 times per year    Active Member of [employer] or Organizations: No    Attends Banker Meetings: Never    Marital Status: Never married  Intimate Partner Violence: Not At Risk (06/08/2023)   Humiliation, Afraid, Rape, and Kick questionnaire    Fear of Current or Ex-Partner: No    Emotionally Abused: No    Physically Abused: No    Sexually Abused: No    Past Surgical History:  Procedure Laterality Date   CARDIAC CATHETERIZATION     COLONOSCOPY N/A 03/07/2021   Procedure: COLONOSCOPY;  Surgeon: [name], MD;  Location: [facility] ENDOSCOPY;  Service: Endoscopy;  Laterality: N/A;  IDDM   COLONOSCOPY WITH PROPOFOL  N/A 04/09/2017   Procedure: COLONOSCOPY WITH PROPOFOL ;   Surgeon: [name], MD;  Location: [facility] ENDOSCOPY;  Service: Gastroenterology;  Laterality: N/A;   CORONARY ARTERY BYPASS GRAFT  2003   GRAFT APPLICATION  06/17/2023   Procedure: GRAFT APPLICATION WITH DELAYED PRIMARY CLOSURE;  Surgeon: Evertt Hoe, DPM;  Location: [facility] ORS;  Service: Orthopedics/Podiatry;;   IRRIGATION AND DEBRIDEMENT FOOT Right 06/17/2023   Procedure: IRRIGATION AND DEBRIDEMENT FOOT;  Surgeon: Evertt Hoe, DPM;  Location: [facility] ORS;  Service: Orthopedics/Podiatry;  Laterality: Right;   JOINT REPLACEMENT     total knee   LOWER EXTREMITY ANGIOGRAPHY Right 06/13/2023   Procedure: Lower Extremity Angiography;  Surgeon: Celso College, MD;  Location: [facility] INVASIVE CV LAB;  Service: Cardiovascular;  Laterality: Right;   RENAL ARTERY STENT     due to kidney stones   TRANSMETATARSAL AMPUTATION Right 06/14/2023   Procedure: AMPUTATION, FOOT, TRANSMETATARSAL;  Surgeon: Evertt Hoe, DPM;  Location: [facility] ORS;  Service: Orthopedics/Podiatry;  Laterality: Right;    Family History  Problem Relation Age of Onset   Diabetes Mother    Alcoholism Father    Diabetes Sister    Diabetes Sister    Arrhythmia Sister    Diabetes Sister     No Known Allergies     Latest Ref Rng & Units 07/01/2023    7:03 PM 06/25/2023    2:46 PM 06/19/2023    3:33 AM  CBC  WBC 4.0 - 10.5 K/uL 7.0  9.4  12.9   Hemoglobin 13.0 - 17.0 g/dL 82.9  56.2  9.8   Hematocrit 39.0 - 52.0 % 35.8  34.0  30.1   Platelets 150 - 400 K/uL 332  431  417        CMP     Component Value Date/Time   NA 134 (L) 07/01/2023 1903   K 4.3 07/01/2023 1903   CL 99 07/01/2023 1903   CO2 25 07/01/2023 1903   GLUCOSE 233 (H) 07/01/2023 1903   BUN 23 07/01/2023 1903   CREATININE 1.42 (H) 07/01/2023 1903   CALCIUM  9.0 07/01/2023 1903   PROT 8.6 (H) 06/25/2023 1446   ALBUMIN 2.8 (L) 06/25/2023 1446   AST 25 06/25/2023 1446   ALT 40 06/25/2023 1446   ALKPHOS 73 06/25/2023 1446   BILITOT 0.4  06/25/2023 1446   GFRNONAA 53 (L) 07/01/2023 1903     No results found.     Assessment & Plan:   1. Claudication of left lower extremity (HCC) (Primary) Patient presents today in clinic for postop follow-up from aortogram with selective right lower extremity angiogram with stent placement to the proximal right anterior tibial artery and stent placement to the distal right SFA and most proximal popliteal artery.  Patient has no complaints related to his right leg.  Says it feels good.  He did have a transmetatarsal amputation of the right foot while in the hospital after the angiogram.  He still continues to heal and see podiatry for this.  Otherwise no complaints to his right lower extremity.  However his left lower extremity on vascular arterial ultrasounds with ABIs today is compromised.  Today's ABI is 0.55 with TBI of 0.31.  He has monophasic pulses only.  Left lower extremity remains warm to touch but patient complains of pain upon any ambulation to his left lower extremity.  His leg often becomes tight if he stands for a period of time or walks.  Vascular surgery today recommends that the patient undergo a left lower extremity angiogram with possible intervention.  I discussed in detail today in clinic with his caretaker at his side the procedure, benefits, risk, and complications.  Both verbalized understanding wish to proceed.  He basically understands the same as the angiogram he had to his right leg prior to surgery which will now be doing to his left leg.  We will get this approved and then call the patient to schedule accordingly.  Both caretaker and patient are in agreement with the plan.  2. Primary hypertension Continue antihypertensive medications as already ordered, these medications have been reviewed and there are no changes at this time.  3. Type 2 diabetes mellitus with diabetic peripheral angiopathy and gangrene, with long-term current use of insulin  (HCC) Continue  hypoglycemic medications as already ordered, these medications have been reviewed and there are no changes at this time.  Hgb A1C to be monitored as already arranged by primary service   Current Outpatient Medications on File Prior to Visit  Medication Sig Dispense Refill   acetaminophen  (TYLENOL ) 325 MG tablet Take 650 mg by mouth every 6 (six) hours as needed for mild pain, headache, fever or moderate pain.     amoxicillin -clavulanate (AUGMENTIN ) 875-125 MG tablet Take 1 tablet by mouth 2 (two) times daily. 60 tablet 1   ASPIRIN  81 PO Take by mouth daily.     atorvastatin  (LIPITOR) 40 MG tablet TAKE 1 TABLET BY MOUTH EVERY DAY *NEW PRESCRIPTION REQUEST* 90 tablet 1   carvedilol  (COREG ) 3.125 MG tablet TAKE ONE (1) TABLET BY MOUTH TWICE DAILY *NEW PRESCRIPTION REQUEST* 180 tablet 1   cholecalciferol (VITAMIN D) 25 MCG (1000 UT) tablet Take 1,000 Units by mouth daily.     clopidogrel  (PLAVIX ) 75 MG tablet Take 1 tablet (75 mg total) by mouth daily. 30 tablet 0   dapagliflozin  propanediol (FARXIGA ) 10 MG TABS tablet TAKE 1 TABLET BY MOUTH EVERY DAY BEFORE BREAKFAST *NEW PRESCRIPTION REQUEST* 90 tablet 1   Dulaglutide 1.5 MG/0.5ML SOPN Inject 1.5 mg into the skin once a week. Mondays     furosemide  (LASIX ) 40 MG tablet TAKE 1 TABLET BY MOUTH EVERY DAY *NEW PRESCRIPTION REQUEST* 90 tablet 1   glipiZIDE (GLUCOTROL) 10 MG tablet Take 10 mg by mouth daily before breakfast.     insulin  glargine (LANTUS ) 100 UNIT/ML Solostar Pen Inject 45 Units into the skin at bedtime. 15 mL 0   Niacin, Antihyperlipidemic, 500 MG TABS Take 500 mg by mouth daily with breakfast.     sacubitril -valsartan  (ENTRESTO ) 97-103 MG Take 1 tablet by mouth 2 (two) times daily. 180 tablet 1   spironolactone  (ALDACTONE ) 25 MG tablet Take 25 mg by mouth daily.     tadalafil (CIALIS) 5 MG tablet Take by mouth daily as needed for erectile dysfunction.     vitamin B-12 (CYANOCOBALAMIN) 500 MCG tablet Take 500 mcg by mouth daily.      No current facility-administered medications on file prior to visit.    There are no Patient Instructions on file for this visit. No follow-ups on file.   Annamaria Barrette, NP

## 2023-07-19 NOTE — H&P (View-Only) (Signed)
 Subjective:    Patient ID: Scott Hale, male    DOB: 10-03-1950, 73 y.o.   MRN: [id number] Chief Complaint  Patient presents with   Follow-up    [facility] 3 week with ABI    Scott Hale is a 73 year old male who is now 3 weeks post right lower extremity angiogram with angioplasty and stent placement.  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  Today he returns to clinic without any complaints to his right lower extremity.  Patient endorses healing well and he is just seeing podiatry who confirms.  He does have pain to the foot still as he is learning to walk.  No other complaints or pain or swelling noted to his right lower extremity.  However he does complain of pain to his left lower extremity.  He states that there was a conversation while he was in the hospital that we would have to follow-up and look into his left lower extremity vasculature.  Today he underwent arterial ultrasounds with ABIs.  He is noted to have monophasic pulses only in his left lower extremity.  Today's ABI is 0.55 and TBI is 0.31.  There are no known previous ABIs or TBI's to compare to.  Patient's caretaker is at his side today.  No other issues to note overall he feels well.    Review of Systems  Constitutional: Negative.   Musculoskeletal:        Patient postop right foot transmetatarsal amputation.  Having pain but healing.  This is creating gait problems as he is learning to walk without any toes.  Skin:        Skin to the patient's right lower extremity is dry and scaly.  Neurological: Negative.   Psychiatric/Behavioral: Negative.    All other systems reviewed and are negative.      Objective:    Physical Exam Constitutional:      Appearance: Normal appearance. He is normal weight.  HENT:     Head: Normocephalic.  Eyes:     Pupils: Pupils are equal, round, and reactive to light.  Cardiovascular:     Rate and Rhythm: Normal rate and regular  rhythm.     Pulses: Normal pulses.     Heart sounds: Normal heart sounds.  Pulmonary:     Effort: Pulmonary effort is normal.     Breath sounds: Normal breath sounds.  Abdominal:     General: Abdomen is flat. Bowel sounds are normal.     Palpations: Abdomen is soft.  Musculoskeletal:        General: Tenderness and deformity present.     Cervical back: Normal range of motion.     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  Still healing postsurgery.  Patient with claudication to his left lower extremity with pain.  Skin:    General: Skin is warm and dry.     Capillary Refill: Capillary refill takes more than 3 seconds. Capillary refill is greater than 3 seconds to his left lower extremity. Neurological:     General: No focal deficit present.     Mental Status: He is alert and oriented to person, place, and time. Mental status is at baseline.  Psychiatric:        Mood and Affect: Mood normal.        Behavior: Behavior normal.        Thought Content: Thought content normal.  Judgment: Judgment normal.     BP 114/72   Pulse (!) 57   Resp 16   Past Medical History:  Diagnosis Date   Arthritis    CHF (congestive heart failure) (HCC)    Diabetes mellitus without complication (HCC)    Erectile dysfunction    History of kidney stones    Hypertension    Sleep apnea     Social History   Socioeconomic History   Marital status: Single    Spouse name: Not on file   Number of children: 5   Years of education: 12   Highest education level: 12th grade  Occupational History   Occupation: retired  Tobacco Use   Smoking status: Never   Smokeless tobacco: Never  Vaping Use   Vaping status: Never Used  Substance and Sexual Activity   Alcohol use: No   Drug use: No   Sexual activity: Yes    Birth control/protection: Condom  Other Topics Concern   Not on file  Social History Narrative   Not on file   Social Drivers  of Health   Financial Resource Strain: Medium Risk (12/05/2021)   Received from [facility], [facility]   Overall Financial Resource Strain (CARDIA)    Difficulty of Paying Living Expenses: Somewhat hard  Food Insecurity: Food Insecurity Present (06/08/2023)   Hunger Vital Sign    Worried About Running Out of Food in the Last Year: Sometimes true    Ran Out of Food in the Last Year: Never true  Transportation Needs: No Transportation Needs (06/08/2023)   PRAPARE - Administrator, Civil Service (Medical): No    Lack of Transportation (Non-Medical): No  Physical Activity: Sufficiently Active (03/26/2017)   Exercise Vital Sign    Days of Exercise per Week: 7 days    Minutes of Exercise per Session: 60 min  Stress: No Stress Concern Present (03/26/2017)   [employer] of Occupational Health - Occupational Stress Questionnaire    Feeling of Stress : Only a little  Social Connections: Moderately Isolated (06/08/2023)   Social Connection and Isolation Panel [NHANES]    Frequency of Communication with Friends and Family: More than three times a week    Frequency of Social Gatherings with Friends and Family: Once a week    Attends Religious Services: More than 4 times per year    Active Member of [employer] or Organizations: No    Attends Banker Meetings: Never    Marital Status: Never married  Intimate Partner Violence: Not At Risk (06/08/2023)   Humiliation, Afraid, Rape, and Kick questionnaire    Fear of Current or Ex-Partner: No    Emotionally Abused: No    Physically Abused: No    Sexually Abused: No    Past Surgical History:  Procedure Laterality Date   CARDIAC CATHETERIZATION     COLONOSCOPY N/A 03/07/2021   Procedure: COLONOSCOPY;  Surgeon: [name], MD;  Location: [facility] ENDOSCOPY;  Service: Endoscopy;  Laterality: N/A;  IDDM   COLONOSCOPY WITH PROPOFOL  N/A 04/09/2017   Procedure: COLONOSCOPY WITH PROPOFOL ;   Surgeon: [name], MD;  Location: [facility] ENDOSCOPY;  Service: Gastroenterology;  Laterality: N/A;   CORONARY ARTERY BYPASS GRAFT  2003   GRAFT APPLICATION  06/17/2023   Procedure: GRAFT APPLICATION WITH DELAYED PRIMARY CLOSURE;  Surgeon: Evertt Hoe, DPM;  Location: [facility] ORS;  Service: Orthopedics/Podiatry;;   IRRIGATION AND DEBRIDEMENT FOOT Right 06/17/2023   Procedure: IRRIGATION AND DEBRIDEMENT FOOT;  Surgeon: Evertt Hoe, DPM;  Location: [facility] ORS;  Service: Orthopedics/Podiatry;  Laterality: Right;   JOINT REPLACEMENT     total knee   LOWER EXTREMITY ANGIOGRAPHY Right 06/13/2023   Procedure: Lower Extremity Angiography;  Surgeon: Celso College, MD;  Location: [facility] INVASIVE CV LAB;  Service: Cardiovascular;  Laterality: Right;   RENAL ARTERY STENT     due to kidney stones   TRANSMETATARSAL AMPUTATION Right 06/14/2023   Procedure: AMPUTATION, FOOT, TRANSMETATARSAL;  Surgeon: Evertt Hoe, DPM;  Location: [facility] ORS;  Service: Orthopedics/Podiatry;  Laterality: Right;    Family History  Problem Relation Age of Onset   Diabetes Mother    Alcoholism Father    Diabetes Sister    Diabetes Sister    Arrhythmia Sister    Diabetes Sister     No Known Allergies     Latest Ref Rng & Units 07/01/2023    7:03 PM 06/25/2023    2:46 PM 06/19/2023    3:33 AM  CBC  WBC 4.0 - 10.5 K/uL 7.0  9.4  12.9   Hemoglobin 13.0 - 17.0 g/dL 82.9  56.2  9.8   Hematocrit 39.0 - 52.0 % 35.8  34.0  30.1   Platelets 150 - 400 K/uL 332  431  417        CMP     Component Value Date/Time   NA 134 (L) 07/01/2023 1903   K 4.3 07/01/2023 1903   CL 99 07/01/2023 1903   CO2 25 07/01/2023 1903   GLUCOSE 233 (H) 07/01/2023 1903   BUN 23 07/01/2023 1903   CREATININE 1.42 (H) 07/01/2023 1903   CALCIUM  9.0 07/01/2023 1903   PROT 8.6 (H) 06/25/2023 1446   ALBUMIN 2.8 (L) 06/25/2023 1446   AST 25 06/25/2023 1446   ALT 40 06/25/2023 1446   ALKPHOS 73 06/25/2023 1446   BILITOT 0.4  06/25/2023 1446   GFRNONAA 53 (L) 07/01/2023 1903     No results found.     Assessment & Plan:   1. Claudication of left lower extremity (HCC) (Primary) Patient presents today in clinic for postop follow-up from aortogram with selective right lower extremity angiogram with stent placement to the proximal right anterior tibial artery and stent placement to the distal right SFA and most proximal popliteal artery.  Patient has no complaints related to his right leg.  Says it feels good.  He did have a transmetatarsal amputation of the right foot while in the hospital after the angiogram.  He still continues to heal and see podiatry for this.  Otherwise no complaints to his right lower extremity.  However his left lower extremity on vascular arterial ultrasounds with ABIs today is compromised.  Today's ABI is 0.55 with TBI of 0.31.  He has monophasic pulses only.  Left lower extremity remains warm to touch but patient complains of pain upon any ambulation to his left lower extremity.  His leg often becomes tight if he stands for a period of time or walks.  Vascular surgery today recommends that the patient undergo a left lower extremity angiogram with possible intervention.  I discussed in detail today in clinic with his caretaker at his side the procedure, benefits, risk, and complications.  Both verbalized understanding wish to proceed.  He basically understands the same as the angiogram he had to his right leg prior to surgery which will now be doing to his left leg.  We will get this approved and then call the patient to schedule accordingly.  Both caretaker and patient are in agreement with the plan.  2. Primary hypertension Continue antihypertensive medications as already ordered, these medications have been reviewed and there are no changes at this time.  3. Type 2 diabetes mellitus with diabetic peripheral angiopathy and gangrene, with long-term current use of insulin  (HCC) Continue  hypoglycemic medications as already ordered, these medications have been reviewed and there are no changes at this time.  Hgb A1C to be monitored as already arranged by primary service   Current Outpatient Medications on File Prior to Visit  Medication Sig Dispense Refill   acetaminophen  (TYLENOL ) 325 MG tablet Take 650 mg by mouth every 6 (six) hours as needed for mild pain, headache, fever or moderate pain.     amoxicillin -clavulanate (AUGMENTIN ) 875-125 MG tablet Take 1 tablet by mouth 2 (two) times daily. 60 tablet 1   ASPIRIN  81 PO Take by mouth daily.     atorvastatin  (LIPITOR) 40 MG tablet TAKE 1 TABLET BY MOUTH EVERY DAY *NEW PRESCRIPTION REQUEST* 90 tablet 1   carvedilol  (COREG ) 3.125 MG tablet TAKE ONE (1) TABLET BY MOUTH TWICE DAILY *NEW PRESCRIPTION REQUEST* 180 tablet 1   cholecalciferol (VITAMIN D) 25 MCG (1000 UT) tablet Take 1,000 Units by mouth daily.     clopidogrel  (PLAVIX ) 75 MG tablet Take 1 tablet (75 mg total) by mouth daily. 30 tablet 0   dapagliflozin  propanediol (FARXIGA ) 10 MG TABS tablet TAKE 1 TABLET BY MOUTH EVERY DAY BEFORE BREAKFAST *NEW PRESCRIPTION REQUEST* 90 tablet 1   Dulaglutide 1.5 MG/0.5ML SOPN Inject 1.5 mg into the skin once a week. Mondays     furosemide  (LASIX ) 40 MG tablet TAKE 1 TABLET BY MOUTH EVERY DAY *NEW PRESCRIPTION REQUEST* 90 tablet 1   glipiZIDE (GLUCOTROL) 10 MG tablet Take 10 mg by mouth daily before breakfast.     insulin  glargine (LANTUS ) 100 UNIT/ML Solostar Pen Inject 45 Units into the skin at bedtime. 15 mL 0   Niacin, Antihyperlipidemic, 500 MG TABS Take 500 mg by mouth daily with breakfast.     sacubitril -valsartan  (ENTRESTO ) 97-103 MG Take 1 tablet by mouth 2 (two) times daily. 180 tablet 1   spironolactone  (ALDACTONE ) 25 MG tablet Take 25 mg by mouth daily.     tadalafil (CIALIS) 5 MG tablet Take by mouth daily as needed for erectile dysfunction.     vitamin B-12 (CYANOCOBALAMIN) 500 MCG tablet Take 500 mcg by mouth daily.      No current facility-administered medications on file prior to visit.    There are no Patient Instructions on file for this visit. No follow-ups on file.   Annamaria Barrette, NP

## 2023-07-22 ENCOUNTER — Telehealth: Payer: Self-pay | Admitting: Podiatry

## 2023-07-22 NOTE — Telephone Encounter (Signed)
 [facility] needs to know if patient is still non weight bearing or has it been upgraded so they can do PT with him or not.  Please call [phone] opt 2  Tiffany

## 2023-07-23 LAB — VAS US ABI WITH/WO TBI
Left ABI: 0.55
Right ABI: 1.09

## 2023-07-23 NOTE — Telephone Encounter (Signed)
 Spoke with Mylinda Asa at [facility]. They need to know how long patient is non-WB. They will also need a verbal order to hold PT for that length of time.   Thank you.

## 2023-07-24 ENCOUNTER — Ambulatory Visit (INDEPENDENT_AMBULATORY_CARE_PROVIDER_SITE_OTHER): Admitting: Podiatry

## 2023-07-24 ENCOUNTER — Encounter: Payer: Self-pay | Admitting: Podiatry

## 2023-07-24 DIAGNOSIS — I96 Gangrene, not elsewhere classified: Secondary | ICD-10-CM

## 2023-07-25 ENCOUNTER — Telehealth: Payer: Self-pay | Admitting: Podiatry

## 2023-07-25 ENCOUNTER — Telehealth (INDEPENDENT_AMBULATORY_CARE_PROVIDER_SITE_OTHER): Payer: Self-pay

## 2023-07-25 NOTE — Telephone Encounter (Signed)
 Spoke with the patient along with the significant other and the patient has decided to call back to schedule as he has a debridement surgery coming up on 07/26/23 with another department.

## 2023-07-25 NOTE — Telephone Encounter (Signed)
  DOS 07/26/23  11044 DEBRIDEMENT OF BONE RT FOOT 15275 SKIN GRAFT APPLICATION 97605 WOUND VAC APPLICATION  [facility] EFFECTIVE 02/13/23 THRU 02/12/24  DEDUCTIBLE 0  OUT OF POCKET IS 3900.00 MET 2698.49  PER LOUIE AT [facility] M[facility] NO AUTH IS REQUIRE FOR THE CPT CODES LISTED ABOUT.   REF # [id number]

## 2023-07-25 NOTE — Progress Notes (Signed)
  Subjective:  Patient ID: Scott Hale, male    DOB: August 30, 1950,  MRN: [id number]  Chief Complaint  Patient presents with   Diabetic Ulcer    POV #1 DOS: 06/14/23: Transmetatarsal amputation, right foot It's good, it's healing.    73 y.o. male returns for post-op check.   Review of Systems: Negative except as noted in the HPI. Denies N/V/F/Ch.   Objective:  There were no vitals filed for this visit. There is no height or weight on file to calculate BMI. Constitutional Well developed. Well nourished.  Vascular Dorsal foot is warm to the midfoot, plantar flap is cooler Calf is soft and supple, no posterior calf or knee pain, negative Homans' sign  Neurologic Normal speech. Oriented to person, place, and time. Epicritic sensation to light touch grossly reduced bilaterally.  Dermatologic No improvement in healing as gangrenous changes in the plantar flap  Orthopedic: No pain to palpation noted about the surgical site.     Assessment:   1. Gangrene of right foot (HCC)     Plan:  Patient was evaluated and treated and all questions answered.  S/p foot surgery right - Still improvement local wound care.  Has gangrenous changes and dehiscence of the flap with necrosis.  This extends of the muscle layer.  I recommended debridement, we discussed the eventuality that this may progress eventually to a BKA if it does not heal.  We discussed the difficulty of his severe PAD and this.  I recommended debridement of the necrotic areas application of a skin substitute if possible and possibly revision midfoot amputation and wound VAC application.  All questions addressed.  Informed consent signed and reviewed.  Outpatient surgery the hospital be scheduled for this Friday.     No follow-ups on file.

## 2023-07-25 NOTE — Telephone Encounter (Signed)
 Spoke to significant other to confirm pt is scheduled for surgery tomorrow 6/13 at 230 at [facility].  She wanted to make sure you were aware that he does have an artificial knee on the right leg. She said you were going to trim back as much as possible but did mention a possible bka and she wanted to make sure you were aware.

## 2023-07-26 ENCOUNTER — Encounter: Payer: Self-pay | Admitting: Podiatry

## 2023-07-26 ENCOUNTER — Encounter
Admission: RE | Disposition: A | Discharge status: Home / Self Care | Payer: Self-pay | Source: Home / Self Care | Attending: Podiatry

## 2023-07-26 ENCOUNTER — Other Ambulatory Visit: Payer: Self-pay

## 2023-07-26 ENCOUNTER — Ambulatory Visit
Admission: RE | Admit: 2023-07-26 | Discharge: 2023-07-26 | Disposition: A | Discharge status: Home / Self Care | Attending: Podiatry | Admitting: Podiatry

## 2023-07-26 ENCOUNTER — Ambulatory Visit: Payer: Self-pay | Admitting: Urgent Care

## 2023-07-26 DIAGNOSIS — G4730 Sleep apnea, unspecified: Secondary | ICD-10-CM | POA: Diagnosis not present

## 2023-07-26 DIAGNOSIS — E1152 Type 2 diabetes mellitus with diabetic peripheral angiopathy with gangrene: Secondary | ICD-10-CM | POA: Diagnosis present

## 2023-07-26 DIAGNOSIS — L97514 Non-pressure chronic ulcer of other part of right foot with necrosis of bone: Secondary | ICD-10-CM | POA: Diagnosis not present

## 2023-07-26 DIAGNOSIS — Z951 Presence of aortocoronary bypass graft: Secondary | ICD-10-CM | POA: Diagnosis not present

## 2023-07-26 DIAGNOSIS — I96 Gangrene, not elsewhere classified: Secondary | ICD-10-CM

## 2023-07-26 DIAGNOSIS — I110 Hypertensive heart disease with heart failure: Secondary | ICD-10-CM | POA: Insufficient documentation

## 2023-07-26 DIAGNOSIS — I509 Heart failure, unspecified: Secondary | ICD-10-CM | POA: Diagnosis not present

## 2023-07-26 DIAGNOSIS — I10 Essential (primary) hypertension: Secondary | ICD-10-CM

## 2023-07-26 HISTORY — PX: APPLICATION OF WOUND VAC: SHX5189

## 2023-07-26 HISTORY — PX: ALLOGRAFT APPLICATION: SHX6404

## 2023-07-26 HISTORY — PX: WOUND DEBRIDEMENT: SHX247

## 2023-07-26 LAB — GLUCOSE, CAPILLARY
Glucose-Capillary: 172 mg/dL — ABNORMAL HIGH (ref 70–99)
Glucose-Capillary: 127 mg/dL — ABNORMAL HIGH (ref 70–99)

## 2023-07-26 SURGERY — DEBRIDEMENT, WOUND
Anesthesia: General | Laterality: Right

## 2023-07-26 MED ORDER — PROPOFOL 10 MG/ML IV BOLUS
INTRAVENOUS | Status: AC
  Filled 2023-07-26: qty 20

## 2023-07-26 MED ORDER — FENTANYL CITRATE (PF) 100 MCG/2ML IJ SOLN: 25.0000 ug | INTRAMUSCULAR | Status: DC | PRN

## 2023-07-26 MED ORDER — GLYCOPYRROLATE 0.2 MG/ML IJ SOLN
INTRAMUSCULAR | Status: AC
  Filled 2023-07-26: qty 1

## 2023-07-26 MED ORDER — BUPIVACAINE HCL 0.5 % IJ SOLN
INTRAMUSCULAR | Status: DC | PRN
  Administered 2023-07-26: 10 mL

## 2023-07-26 MED ORDER — 0.9 % SODIUM CHLORIDE (POUR BTL) OPTIME
TOPICAL | Status: DC | PRN
  Administered 2023-07-26: 500 mL

## 2023-07-26 MED ORDER — CHLORHEXIDINE GLUCONATE 0.12 % MT SOLN
15.0000 mL | Freq: Once | OROMUCOSAL | Status: AC
  Administered 2023-07-26: 15 mL via OROMUCOSAL

## 2023-07-26 MED ORDER — ONDANSETRON HCL 4 MG/2ML IJ SOLN
INTRAMUSCULAR | Status: AC
  Filled 2023-07-26: qty 2

## 2023-07-26 MED ORDER — FENTANYL CITRATE (PF) 100 MCG/2ML IJ SOLN
INTRAMUSCULAR | Status: AC
Start: 2023-07-26 — End: 2023-07-26
  Filled 2023-07-26: qty 2

## 2023-07-26 MED ORDER — PROPOFOL 10 MG/ML IV BOLUS
INTRAVENOUS | Status: DC | PRN
  Administered 2023-07-26: 30 mg via INTRAVENOUS

## 2023-07-26 MED ORDER — SODIUM CHLORIDE 0.9 % IV SOLN: INTRAVENOUS | Status: DC

## 2023-07-26 MED ORDER — CHLORHEXIDINE GLUCONATE 0.12 % MT SOLN
OROMUCOSAL | Status: AC
  Filled 2023-07-26: qty 15

## 2023-07-26 MED ORDER — OXYCODONE HCL 5 MG/5ML PO SOLN: 5.0000 mg | Freq: Once | ORAL | Status: DC | PRN

## 2023-07-26 MED ORDER — GLYCOPYRROLATE 0.2 MG/ML IJ SOLN
INTRAMUSCULAR | Status: DC | PRN
  Administered 2023-07-26: .2 mg via INTRAVENOUS

## 2023-07-26 MED ORDER — CEFAZOLIN SODIUM-DEXTROSE 2-4 GM/100ML-% IV SOLN
INTRAVENOUS | Status: AC
  Filled 2023-07-26: qty 100

## 2023-07-26 MED ORDER — BUPIVACAINE HCL (PF) 0.5 % IJ SOLN
INTRAMUSCULAR | Status: AC
  Filled 2023-07-26: qty 30

## 2023-07-26 MED ORDER — LIDOCAINE 2% (20 MG/ML) 5 ML SYRINGE
INTRAMUSCULAR | Status: DC | PRN
  Administered 2023-07-26: 50 mg via INTRAVENOUS

## 2023-07-26 MED ORDER — FENTANYL CITRATE (PF) 100 MCG/2ML IJ SOLN
INTRAMUSCULAR | Status: DC | PRN
  Administered 2023-07-26 (×2): 50 ug via INTRAVENOUS

## 2023-07-26 MED ORDER — PROPOFOL 500 MG/50ML IV EMUL
INTRAVENOUS | Status: DC | PRN
  Administered 2023-07-26: 50 ug/kg/min via INTRAVENOUS

## 2023-07-26 MED ORDER — LIDOCAINE HCL (PF) 2 % IJ SOLN
INTRAMUSCULAR | Status: AC
  Filled 2023-07-26: qty 5

## 2023-07-26 MED ORDER — ONDANSETRON HCL 4 MG/2ML IJ SOLN
INTRAMUSCULAR | Status: DC | PRN
  Administered 2023-07-26: 4 mg via INTRAVENOUS

## 2023-07-26 MED ORDER — ORAL CARE MOUTH RINSE: 15.0000 mL | Freq: Once | OROMUCOSAL | Status: AC

## 2023-07-26 MED ORDER — CEFAZOLIN SODIUM-DEXTROSE 2-4 GM/100ML-% IV SOLN
2.0000 g | INTRAVENOUS | Status: AC
Start: 2023-07-27 — End: 2023-07-26

## 2023-07-26 MED ORDER — OXYCODONE HCL 5 MG PO TABS: 5.0000 mg | ORAL_TABLET | Freq: Once | ORAL | Status: DC | PRN

## 2023-07-26 SURGICAL SUPPLY — 34 items
BLADE MED AGGRESSIVE (BLADE) IMPLANT
BLADE OSC/SAGITTAL MD 5.5X18 (BLADE) IMPLANT
BLADE SURG 15 STRL LF DISP TIS (BLADE) IMPLANT
BLADE SURG MINI STRL (BLADE) IMPLANT
BNDG ESMARCH 4X12 STRL LF (GAUZE/BANDAGES/DRESSINGS) ×1 IMPLANT
BNDG GAUZE DERMACEA FLUFF 4 (GAUZE/BANDAGES/DRESSINGS) ×1 IMPLANT
CNTNR URN SCR LID CUP LEK RST (MISCELLANEOUS) IMPLANT
CUFF TOURN SGL QUICK 18X4 (TOURNIQUET CUFF) IMPLANT
DRSG EMULSION OIL 3X8 NADH (GAUZE/BANDAGES/DRESSINGS) IMPLANT
DRSG VAC GRANUFOAM MED (GAUZE/BANDAGES/DRESSINGS) IMPLANT
ELECTRODE REM PT RTRN 9FT ADLT (ELECTROSURGICAL) ×1 IMPLANT
GAUZE SPONGE 4X4 12PLY STRL (GAUZE/BANDAGES/DRESSINGS) ×2 IMPLANT
GLOVE PI ORTHO PRO STRL 7.5 (GLOVE) ×1 IMPLANT
GOWN STRL REUS W/ TWL LRG LVL3 (GOWN DISPOSABLE) ×2 IMPLANT
GRAFT SKIN WND OMEGA3 SB 7X10 (Tissue) IMPLANT
KIT PREVENA INCISION MGT 13 (CANNISTER) IMPLANT
KIT TURNOVER KIT A (KITS) ×1 IMPLANT
LABEL OR SOLS (LABEL) ×1 IMPLANT
MANIFOLD NEPTUNE II (INSTRUMENTS) ×1 IMPLANT
NDL BIOPSY JAMSHIDI 11X6 (NEEDLE) IMPLANT
NDL HYPO 25X1 1.5 SAFETY (NEEDLE) ×2 IMPLANT
NEEDLE BIOPSY JAMSHIDI 11X6 (NEEDLE) IMPLANT
NEEDLE HYPO 25X1 1.5 SAFETY (NEEDLE) ×2 IMPLANT
NS IRRIG 500ML POUR BTL (IV SOLUTION) ×1 IMPLANT
PACK EXTREMITY ARMC (MISCELLANEOUS) ×1 IMPLANT
PAD ABD DERMACEA PRESS 5X9 (GAUZE/BANDAGES/DRESSINGS) IMPLANT
PENCIL SMOKE EVACUATOR (MISCELLANEOUS) ×2 IMPLANT
SOLUTION PREP PVP 2OZ (MISCELLANEOUS) ×1 IMPLANT
STAPLER SKIN PROX 35W (STAPLE) IMPLANT
SUTURE EHLN 3-0 FS-10 30 BLK (SUTURE) ×1 IMPLANT
SWAB CULTURE AMIES ANAERIB BLU (MISCELLANEOUS) IMPLANT
SYR 10ML LL (SYRINGE) ×1 IMPLANT
TRAP FLUID SMOKE EVACUATOR (MISCELLANEOUS) ×1 IMPLANT
WATER STERILE IRR 500ML POUR (IV SOLUTION) ×1 IMPLANT

## 2023-07-26 NOTE — Transfer of Care (Signed)
 Immediate Anesthesia Transfer of Care Note  Patient: Scott Hale  Procedure(s) Performed: DEBRIDEMENT, WOUND (Right) APPLICATION, ALLOGRAFT, SKIN (Right) APPLICATION, WOUND VAC (Right)  Patient Location: PACU  Anesthesia Type:General  Level of Consciousness: awake, alert , and oriented  Airway & Oxygen Therapy: Patient Spontanous Breathing  Post-op Assessment: Report given to RN and Post -op Vital signs reviewed and stable  Post vital signs: Reviewed  Last Vitals:  Vitals Value Taken Time  BP 106/72   Temp    Pulse 65 07/26/23 16:51  Resp 10 07/26/23 16:51  SpO2 100 % 07/26/23 16:51  Vitals shown include unfiled device data.  Last Pain:  Vitals:   07/26/23 1326  TempSrc: Temporal  PainSc: 0-No pain         Complications: No notable events documented.

## 2023-07-26 NOTE — Anesthesia Preprocedure Evaluation (Signed)
 Anesthesia Evaluation  Patient identified by MRN, date of birth, ID band Patient awake    Reviewed: Allergy & Precautions, NPO status , Patient's Chart, lab work & pertinent test results  History of Anesthesia Complications Negative for: history of anesthetic complications  Airway Mallampati: III  TM Distance: >3 FB Neck ROM: full    Dental  (+) Chipped, Poor Dentition, Implants   Pulmonary neg shortness of breath, sleep apnea    Pulmonary exam normal breath sounds clear to auscultation       Cardiovascular Exercise Tolerance: Good hypertension, (-) angina + CABG, + Peripheral Vascular Disease and +CHF  (-) Past MI Normal cardiovascular exam Rhythm:Regular Rate:Normal     Neuro/Psych negative neurological ROS  negative psych ROS   GI/Hepatic negative GI ROS, Neg liver ROS,neg GERD  ,,  Endo/Other  diabetes, Type 2  Unsure of last trulicity dose. Denies GI symptoms today  Renal/GU      Musculoskeletal   Abdominal   Peds  Hematology negative hematology ROS (+)   Anesthesia Other Findings Past Medical History: No date: Arthritis No date: CHF (congestive heart failure) (HCC) No date: Diabetes mellitus without complication (HCC) No date: Erectile dysfunction No date: History of kidney stones No date: Hypertension No date: Sleep apnea  Past Surgical History: No date: CARDIAC CATHETERIZATION 03/07/2021: COLONOSCOPY; N/A     Comment:  Procedure: COLONOSCOPY;  Surgeon: [name],               MD;  Location: [facility] ENDOSCOPY;  Service: Endoscopy;                Laterality: N/A;  IDDM 04/09/2017: COLONOSCOPY WITH PROPOFOL ; N/A     Comment:  Procedure: COLONOSCOPY WITH PROPOFOL ;  Surgeon: [name], MD;  Location: [facility] ENDOSCOPY;  Service:               Gastroenterology;  Laterality: N/A; 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 No date: JOINT REPLACEMENT     Comment:  total knee 06/13/2023: LOWER  EXTREMITY ANGIOGRAPHY; Right     Comment:  Procedure: Lower Extremity Angiography;  Surgeon: Celso College, MD;  Location: [facility] INVASIVE CV LAB;  Service:               Cardiovascular;  Laterality: Right; No date: RENAL ARTERY STENT     Comment:  due to kidney stones  BMI    Body Mass Index: 32.29 kg/m      Reproductive/Obstetrics negative OB ROS                             Anesthesia Physical Anesthesia Plan  ASA: 3  Anesthesia Plan: General   Post-op Pain Management: Minimal or no pain anticipated   Induction: Intravenous  PONV Risk Score and Plan: 3 and Propofol  infusion, TIVA and Ondansetron   Airway Management Planned: Nasal Cannula  Additional Equipment: None  Intra-op Plan:   Post-operative Plan:   Informed Consent: I have reviewed the patients History and Physical, chart, labs and discussed the procedure including the risks, benefits and alternatives for the proposed anesthesia with the patient or authorized representative who has indicated his/her understanding and acceptance.     Dental advisory given  Plan Discussed with: CRNA and Surgeon  Anesthesia Plan Comments: (Discussed risks of anesthesia with  patient, including possibility of difficulty with spontaneous ventilation under anesthesia necessitating airway intervention, PONV, and rare risks such as cardiac or respiratory or neurological events, and allergic reactions. Discussed the role of CRNA in patient's perioperative care. Patient understands.)       Anesthesia Quick Evaluation

## 2023-07-26 NOTE — Brief Op Note (Signed)
 07/26/2023  5:01 PM  PATIENT:  Scott Hale  73 y.o. male  PRE-OPERATIVE DIAGNOSIS:  Gangrene right foot  POST-OPERATIVE DIAGNOSIS:  Gangrene right foot  PROCEDURE:  Procedure(s): DEBRIDEMENT, WOUND (Right) APPLICATION, ALLOGRAFT, SKIN (Right) APPLICATION, WOUND VAC (Right) 12 cm x 6 cm  SURGEON:  Surgeons and Role:    * Shaiann Mcmanamon, Olive Better, DPM - Primary  PHYSICIAN ASSISTANT:   ASSISTANTS: none   ANESTHESIA:   local and MAC  EBL: 10 cc    DRAINS: Prevena wound VAC  LOCAL MEDICATIONS USED:  MARCAINE     and Amount: 12 ml  SPECIMEN: Pre and postdebridement cultures, first metatarsal path and micro  DISPOSITION OF SPECIMEN:  PATHOLOGY  COUNTS:  YES  TOURNIQUET:  * No tourniquets in log *  DICTATION: .Note written in EPIC  PLAN OF CARE: Discharge to home after PACU  PATIENT DISPOSITION:  PACU - hemodynamically stable.   Delay start of Pharmacological VTE agent (>24hrs) due to surgical blood loss or risk of bleeding: no

## 2023-07-26 NOTE — H&P (Signed)
 History and Physical Interval Note:  07/26/2023 3:40 PM  Scott Hale  has presented today for surgery, with the diagnosis of right foot gangrene.  The various methods of treatment have been discussed with the patient and family. After consideration of risks, benefits and other options for treatment, the patient has consented to   Procedure(s): DEBRIDEMENT, WOUND (Right) APPLICATION, ALLOGRAFT, SKIN (Right) APPLICATION, WOUND VAC (Right) as a surgical intervention.  The patient's history has been reviewed, patient examined, no change in status, stable for surgery.  I have reviewed the patient's chart and labs.  He was seen by his PCP on 07/23/23. No contraindications to proceeding. Questions were answered to the patient's satisfaction.     Floyce Hutching

## 2023-07-26 NOTE — Discharge Instructions (Addendum)
 Post-Surgery Instructions  1. If you are recuperating from surgery anywhere other than home, please be sure to leave us  a number where you can be reached. 2. Go directly home and rest. 3. The keep operated foot (or feet) elevated six inches above the hip when sitting or lying down. 4. Support the elevated foot and leg with pillows under the calf. DO NOT PLACE PILLOWS UNDER THE KNEE. 5. DO NOT REMOVE or get your bandages wet. This will increase your chances of getting an infection. 6.  Do not change the dressing  7. A limited amount of pain and swelling may occur. The skin may take on a bruised appearance. This is no cause for alarm. 8. For slight pain and swelling, apply an ice pack directly over the bandage for 15 minutes every hour. Continue icing until seen in the office. DO NOT apply any form of heat to the area. 9. Have prescription(s) filled immediately and take as directed. 10. Drink lots of liquids, water, and juice. 11. CALL THE OFFICE IMMEDIATELY IF: a. Bleeding continues b. Pain increases and/or does not respond to medication c. Bandage or cast appears too tight d. Any liquids (water, coffee, etc.) have spilled on your bandages. e. Tripping, falling, or stubbing the surgical foot f. If your temperature rises above 101 g. If you have ANY questions at all 12. Please use the crutches, knee scooter, or walker you have prescribed, rented, or purchased. If you are non-weight bearing DO NOT put weight on the operated foot for _________ days. If you are weight-bearing, follow your physician's instructions. You are expected to be: ? weight-bearing ? non-weight bearing 13. Special Instructions: If you have errors with the wound VAC, please call the number on the device for troubleshooting.  Do not remove the device.  We will order a more long-term unit next week to be delivered to your house.  The home nurse does not need to come change it Monday, their next visit can be Friday.   Continue taking your antibiotics.  Continue taking your Plavix .  Discuss with your PCP if you should be taking your Trulicity each week  14. Your next appointment is:  07/31/2023 1:30 PM     If you need to reach the nurse for any reason, please call: [place]: [phone] [place]: [phone] [place]: [phone]

## 2023-07-27 NOTE — Anesthesia Postprocedure Evaluation (Signed)
 Anesthesia Post Note  Patient: Scott Hale  Procedure(s) Performed: DEBRIDEMENT, WOUND (Right) APPLICATION, ALLOGRAFT, SKIN (Right) APPLICATION, WOUND VAC (Right)  Patient location during evaluation: PACU Anesthesia Type: General Level of consciousness: awake and alert Pain management: pain level controlled Vital Signs Assessment: post-procedure vital signs reviewed and stable Respiratory status: spontaneous breathing, nonlabored ventilation, respiratory function stable and patient connected to nasal cannula oxygen Cardiovascular status: blood pressure returned to baseline and stable Postop Assessment: no apparent nausea or vomiting Anesthetic complications: no   No notable events documented.   Last Vitals:  Vitals:   07/26/23 1730 07/26/23 1735  BP: 126/68   Pulse: 62 62  Resp: 13 18  Temp: (!) 36.2 C 36.7 C  SpO2: 100% 98%    Last Pain:  Vitals:   07/26/23 1735  TempSrc: Temporal  PainSc: 0-No pain                 Portia Brittle Erinn Mendosa

## 2023-07-28 DIAGNOSIS — L97514 Non-pressure chronic ulcer of other part of right foot with necrosis of bone: Secondary | ICD-10-CM

## 2023-07-28 NOTE — Op Note (Signed)
 Patient Name: Scott Hale DOB: 05/01/1950  MRN: [id number]   Date of Service: 07/26/2023  Surgeon: Dr. Jeni Mitten, DPM Assistants: None Pre-operative Diagnosis:  Gangrene right foot Post-operative Diagnosis:  Gangrene right foot Procedures: Excisional debridement to bone 72 cm Application skin substitute 72 cm Negative pressure wound therapy 72 cm Pathology/Specimens: ID Type Source Tests Collected by Time Destination  1 : first metatarsal bone biopsy Tissue Path Tissue SURGICAL PATHOLOGY Floyce Hutching, DPM 07/26/2023 1623   A : first metatarsal bone Tissue Wound AEROBIC/ANAEROBIC CULTURE W GRAM STAIN (SURGICAL/DEEP WOUND) Floyce Hutching, DPM 07/26/2023 1631   B : prewashout right foot Wound Wound AEROBIC CULTURE W GRAM STAIN (SUPERFICIAL SPECIMEN) Floyce Hutching, DPM 07/26/2023 1634   C : post washout right foot Wound Wound AEROBIC/ANAEROBIC CULTURE W GRAM STAIN (SURGICAL/DEEP WOUND) Floyce Hutching, DPM 07/26/2023 1641    Anesthesia: MAC with local Hemostasis: * No tourniquets in log * Estimated Blood Loss: 10 cc Materials:  Implant Name Type Inv. Item Serial No. Manufacturer Lot No. LRB No. Used Action  GRAFT SKIN WND [id number] SB [id number] - [id number] Tissue GRAFT SKIN WND [id number] SB [id number]  [employer] [id number] Right 1 Implanted   Medications: 12 cc 0.5% Marcaine  plain Complications: No complications  Indications for Procedure:  This is a 73 y.o. 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  He had poor wound healing in the postoperative setting and developed gangrenous changes on the plantar portions and dorsal portions of the flap at the incision margin.  This did not improve with 4 weeks of local wound care.  He presents today for debridement and skin substitute application for attempted limb salvage.   Procedure in Detail: Patient was identified in pre-operative holding  area. Formal consent was signed and the right lower extremity was marked. Patient was brought back to the operating room. Anesthesia was induced. The extremity was prepped and draped in the usual sterile fashion. Timeout was taken to confirm patient name, laterality, and procedure prior to incision.   Attention was then directed to the right foot where the previous TMA site had a large amount of necrosis on the superficial portions of the flap.  I began with full-thickness excisional debridement of the eschar and nonviable tissue and necrosis.  This was completed with scalpel rongeur and scissors.  On the dorsal portion of the incision this was relatively superficial and once the area was debrided into the subcutaneous layer there was adequate bleeding noted with no further necrosis.  On the plantar portion of the flap especially in the medial portion this necrosis extended deeper to the level of the first metatarsal.  There is no gross purulence but fatty necrosis and thrombosis of the superficial and deep veins.  I completed the full-thickness excisional debridement down to the level of the 1st and 2nd metatarsals which were exposed within the wound.  Postdebridement culture was taken of the soft tissues and bone from the first metatarsal were taken for bone culture and pathology.  The wound was then thoroughly irrigated.  Post lavage culture was taken.  At this point the open wound measured 12 cm x 6 cm x 2 cm.  A Kerecis skin substitute was selected inset into the wound and secured with staples.  It was cut to fit to utilize all of the skin substitute and cover the entirety of the wound bed which utilized the entirety of the graft,  no units were wasted.  An Adaptic nonadherent layer was then placed over this cut to fit and stapled to the skin.  A negative pressure wound therapy VAC was then selected cut to fit and applied and will remain in place for 5 days.    The foot was then dressed with an Ace wrap  under no compression. Patient tolerated the procedure well.   Disposition: Following a period of post-operative monitoring, patient will be transferred to home.

## 2023-07-29 ENCOUNTER — Encounter: Payer: Self-pay | Admitting: Podiatry

## 2023-07-29 ENCOUNTER — Telehealth (INDEPENDENT_AMBULATORY_CARE_PROVIDER_SITE_OTHER): Payer: Self-pay

## 2023-07-29 ENCOUNTER — Telehealth: Payer: Self-pay

## 2023-07-29 LAB — AEROBIC CULTURE W GRAM STAIN (SUPERFICIAL SPECIMEN)

## 2023-07-29 NOTE — Telephone Encounter (Signed)
 That sounds good. Thanks all

## 2023-07-29 NOTE — Telephone Encounter (Signed)
 Patient had trouble with the disposable wound vac that Dr. Luster Salters put on during surgery on Thursday. I spoke to [place] at length this morning - wound vac is working ok now - they had called the manufacturer on Friday and the rep talked them through resetting the pump. It has been working fine since then. Yesterday it did beep that the canister  was full (when it was not full). They removed the canister and put it back on and that seemed to reset it. Advised to please call back with any questions or problems -they will keep his appointment for Wednesday. Thanks

## 2023-07-29 NOTE — Telephone Encounter (Signed)
 Spoke with the patient's significant other and he is scheduled with Dr. Vonna Guardian on 08/01/23 with a 10:00 am arrival time to the [facility] for a LLE angio. Pre-procedure instructions were discussed and will be mailed.

## 2023-07-30 LAB — SURGICAL PATHOLOGY

## 2023-07-31 ENCOUNTER — Ambulatory Visit (INDEPENDENT_AMBULATORY_CARE_PROVIDER_SITE_OTHER): Admitting: Podiatry

## 2023-07-31 ENCOUNTER — Encounter: Payer: Self-pay | Admitting: Podiatry

## 2023-07-31 VITALS — BP 147/85 | HR 71 | Resp 20 | Ht 72.0 in | Wt 229.9 lb

## 2023-07-31 DIAGNOSIS — L97514 Non-pressure chronic ulcer of other part of right foot with necrosis of bone: Secondary | ICD-10-CM

## 2023-07-31 NOTE — Patient Instructions (Addendum)
 For home nurse: On Friday, 08/02/2023 do not apply wound VAC, dress periwound and graft site with Betadine, apply Adaptic to graft site and saline moistened wet-to-dry dressing.  Start wound VAC therapy on Monday, 08/05/2023

## 2023-07-31 NOTE — Progress Notes (Signed)
  Subjective:  Patient ID: Scott Hale, male    DOB: 12/20/1950,  MRN: [id number]  Chief Complaint  Patient presents with   Routine Post Op    POV #1 DOS: 06/14/23: Transmetatarsal amputation, right foot pt states everything has gone well just nervous to see how his foot looks, and states his wound pump stop working.    DOS: 07/26/2023 Procedure: Right foot debridement and Kerecis application with Prevena wound VAC  73 y.o. male returns for post-op check.  He got the wound VAC working as noted in previous telephone encounter, they said yesterday morning he had malfunction again and stopped working in the canister had filled  Review of Systems: Negative except as noted in the HPI. Denies N/V/F/Ch.   Objective:   Vitals:   07/31/23 1318  BP: (!) 147/85  Pulse: 71  Resp: 20  SpO2: 98%   Body mass index is 31.19 kg/m. Constitutional Well developed. Well nourished.  Vascular Foot warm and well perfused.  Calf is soft and supple, no posterior calf or knee pain, negative Homans' sign  Neurologic Normal speech. Oriented to person, place, and time. Epicritic sensation to light touch grossly absent bilaterally.  Dermatologic Maceration at graft site and periwound.  No signs of acute infection  Orthopedic: No pain to palpation noted about the surgical site.    Assessment:   1. 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  S/p foot surgery right Prevena VAC malfunctioned again, because saturation of graft site but does not appear to be severely compromised.  I removed the VAC and the overlying Adaptic today and applied Betadine to the periwound.  His wound VAC Oran Bimler should be approved today and the home nurse can apply it on Monday.  She should change the dressing Friday, change Friday with Adaptic and saline wet-to-dry over this.  Return to see me in 2 weeks  Return in about 2 weeks (around  08/14/2023) for wound care.

## 2023-08-01 ENCOUNTER — Ambulatory Visit
Admission: RE | Admit: 2023-08-01 | Discharge: 2023-08-01 | Disposition: A | Discharge status: Home / Self Care | Attending: Vascular Surgery | Admitting: Vascular Surgery

## 2023-08-01 ENCOUNTER — Encounter
Admission: RE | Disposition: A | Discharge status: Home / Self Care | Payer: Self-pay | Source: Home / Self Care | Attending: Vascular Surgery

## 2023-08-01 DIAGNOSIS — Z794 Long term (current) use of insulin: Secondary | ICD-10-CM | POA: Insufficient documentation

## 2023-08-01 DIAGNOSIS — I509 Heart failure, unspecified: Secondary | ICD-10-CM | POA: Insufficient documentation

## 2023-08-01 DIAGNOSIS — E1151 Type 2 diabetes mellitus with diabetic peripheral angiopathy without gangrene: Secondary | ICD-10-CM | POA: Insufficient documentation

## 2023-08-01 DIAGNOSIS — I110 Hypertensive heart disease with heart failure: Secondary | ICD-10-CM | POA: Diagnosis not present

## 2023-08-01 DIAGNOSIS — Z7985 Long-term (current) use of injectable non-insulin antidiabetic drugs: Secondary | ICD-10-CM | POA: Diagnosis not present

## 2023-08-01 DIAGNOSIS — Z7984 Long term (current) use of oral hypoglycemic drugs: Secondary | ICD-10-CM | POA: Insufficient documentation

## 2023-08-01 DIAGNOSIS — Z9889 Other specified postprocedural states: Secondary | ICD-10-CM | POA: Diagnosis not present

## 2023-08-01 DIAGNOSIS — I70212 Atherosclerosis of native arteries of extremities with intermittent claudication, left leg: Secondary | ICD-10-CM | POA: Diagnosis not present

## 2023-08-01 DIAGNOSIS — Z5941 Food insecurity: Secondary | ICD-10-CM | POA: Diagnosis not present

## 2023-08-01 DIAGNOSIS — Z5986 Financial insecurity: Secondary | ICD-10-CM | POA: Diagnosis not present

## 2023-08-01 DIAGNOSIS — I70229 Atherosclerosis of native arteries of extremities with rest pain, unspecified extremity: Secondary | ICD-10-CM

## 2023-08-01 HISTORY — PX: LOWER EXTREMITY ANGIOGRAPHY: CATH118251

## 2023-08-01 LAB — BUN: BUN: 19 mg/dL (ref 8–23)

## 2023-08-01 LAB — CREATININE, SERUM
Creatinine, Ser: 1.18 mg/dL (ref 0.61–1.24)
GFR, Estimated: >60 mL/min (ref >60)

## 2023-08-01 LAB — GLUCOSE, CAPILLARY: Glucose-Capillary: 98 mg/dL (ref 70–99)

## 2023-08-01 SURGERY — LOWER EXTREMITY ANGIOGRAPHY
Anesthesia: Moderate Sedation | Site: Leg Lower | Laterality: Left

## 2023-08-01 MED ORDER — MIDAZOLAM HCL 2 MG/ML PO SYRP: 8.0000 mg | ORAL_SOLUTION | Freq: Once | ORAL | Status: DC | PRN

## 2023-08-01 MED ORDER — FENTANYL CITRATE (PF) 100 MCG/2ML IJ SOLN
INTRAMUSCULAR | Status: AC
  Filled 2023-08-01: qty 2

## 2023-08-01 MED ORDER — ACETAMINOPHEN 325 MG PO TABS: 650.0000 mg | ORAL_TABLET | ORAL | Status: DC | PRN

## 2023-08-01 MED ORDER — HEPARIN (PORCINE) IN NACL 1000-0.9 UT/500ML-% IV SOLN
INTRAVENOUS | Status: DC | PRN
Start: 2023-08-01 — End: 2023-08-01
  Administered 2023-08-01: 1000 mL

## 2023-08-01 MED ORDER — IODIXANOL 320 MG/ML IV SOLN
INTRAVENOUS | Status: DC | PRN
  Administered 2023-08-01: 50 mL

## 2023-08-01 MED ORDER — FENTANYL CITRATE (PF) 100 MCG/2ML IJ SOLN
INTRAMUSCULAR | Status: DC | PRN
Start: 2023-08-01 — End: 2023-08-01
  Administered 2023-08-01: 50 ug via INTRAVENOUS
  Administered 2023-08-01: 25 ug via INTRAVENOUS

## 2023-08-01 MED ORDER — SODIUM CHLORIDE 0.9 % IV SOLN: INTRAVENOUS | Status: DC

## 2023-08-01 MED ORDER — SODIUM CHLORIDE 0.9 % IV SOLN: 250.0000 mL | INTRAVENOUS | Status: DC | PRN

## 2023-08-01 MED ORDER — CEFAZOLIN SODIUM-DEXTROSE 2-4 GM/100ML-% IV SOLN: 2.0000 g | INTRAVENOUS | Status: DC

## 2023-08-01 MED ORDER — LABETALOL HCL 5 MG/ML IV SOLN: 10.0000 mg | INTRAVENOUS | Status: DC | PRN

## 2023-08-01 MED ORDER — ASPIRIN 81 MG PO TBEC: 81.0000 mg | DELAYED_RELEASE_TABLET | Freq: Every day | ORAL | Status: DC

## 2023-08-01 MED ORDER — CEFAZOLIN SODIUM-DEXTROSE 2-4 GM/100ML-% IV SOLN
INTRAVENOUS | Status: AC
  Filled 2023-08-01: qty 100

## 2023-08-01 MED ORDER — LIDOCAINE-EPINEPHRINE (PF) 1 %-1:200000 IJ SOLN
INTRAMUSCULAR | Status: DC | PRN
  Administered 2023-08-01: 20 mL

## 2023-08-01 MED ORDER — MIDAZOLAM HCL 2 MG/2ML IJ SOLN
INTRAMUSCULAR | Status: DC | PRN
  Administered 2023-08-01: 1 mg via INTRAVENOUS
  Administered 2023-08-01: 2 mg via INTRAVENOUS

## 2023-08-01 MED ORDER — HEPARIN SODIUM (PORCINE) 1000 UNIT/ML IJ SOLN
INTRAMUSCULAR | Status: DC | PRN
  Administered 2023-08-01: 5000 [IU] via INTRAVENOUS

## 2023-08-01 MED ORDER — METHYLPREDNISOLONE SODIUM SUCC 125 MG IJ SOLR: 125.0000 mg | Freq: Once | INTRAMUSCULAR | Status: DC | PRN

## 2023-08-01 MED ORDER — OXYCODONE HCL 5 MG PO TABS
5.0000 mg | ORAL_TABLET | Freq: Once | ORAL | Status: AC
  Administered 2023-08-01: 5 mg via ORAL

## 2023-08-01 MED ORDER — HYDROMORPHONE HCL 1 MG/ML IJ SOLN: 1.0000 mg | Freq: Once | INTRAMUSCULAR | Status: DC | PRN

## 2023-08-01 MED ORDER — HYDRALAZINE HCL 20 MG/ML IJ SOLN: 5.0000 mg | INTRAMUSCULAR | Status: DC | PRN

## 2023-08-01 MED ORDER — SODIUM CHLORIDE 0.9% FLUSH: 3.0000 mL | INTRAVENOUS | Status: DC | PRN

## 2023-08-01 MED ORDER — ASPIRIN 81 MG PO TBEC: 81.0000 mg | DELAYED_RELEASE_TABLET | Freq: Every day | ORAL | 2 refills | Status: AC

## 2023-08-01 MED ORDER — HEPARIN SODIUM (PORCINE) 1000 UNIT/ML IJ SOLN
INTRAMUSCULAR | Status: AC
  Filled 2023-08-01: qty 10

## 2023-08-01 MED ORDER — OXYCODONE HCL 5 MG PO TABS
ORAL_TABLET | ORAL | Status: AC
  Filled 2023-08-01: qty 1

## 2023-08-01 MED ORDER — FAMOTIDINE 20 MG PO TABS
40.0000 mg | ORAL_TABLET | Freq: Once | ORAL | Status: DC | PRN
Start: 2023-08-01 — End: 2023-08-01

## 2023-08-01 MED ORDER — SODIUM CHLORIDE 0.9% FLUSH: 3.0000 mL | Freq: Two times a day (BID) | INTRAVENOUS | Status: DC

## 2023-08-01 MED ORDER — MIDAZOLAM HCL 5 MG/5ML IJ SOLN
INTRAMUSCULAR | Status: AC
  Filled 2023-08-01: qty 5

## 2023-08-01 MED ORDER — ONDANSETRON HCL 4 MG/2ML IJ SOLN: 4.0000 mg | Freq: Four times a day (QID) | INTRAMUSCULAR | Status: DC | PRN

## 2023-08-01 MED ORDER — DIPHENHYDRAMINE HCL 50 MG/ML IJ SOLN: 50.0000 mg | Freq: Once | INTRAMUSCULAR | Status: DC | PRN

## 2023-08-01 SURGICAL SUPPLY — 17 items
BALLOON JADE .014 3.5 X 40 (BALLOONS) IMPLANT
BALLOON JADE .014 4.0 X 40 0 (BALLOONS) IMPLANT
BALLOON ULTRVRSE 3.5X100X150 (BALLOONS) IMPLANT
CATH ANGIO 5F PIGTAIL 65CM (CATHETERS) IMPLANT
CATH BEACON 5 .038 100 VERT TP (CATHETERS) IMPLANT
COVER PROBE ULTRASOUND 5X96 (MISCELLANEOUS) IMPLANT
DEVICE PRESTO INFLATION (MISCELLANEOUS) IMPLANT
DEVICE STARCLOSE SE CLOSURE (Vascular Products) IMPLANT
GLIDEWIRE ADV .035X260CM (WIRE) IMPLANT
PACK ANGIOGRAPHY (CUSTOM PROCEDURE TRAY) ×1 IMPLANT
SHEATH BRITE TIP 5FRX11 (SHEATH) IMPLANT
SHEATH RAABE 6FRX70 (SHEATH) IMPLANT
STENT ESPRIT BTK 3.5X38 SCAFF (Permanent Stent) IMPLANT
SYR MEDRAD MARK 7 150ML (SYRINGE) IMPLANT
TUBING CONTRAST HIGH PRESS 72 (TUBING) IMPLANT
WIRE COMMAND ST ANG 014 300 (WIRE) IMPLANT
WIRE J 3MM .035X145CM (WIRE) IMPLANT

## 2023-08-01 NOTE — Op Note (Signed)
 [facility]  Percutaneous Study/Intervention Procedural Note   Date of Surgery: 08/01/2023  Surgeon(s):Renarda Mullinix    Assistants:none  Pre-operative Diagnosis: PAD with claudication LLE  Post-operative diagnosis:  Same  Procedure(s) Performed:             1.  Ultrasound guidance for vascular access right femoral artery             2.  Catheter placement into left SFA from right femoral proximal             3.  Aortogram and selective left lower extremity angiogram             4.  Stent placement to the left popliteal artery with 3.5 mm diameter by 38 mm length Esprit scaffolding system postdilated to 4 mm             5.  Stent placement to the left peroneal artery with 3.5 mm diameter by 38 mm length Esprit scaffolding system  6.  StarClose closure device right femoral artery  EBL: 5 cc  Contrast: 60 cc  Fluoro Time: 5.5 minutes  Moderate Conscious Sedation Time: approximately 50 minutes using 3 mg of Versed  and 75 mcg of Fentanyl               Indications:  Patient is a 73 y.o.male with claudication symptoms of the left lower extremity.  He has already undergone right lower extremity revascularization for limb salvage. The patient has noninvasive study showing significant reduction in the left ABI. The patient is brought in for angiography for further evaluation and potential treatment.  Due to the limb threatening nature of the situation, angiogram was performed for attempted limb salvage. The patient is aware that if the procedure fails, amputation would be expected.  The patient also understands that even with successful revascularization, amputation may still be required due to the severity of the situation.  Risks and benefits are discussed and informed consent is obtained.   Procedure:  The patient was identified and appropriate procedural time out was performed.  The patient was then placed supine on the table and prepped and draped in the usual sterile  fashion. Moderate conscious sedation was administered during a face to face encounter with the patient throughout the procedure with my supervision of the RN administering medicines and monitoring the patient's vital signs, pulse oximetry, telemetry and mental status throughout from the start of the procedure until the patient was taken to the recovery room. Ultrasound was used to evaluate the right common femoral artery.  It was patent .  A digital ultrasound image was acquired.  A Seldinger needle was used to access the right common femoral artery under direct ultrasound guidance and a permanent image was performed.  A 0.035 J wire was advanced without resistance and a 5Fr sheath was placed.  Pigtail catheter was placed into the aorta and an AP aortogram was performed. This demonstrated normal renal arteries and normal aorta and iliac segments without significant stenosis. I then crossed the aortic bifurcation and advanced to the left femoral head. Selective left lower extremity angiogram was then performed. This demonstrated fairly normal common femoral artery, profunda femoris artery, and superficial femoral artery with only mild disease.  The distal popliteal artery had a string sign greater than 90% stenosis with some poststenotic dilatation.  There was a true trifurcation without a clear tibioperoneal trunk.  The peroneal artery was really the only runoff distally and it had greater than 75% stenosis in  its proximal segment. It was felt that it was in the patient's best interest to proceed with intervention after these images to avoid a second procedure and a larger amount of contrast and fluoroscopy based off of the findings from the initial angiogram. The patient was systemically heparinized and a 6 French 70 cm sheath was then placed over the Terumo Advantage wire. I then used a Kumpe catheter and the advantage wire to get down into the popliteal artery and then I exchanged for a 0.014 command wire to  cross both the popliteal and the peroneal stenoses without difficulty.  I pretreated the lesions with 3-1/2 mm diameter angioplasty balloon in both locations.  I then placed a 3.5 mm diameter by 38 mm length Esprit stent in the proximal peroneal artery.  This was postdilated with a 3-1/2 mm balloon.  Superior to this in the distal popliteal artery, a 3.5 mm diameter by 38 mm length Esprit stent was deployed in the distal left popliteal artery and postdilated with a 4 mm diameter balloon.  Completion imaging showed marked improvement after stent placement both locations with less than 10% residual stenosis remaining. I elected to terminate the procedure. The sheath was removed and StarClose closure device was deployed in the right femoral artery with excellent hemostatic result. The patient was taken to the recovery room in stable condition having tolerated the procedure well.  Findings:               Aortogram:  This demonstrated normal renal arteries and normal aorta and iliac segments without significant stenosis.              Left Lower Extremity:  This demonstrated fairly normal common femoral artery, profunda femoris artery, and superficial femoral artery with only mild disease.  The distal popliteal artery had a string sign greater than 90% stenosis with some poststenotic dilatation.  There was a true trifurcation without a clear tibioperoneal trunk.  The peroneal artery was really the only runoff distally and it had greater than 75% stenosis in its proximal segment.   Disposition: Patient was taken to the recovery room in stable condition having tolerated the procedure well.  Complications: None  Mikki Alexander 08/01/2023 1:58 PM   This note was created with Dragon Medical transcription system. Any errors in dictation are purely unintentional.

## 2023-08-01 NOTE — Interval H&P Note (Signed)
 History and Physical Interval Note:  08/01/2023 9:29 AM  Scott Hale  has presented today for surgery, with the diagnosis of LLE Angio   ASO w rest pain.  The various methods of treatment have been discussed with the patient and family. After consideration of risks, benefits and other options for treatment, the patient has consented to  Procedure(s): Lower Extremity Angiography (Left) as a surgical intervention.  The patient's history has been reviewed, patient examined, no change in status, stable for surgery.  I have reviewed the patient's chart and labs.  Questions were answered to the patient's satisfaction.     [name]

## 2023-08-02 ENCOUNTER — Encounter: Payer: Self-pay | Admitting: Vascular Surgery

## 2023-08-02 ENCOUNTER — Telehealth: Payer: Self-pay | Admitting: Podiatry

## 2023-08-02 LAB — AEROBIC/ANAEROBIC CULTURE W GRAM STAIN (SURGICAL/DEEP WOUND)
Gram Stain: NONE SEEN
Gram Stain: NONE SEEN

## 2023-08-02 NOTE — Telephone Encounter (Signed)
 [facility] wants to know if there are any new orders for wound care?  He has an appt with home health today at 4.  Please call [phone] opt 2.

## 2023-08-06 ENCOUNTER — Other Ambulatory Visit: Payer: Self-pay | Admitting: Family

## 2023-08-14 ENCOUNTER — Ambulatory Visit (INDEPENDENT_AMBULATORY_CARE_PROVIDER_SITE_OTHER): Admitting: Podiatry

## 2023-08-14 ENCOUNTER — Encounter: Payer: Self-pay | Admitting: Podiatry

## 2023-08-14 DIAGNOSIS — L97514 Non-pressure chronic ulcer of other part of right foot with necrosis of bone: Secondary | ICD-10-CM

## 2023-08-15 NOTE — Progress Notes (Signed)
  Subjective:  Patient ID: Scott Hale, male    DOB: 06/23/1950,  MRN: [id number]  Chief Complaint  Patient presents with   Routine Post Op    Rm6 POV # 2 DOS 07/26/23 RT FOOT DEBRIDMENT OF BONE, revision of mid foot amputaion w/ graft and wound vac/ patient says that he is doing well.    DOS: 07/26/2023 Procedure: Right foot debridement and Kerecis application with Prevena wound VAC  73 y.o. male returns for post-op check.  VAC therapy started on Monday, home nurse was concerned that the peel and place dressing is damaging the surrounding skin  Review of Systems: Negative except as noted in the HPI. Denies N/V/F/Ch.   Objective:   There were no vitals filed for this visit.  There is no height or weight on file to calculate BMI. Constitutional Well developed. Well nourished.  Vascular Foot warm and well perfused.  Calf is soft and supple, no posterior calf or knee pain, negative Homans' sign  Neurologic Normal speech. Oriented to person, place, and time. Epicritic sensation to light touch grossly absent bilaterally.  Dermatologic Graft integrating well  Orthopedic: No pain to palpation noted about the surgical site.    Assessment:   1. 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  S/p foot surgery right Healing well.  Staples removed.  Continue VAC therapy 125 mmHg continuous pressure change 3 times weekly.  Continue nonweightbearing.  Graft still integrating did not require debridement today.  Follow-up in 3 weeks to reevaluate.  Signed Return in about 2 weeks (around 08/28/2023) for wound care.

## 2023-08-21 ENCOUNTER — Telehealth: Payer: Self-pay

## 2023-08-21 NOTE — Telephone Encounter (Signed)
-----   Message from Juliene JONELLE Medicine sent at 08/15/2023  2:14 PM EDT ----- Updated wound care order is attached here if you can fax to [facility]

## 2023-08-21 NOTE — Telephone Encounter (Signed)
 Updated wound care orders and recent office note faxed to [facility] [phone]

## 2023-08-22 ENCOUNTER — Ambulatory Visit: Admitting: Infectious Diseases

## 2023-08-28 ENCOUNTER — Ambulatory Visit (INDEPENDENT_AMBULATORY_CARE_PROVIDER_SITE_OTHER): Admitting: Podiatry

## 2023-08-28 VITALS — Ht 72.0 in | Wt 230.0 lb

## 2023-08-28 DIAGNOSIS — L97514 Non-pressure chronic ulcer of other part of right foot with necrosis of bone: Secondary | ICD-10-CM

## 2023-08-28 NOTE — Progress Notes (Signed)
  Subjective:  Patient ID: Scott Hale, male    DOB: Jan 03, 1951,  MRN: [id number]  Chief Complaint  Patient presents with   Wound Check    Rm 3 Patient is here for wound care. Wound is open with active drainage after removing vac.    DOS: 07/26/2023 Procedure: Right foot debridement and Kerecis application with Prevena wound VAC  73 y.o. male returns for post-op check.    Review of Systems: Negative except as noted in the HPI. Denies N/V/F/Ch.   Objective:   There were no vitals filed for this visit.  Body mass index is 31.19 kg/m. Constitutional Well developed. Well nourished.  Vascular Foot warm and well perfused.  Calf is soft and supple, no posterior calf or knee pain, negative Homans' sign  Neurologic Normal speech. Oriented to person, place, and time. Epicritic sensation to light touch grossly absent bilaterally.  Dermatologic Graft integrating well.  Residual ulceration measures 11.0 x 4.0 x 0.6 cm.  Exposed subcutaneous tissue.  Fibrogranular wound bed.  No sign of infection.  Mild to moderate drainage  Orthopedic: No pain to palpation noted about the surgical site.    Assessment:   1. 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  S/p foot surgery right Healing well.  Wound was debrided today of nonviable tissue to the subcutaneous layer to remove fibrin and slough and surrounding hyperkeratosis to the subcutaneous layer.  Postdebridement measurements are noted above.  Total area of debridement 44 cm.  Today applied collagen particular powder which was moistened, Mepitel, silver alginate, ABD pad, Kerlix, Ace wrap.  Resume VAC therapy as scheduled on Friday  Return in about 3 weeks (around 09/18/2023) for wound care.

## 2023-08-30 NOTE — Telephone Encounter (Signed)
 Error

## 2023-09-03 ENCOUNTER — Other Ambulatory Visit (INDEPENDENT_AMBULATORY_CARE_PROVIDER_SITE_OTHER): Payer: Self-pay | Admitting: Vascular Surgery

## 2023-09-03 DIAGNOSIS — Z9889 Other specified postprocedural states: Secondary | ICD-10-CM

## 2023-09-04 ENCOUNTER — Encounter: Admitting: Podiatry

## 2023-09-05 ENCOUNTER — Other Ambulatory Visit (INDEPENDENT_AMBULATORY_CARE_PROVIDER_SITE_OTHER)

## 2023-09-05 ENCOUNTER — Ambulatory Visit (INDEPENDENT_AMBULATORY_CARE_PROVIDER_SITE_OTHER): Admitting: Nurse Practitioner

## 2023-09-05 ENCOUNTER — Encounter (INDEPENDENT_AMBULATORY_CARE_PROVIDER_SITE_OTHER): Payer: Self-pay | Admitting: Nurse Practitioner

## 2023-09-05 VITALS — BP 117/70 | HR 61

## 2023-09-05 DIAGNOSIS — Z9889 Other specified postprocedural states: Secondary | ICD-10-CM | POA: Diagnosis not present

## 2023-09-05 DIAGNOSIS — E1152 Type 2 diabetes mellitus with diabetic peripheral angiopathy with gangrene: Secondary | ICD-10-CM | POA: Diagnosis not present

## 2023-09-05 DIAGNOSIS — I739 Peripheral vascular disease, unspecified: Secondary | ICD-10-CM

## 2023-09-05 DIAGNOSIS — I10 Essential (primary) hypertension: Secondary | ICD-10-CM

## 2023-09-05 DIAGNOSIS — Z794 Long term (current) use of insulin: Secondary | ICD-10-CM

## 2023-09-05 NOTE — Progress Notes (Signed)
 Subjective:    Patient ID: Scott Hale, male    DOB: 11-06-1950, 73 y.o.   MRN: [id number] Chief Complaint  Patient presents with   Follow up with Delores Orvin BRAVO, NP (Vascular Surgery) in 4     The patient returns to the office for followup and review status post angiogram with intervention on 08/01/2023.   Procedure: Procedure(s) Performed:             1.  Ultrasound guidance for vascular access right femoral artery             2.  Catheter placement into left SFA from right femoral proximal             3.  Aortogram and selective left lower extremity angiogram             4.  Stent placement to the left popliteal artery with 3.5 mm diameter by 38 mm length Esprit scaffolding system postdilated to 4 mm             5.  Stent placement to the left peroneal artery with 3.5 mm diameter by 38 mm length Esprit scaffolding system             6.  StarClose closure device right femoral artery  .  Additionally he had intervention on the right lower extremity on 06/13/2023 including:  Procedure(s) Performed:             1.  Ultrasound guidance for vascular access left femoral artery             2.  Catheter placement into right common femoral artery from left femoral approach             3.  Aortogram and selective right lower extremity angiogram             4.  Percutaneous transluminal angioplasty of right anterior tibial artery with 3 mm diameter angioplasty balloon throughout and 3.5 mm diameter angioplasty balloon proximally             5.  Stent placement to the proximal right anterior tibial artery with 3.75 mm diameter by 38 mm length Esprit scaffolding system             6.  Angioplasty of the right popliteal artery and distal SFA with 5 mm diameter by 30 cm length Lutonix drug-coated angioplasty balloon             7.  Stent placement to the distal right SFA and most proximal popliteal artery with 7 mm diameter by 12 cm length life stent             8.  Angioplasty of the origin  of the right SFA with 5 mm diameter by 6 cm length Lutonix drug-coated angioplasty balloon             9.  StarClose closure device left femoral artery   The patient notes improvement in the lower extremity symptoms.  The patient is still healing a previous TMA on his right foot.  Based on recent angiogram as well as different changes to his wound care, his wound on the right foot is beginning to heal very well per the patient and his wife.  There have been no significant changes to the patient's overall health care.  No documented history of amaurosis fugax or recent TIA symptoms. There are no recent neurological changes noted. No documented history of DVT, PE or superficial thrombophlebitis. The  patient denies recent episodes of angina or shortness of breath.   ABI's Rt=1.11 and Lt=1.13  (previous ABI's Rt=1.09 and Lt=0.55) Duplex US  of the right tibia shows biphasic waveforms with multiphasic waveforms in the left.    Review of Systems  Skin:  Positive for wound.  Neurological:  Positive for weakness.  All other systems reviewed and are negative.      Objective:   Physical Exam Vitals reviewed.  HENT:     Head: Normocephalic.  Cardiovascular:     Rate and Rhythm: Normal rate.     Pulses:          Dorsalis pedis pulses are detected w/ Doppler on the right side and detected w/ Doppler on the left side.       Posterior tibial pulses are detected w/ Doppler on the right side and detected w/ Doppler on the left side.  Pulmonary:     Effort: Pulmonary effort is normal.  Skin:    General: Skin is warm and dry.  Neurological:     Mental Status: He is alert and oriented to person, place, and time.  Psychiatric:        Mood and Affect: Mood normal.        Behavior: Behavior normal.        Thought Content: Thought content normal.        Judgment: Judgment normal.     BP 117/70   Pulse 61   Past Medical History:  Diagnosis Date   Arthritis    CHF (congestive heart  failure) (HCC)    Diabetes mellitus without complication (HCC)    Erectile dysfunction    History of kidney stones    Hypertension    Sleep apnea     Social History   Socioeconomic History   Marital status: Single    Spouse name: Not on file   Number of children: 5   Years of education: 70   Highest education level: 12th grade  Occupational History   Occupation: retired  Tobacco Use   Smoking status: Never   Smokeless tobacco: Never  Vaping Use   Vaping status: Never Used  Substance and Sexual Activity   Alcohol use: No   Drug use: No   Sexual activity: Yes    Birth control/protection: Condom  Other Topics Concern   Not on file  Social History Narrative   Not on file   Social Drivers of Health   Financial Resource Strain: Patient Declined (07/23/2023)   Received from [facility]   Overall Financial Resource Strain (CARDIA)    Difficulty of Paying Living Expenses: Patient declined  Food Insecurity: Patient Declined (07/23/2023)   Received from [facility]   Hunger Vital Sign    Within the past 12 months, you worried that your food would run out before you got the money to buy more.: Patient declined    Within the past 12 months, the food you bought just didn't last and you didn't have money to get more.: Patient declined  Recent Concern: Food Insecurity - Food Insecurity Present (06/08/2023)   Hunger Vital Sign    Worried About Running Out of Food in the Last Year: Sometimes true    Ran Out of Food in the Last Year: Never true  Transportation Needs: Patient Declined (07/23/2023)   Received from [facility] - Transportation    In the past 12 months, has lack of transportation kept you from medical appointments  or from getting medications?: Patient declined    Lack of Transportation (Non-Medical): Patient declined  Physical Activity: Sufficiently Active (03/26/2017)   Exercise Vital Sign    Days of Exercise  per Week: 7 days    Minutes of Exercise per Session: 60 min  Stress: No Stress Concern Present (03/26/2017)   [employer] of Occupational Health - Occupational Stress Questionnaire    Feeling of Stress : Only a little  Social Connections: Moderately Isolated (06/08/2023)   Social Connection and Isolation Panel    Frequency of Communication with Friends and Family: More than three times a week    Frequency of Social Gatherings with Friends and Family: Once a week    Attends Religious Services: More than 4 times per year    Active Member of [employer] or Organizations: No    Attends Banker Meetings: Never    Marital Status: Never married  Intimate Partner Violence: Not At Risk (06/08/2023)   Humiliation, Afraid, Rape, and Kick questionnaire    Fear of Current or Ex-Partner: No    Emotionally Abused: No    Physically Abused: No    Sexually Abused: No    Past Surgical History:  Procedure Laterality Date   ALLOGRAFT APPLICATION Right 07/26/2023   Procedure: APPLICATION, ALLOGRAFT, SKIN;  Surgeon: Silva Juliene SAUNDERS, DPM;  Location: [facility] ORS;  Service: Orthopedics/Podiatry;  Laterality: Right;   APPLICATION OF WOUND VAC Right 07/26/2023   Procedure: APPLICATION, WOUND VAC;  Surgeon: Silva Juliene SAUNDERS, DPM;  Location: [facility];  Service: Orthopedics/Podiatry;  Laterality: Right;   CARDIAC CATHETERIZATION     COLONOSCOPY N/A 03/07/2021   Procedure: COLONOSCOPY;  Surgeon: Maryruth Ole DASEN, MD;  Location: [facility];  Service: Endoscopy;  Laterality: N/A;  IDDM   COLONOSCOPY WITH PROPOFOL  N/A 04/09/2017   Procedure: COLONOSCOPY WITH PROPOFOL ;  Surgeon: Therisa Bi, MD;  Location: [facility] ENDOSCOPY;  Service: Gastroenterology;  Laterality: N/A;   CORONARY ARTERY BYPASS GRAFT  2003   GRAFT APPLICATION  06/17/2023   Procedure: GRAFT APPLICATION WITH DELAYED PRIMARY CLOSURE;  Surgeon: Malvin Marsa FALCON, DPM;  Location: [facility];  Service: Orthopedics/Podiatry;;   IRRIGATION AND  DEBRIDEMENT FOOT Right 06/17/2023   Procedure: IRRIGATION AND DEBRIDEMENT FOOT;  Surgeon: Malvin Marsa FALCON, DPM;  Location: [facility];  Service: Orthopedics/Podiatry;  Laterality: Right;   JOINT REPLACEMENT     total knee   LOWER EXTREMITY ANGIOGRAPHY Right 06/13/2023   Procedure: Lower Extremity Angiography;  Surgeon: Marea Selinda RAMAN, MD;  Location: [facility] INVASIVE CV LAB;  Service: Cardiovascular;  Laterality: Right;   LOWER EXTREMITY ANGIOGRAPHY Left 08/01/2023   Procedure: Lower Extremity Angiography;  Surgeon: Marea Selinda RAMAN, MD;  Location: [facility] CV [facility];  Service: Cardiovascular;  Laterality: Left;   RENAL ARTERY STENT     due to kidney stones   TRANSMETATARSAL AMPUTATION Right 06/14/2023   Procedure: AMPUTATION, FOOT, TRANSMETATARSAL;  Surgeon: Malvin Marsa FALCON, DPM;  Location: [facility];  Service: Orthopedics/Podiatry;  Laterality: Right;   WOUND DEBRIDEMENT Right 07/26/2023   Procedure: DEBRIDEMENT, WOUND;  Surgeon: Silva Juliene SAUNDERS, DPM;  Location: [facility];  Service: Orthopedics/Podiatry;  Laterality: Right;    Family History  Problem Relation Age of Onset   Diabetes Mother    Alcoholism Father    Diabetes Sister    Diabetes Sister    Arrhythmia Sister    Diabetes Sister     No Known Allergies     Latest Ref Rng & Units 07/01/2023    7:03 PM 06/25/2023  2:46 PM 06/19/2023    3:33 AM  CBC  WBC 4.0 - 10.5 K/uL 7.0  9.4  12.9   Hemoglobin 13.0 - 17.0 g/dL 88.5  88.8  9.8   Hematocrit 39.0 - 52.0 % 35.8  34.0  30.1   Platelets 150 - 400 K/uL 332  431  417       CMP     Component Value Date/Time   NA 134 (L) 07/01/2023 1903   K 4.3 07/01/2023 1903   CL 99 07/01/2023 1903   CO2 25 07/01/2023 1903   GLUCOSE 233 (H) 07/01/2023 1903   BUN 19 08/01/2023 1013   CREATININE 1.18 08/01/2023 1013   CALCIUM  9.0 07/01/2023 1903   PROT 8.6 (H) 06/25/2023 1446   ALBUMIN 2.8 (L) 06/25/2023 1446   AST 25 06/25/2023 1446   ALT 40 06/25/2023 1446   ALKPHOS 73 06/25/2023  1446   BILITOT 0.4 06/25/2023 1446   GFRNONAA >60 08/01/2023 1013     VAS US  ABI WITH/WO TBI Result Date: 07/23/2023  LOWER EXTREMITY DOPPLER STUDY Patient Name:  Scott Hale  Date of Exam:   07/19/2023 Medical Rec #: [id number]          Accession #:    [id number] Date of Birth: 24-Aug-1950         Patient Gender: M Patient Age:   72 years Exam Location:  [facility] Procedure:      VAS US  ABI WITH/WO TBI Referring Phys: SELINDA DEW --------------------------------------------------------------------------------  High Risk Factors: Hypertension, Diabetes, no history of smoking.  Vascular Interventions: 06/13/2023 Stent right SFA , ATA. Performing Technologist: Donnice Charnley RVT  Examination Guidelines: A complete evaluation includes at minimum, Doppler waveform signals and systolic blood pressure reading at the level of bilateral brachial, anterior tibial, and posterior tibial arteries, when vessel segments are accessible. Bilateral testing is considered an integral part of a complete examination. Photoelectric Plethysmograph (PPG) waveforms and toe systolic pressure readings are included as required and additional duplex testing as needed. Limited examinations for reoccurring indications may be performed as noted.  ABI Findings: +---------+------------------+-----+----------+---------------------+ Right    Rt Pressure (mmHg)IndexWaveform  Comment               +---------+------------------+-----+----------+---------------------+ Brachial 140                                                    +---------+------------------+-----+----------+---------------------+ PTA      85                0.61 monophasic                      +---------+------------------+-----+----------+---------------------+ DP       152               1.09 Hyperemic                       +---------+------------------+-----+----------+---------------------+ Great Toe                                  Metatarsal amputation +---------+------------------+-----+----------+---------------------+ +---------+------------------+-----+----------+-------+ Left     Lt Pressure (mmHg)IndexWaveform  Comment +---------+------------------+-----+----------+-------+ Brachial 136                                      +---------+------------------+-----+----------+-------+  PTA      61                0.44 monophasic        +---------+------------------+-----+----------+-------+ DP       77                0.55 monophasic        +---------+------------------+-----+----------+-------+ Great Toe44                0.31                   +---------+------------------+-----+----------+-------+ +-------+-----------+-----------+------------+------------+ ABI/TBIToday's ABIToday's TBIPrevious ABIPrevious TBI +-------+-----------+-----------+------------+------------+ Right  1.09       amputation                          +-------+-----------+-----------+------------+------------+ Left   0.55       0.31                                +-------+-----------+-----------+------------+------------+   Summary: Right: Resting right ankle-brachial index is within normal range. Left: Resting left ankle-brachial index indicates moderate left lower extremity arterial disease. The left toe-brachial index is abnormal. *See table(s) above for measurements and observations.  Electronically signed by Selinda Gu MD on 07/23/2023 at 8:32:53 AM.    Final        Assessment & Plan:   1. Peripheral arterial disease with history of revascularization (HCC) (Primary) Based on the patient's noninvasive studies today as well as ongoing improvement of his wound, the patient appears to have adequate perfusion for wound healing.  He will continue to follow with podiatry for his wound care.  Will have the patient return on his routine follow-up schedule in 3 months for noninvasive studies.  However he is advised that if  there is concern that his wound may be progressing he should follow-up with us  sooner for studies.  2. Primary hypertension Continue antihypertensive medications as already ordered, these medications have been reviewed and there are no changes at this time.  3. Type 2 diabetes mellitus with diabetic peripheral angiopathy and gangrene, with long-term current use of insulin  (HCC) Continue hypoglycemic medications as already ordered, these medications have been reviewed and there are no changes at this time.  Hgb A1C to be monitored as already arranged by primary service   Current Outpatient Medications on File Prior to Visit  Medication Sig Dispense Refill   acetaminophen  (TYLENOL ) 500 MG tablet Take 500 mg by mouth every 6 (six) hours as needed for mild pain (pain score 1-3), headache, fever or moderate pain (pain score 4-6).     amoxicillin -clavulanate (AUGMENTIN ) 875-125 MG tablet Take 1 tablet by mouth 2 (two) times daily. 60 tablet 1   aspirin  EC 81 MG tablet Take 1 tablet (81 mg total) by mouth daily. Swallow whole. 150 tablet 2   atorvastatin  (LIPITOR) 40 MG tablet TAKE 1 TABLET BY MOUTH EVERY DAY *NEW PRESCRIPTION REQUEST* 90 tablet 1   carvedilol  (COREG ) 3.125 MG tablet TAKE ONE (1) TABLET BY MOUTH TWICE DAILY *NEW PRESCRIPTION REQUEST* 180 tablet 1   clopidogrel  (PLAVIX ) 75 MG tablet Take 75 mg by mouth daily.     dapagliflozin  propanediol (FARXIGA ) 10 MG TABS tablet TAKE 1 TABLET BY MOUTH EVERY DAY BEFORE BREAKFAST *NEW PRESCRIPTION REQUEST* 90 tablet 1   furosemide  (LASIX ) 40 MG tablet Take 1 tablet by mouth once daily 90 tablet  0   glipiZIDE (GLUCOTROL) 5 MG tablet Take 5 mg by mouth daily before breakfast.     insulin  glargine (LANTUS ) 100 UNIT/ML Solostar Pen Inject 45 Units into the skin at bedtime. (Patient taking differently: Inject 80 Units into the skin at bedtime. Basaglar ) 15 mL 0   sacubitril -valsartan  (ENTRESTO ) 97-103 MG Take 1 tablet by mouth 2 (two) times daily. 180  tablet 1   sodium chloride  (OCEAN) 0.65 % SOLN nasal spray Place 1 spray into both nostrils daily as needed for congestion.     spironolactone  (ALDACTONE ) 25 MG tablet Take 25 mg by mouth daily.     No current facility-administered medications on file prior to visit.    There are no Patient Instructions on file for this visit. No follow-ups on file.   Talina Pleitez E Lucan Riner, NP

## 2023-09-09 LAB — VAS US ABI WITH/WO TBI
Left ABI: 1.13
Right ABI: 1.11

## 2023-09-11 NOTE — Telephone Encounter (Signed)
 error

## 2023-09-15 ENCOUNTER — Other Ambulatory Visit: Payer: Self-pay | Admitting: Family

## 2023-09-25 ENCOUNTER — Ambulatory Visit (INDEPENDENT_AMBULATORY_CARE_PROVIDER_SITE_OTHER): Admitting: Podiatry

## 2023-09-25 VITALS — Ht 72.0 in | Wt 230.0 lb

## 2023-09-25 DIAGNOSIS — L97515 Non-pressure chronic ulcer of other part of right foot with muscle involvement without evidence of necrosis: Secondary | ICD-10-CM | POA: Diagnosis not present

## 2023-09-25 NOTE — Progress Notes (Signed)
  Subjective:  Patient ID: Scott Hale, male    DOB: February 09, 1951,  MRN: [id number]  Chief Complaint  Patient presents with   Wound Check    RM 2 POV # 3 DOS 07/26/23 RT FOOT DEBRIDMENT OF BONE, revision of mid foot amputaion w/ graft and wound vac    DOS: 07/26/2023 Procedure: Right foot debridement and Kerecis application with Prevena wound VAC  73 y.o. male returns for post-op check.    Review of Systems: Negative except as noted in the HPI. Denies N/V/F/Ch.   Objective:   There were no vitals filed for this visit.  Body mass index is 31.19 kg/m. Constitutional Well developed. Well nourished.  Vascular Foot warm and well perfused.  Calf is soft and supple, no posterior calf or knee pain, negative Homans' sign  Neurologic Normal speech. Oriented to person, place, and time. Epicritic sensation to light touch grossly absent bilaterally.  Dermatologic Graft integrating well.  Residual ulceration measures 10 x 3.5 x 0.6 cm.  Exposed subcutaneous tissue as well as fascia and tendinous tissue.  Fibrogranular wound bed.  No sign of infection.  Mild to moderate drainage, slight odor today no cellulitis  Orthopedic: No pain to palpation noted about the surgical site.    Assessment:   1. Ulcer of right foot with muscle involvement without evidence of necrosis [facility])      Plan:  Patient was evaluated and treated and all questions answered.  S/p foot surgery right Healing well.  There was some odor but no purulence.  Culture of the wound that was taken after debridement.  Wound was debrided today of nonviable tissue to the subcutaneous layer to remove fibrin and slough and surrounding hyperkeratosis to the muscular layer, there were some tenderness and fibrous attachments of the fascia still present in the wound bed which were excised.  Postdebridement measurements are noted above.  Total area of debridement 35 cm.  Today applied Iodosorb, ABD pad, Kerlix, Ace wrap.  Resume VAC  therapy as scheduled on Friday.  May reevaluate for skin substitute application if not improving by next visit  Return in about 3 weeks (around 10/16/2023) for wound care.

## 2023-09-25 NOTE — Addendum Note (Signed)
 Addended by: JASMINE VERNELL PARAS on: 09/25/2023 02:22 PM   Modules accepted: Orders

## 2023-09-29 LAB — WOUND CULTURE
MICRO NUMBER:: 16826054
SPECIMEN QUALITY:: ADEQUATE

## 2023-10-01 ENCOUNTER — Ambulatory Visit: Payer: Self-pay | Admitting: Podiatry

## 2023-10-01 MED ORDER — DOXYCYCLINE HYCLATE 100 MG PO TABS: 100.0000 mg | ORAL_TABLET | Freq: Two times a day (BID) | ORAL | 0 refills | Status: AC

## 2023-10-16 ENCOUNTER — Ambulatory Visit: Admitting: Podiatry

## 2023-10-16 VITALS — Ht 72.0 in | Wt 230.0 lb

## 2023-10-16 DIAGNOSIS — L97512 Non-pressure chronic ulcer of other part of right foot with fat layer exposed: Secondary | ICD-10-CM

## 2023-10-18 ENCOUNTER — Encounter: Payer: Self-pay | Admitting: Podiatry

## 2023-10-18 NOTE — Progress Notes (Signed)
 Subjective:  Patient ID: Scott Hale, male    DOB: November 05, 1950,  MRN: [id number]  No chief complaint on file.   DOS: 07/26/2023 Procedure: Right foot debridement and Kerecis application with Prevena wound VAC  73 y.o. male returns for post-op check.    Review of Systems: Negative except as noted in the HPI. Denies N/V/F/Ch.   Objective:   There were no vitals filed for this visit.  Body mass index is 31.19 kg/m. Constitutional Well developed. Well nourished.  Vascular Foot warm and well perfused.  Calf is soft and supple, no posterior calf or knee pain, negative Homans' sign  Neurologic Normal speech. Oriented to person, place, and time. Epicritic sensation to light touch grossly absent bilaterally.  Dermatologic Graft integrating well.  Full-thickness ulceration with exposed subcutaneous tissue, fascia no longer exposed.  Fibrogranular wound bed.  No sign of infection.  Mild to moderate drainage, slight odor today no cellulitis  Orthopedic: No pain to palpation noted about the surgical site.      Assessment:   1. Ulcer of right foot with fat layer exposed (HCC)      Plan:  Patient was evaluated and treated and all questions answered.  S/p foot surgery right Ulcer right foot -We discussed the etiology and factors that are a part of the wound healing process.  We also discussed the risk of infection both soft tissue and osteomyelitis from open ulceration.  Discussed the risk of limb loss if this happens or worsens. -Debridement as below. -Dressed with Iodosorb, DSD. -Continue home dressing changes 3 times weekly with wound VAC -Continue off-loading with restricted weight bearing.  Expect after next visit we will transition to a surgical type shoe and weightbearing as tolerated -Vascular testing no further intervention is needed -HgbA1c: 8.0% on 08/29/2023 -Last antibiotics: They did not pick up the doxycycline  last culture grew staph and they will start this  today .  Procedure: Excisional Debridement of Wound Tool: Sharp #312 chisel blade/tissue nipper Type of Debridement: Sharp Excisional Frequency: Every 2 to 3 weeks until appropriately healed.  Dressing is to be changed daily/keeping the wound clean and dry Rationale: Removal of non-viable soft tissue from the wound to promote healing.  Anesthesia: none Pre-Debridement Wound Measurements: 7.0 cm x 2.0 cm x 0.5 cm  Post-Debridement Wound Measurements: 7.0 cm x 2.5 cm x 0.5 cm  Area devitalized tissue removed(nonviable tissue only): 0.5 cm x 0 cm.  Blood loss: Minimal (<50cc) Hemostasis: manual pressure Depth of Debridement: with fat layer exposed Description of tissue removed: Slough, Necrotic, and Devitalized Tissue Technique: The wound and the surrounding skin were prepped in usual aseptic fashion.  Aseptic technique was maintained throughout the procedure.  Using #312 blade/tissue nipper sharp debridement of necrotic/nonviable tissue was performed until healthy bleeding wound bed was achieved.  No underlying bone or tendon was exposed during debridement.  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  Debridement removed 0.5 cm x 0 cm of the necrotic tissue and subcutaneous tissue and no purulence present. Presence/absence of tissue: Necrotic tissue/nonviable tissue present at the base of the wound.  Sharp debridement was performed to remove the necrotic tissue/nonviable tissue back to viable tissue.  No devitalized/nonviable tissue present postdebridement.  Wound appeared clean and clear of infection No material in the wound was present that was identified to be inhibiting healing. Dressing: Dry, sterile, dressing  with Iodosorb on ulcer base followed by dry sterile dressings gauze 4 x 4 ABD pad and nonadherent layer. Disposition: Patient  tolerated procedure well. Patient to return for ongoing wound care as noted below.          Return in about 2 weeks (around 10/30/2023) for wound care.

## 2023-10-30 ENCOUNTER — Ambulatory Visit (INDEPENDENT_AMBULATORY_CARE_PROVIDER_SITE_OTHER): Admitting: Podiatry

## 2023-10-30 VITALS — Ht 72.0 in | Wt 230.0 lb

## 2023-10-30 DIAGNOSIS — L97512 Non-pressure chronic ulcer of other part of right foot with fat layer exposed: Secondary | ICD-10-CM | POA: Diagnosis not present

## 2023-11-02 NOTE — Progress Notes (Signed)
 Subjective:  Patient ID: Scott Hale, male    DOB: [birth year],  MRN: [id number]  Chief Complaint  Patient presents with   Wound Check    Rm 8 Patient is here for wound care of the right foot.    DOS: 07/26/2023 Procedure: Right foot debridement and Kerecis application with Prevena wound VAC  73 y.o. male returns for post-op check.    Review of Systems: Negative except as noted in the HPI. Denies N/V/F/Ch.   Objective:   There were no vitals filed for this visit.  Body mass index is 31.19 kg/m. Constitutional Well developed. Well nourished.  Vascular Foot warm and well perfused.  Calf is soft and supple, no posterior calf or knee pain, negative Homans' sign  Neurologic Normal speech. Oriented to person, place, and time. Epicritic sensation to light touch grossly absent bilaterally.  Dermatologic   Full-thickness ulceration with exposed subcutaneous tissue, fascia no longer exposed.  Fibrogranular wound bed.  No sign of infection.  No drainage or odor today.  Healing well  Orthopedic: No pain to palpation noted about the surgical site.      Assessment:   No diagnosis found.    Plan:  Patient was evaluated and treated and all questions answered.  S/p foot surgery right Ulcer right foot -We discussed the etiology and factors that are a part of the wound healing process.  We also discussed the risk of infection both soft tissue and osteomyelitis from open ulceration.  Discussed the risk of limb loss if this happens or worsens. -Debridement as below. -Dressed with Prisma collagen, DSD. -Continue home dressing changes 3 times weekly with Prisma collagen dressings.  Discontinue wound VAC. - Gradual weightbearing to the heel as allowable at this point -Vascular testing no further intervention is needed -HgbA1c: 8.0% on 08/29/2023 -Last antibiotics: They did not pick up the doxycycline  last culture grew staph and they will start this today .  Procedure: Excisional  Debridement of Wound Tool: Sharp #312 chisel blade/tissue nipper Type of Debridement: Sharp Excisional Frequency: Every 2 to 3 weeks until appropriately healed.  Dressing is to be changed daily/keeping the wound clean and dry Rationale: Removal of non-viable soft tissue from the wound to promote healing.  Anesthesia: none Pre-Debridement Wound Measurements: 7.0 cm x 2.0 cm x 0.5 cm  Post-Debridement Wound Measurements: 7.5 cm x 2.5 cm x 0.5 cm  Area devitalized tissue removed(nonviable tissue only): 0.5 cm x 0.5 cm.  Blood loss: Minimal (<50cc) Hemostasis: manual pressure Depth of Debridement: with fat layer exposed Description of tissue removed: Slough, Necrotic, and Devitalized Tissue Technique: The wound and the surrounding skin were prepped in usual aseptic fashion.  Aseptic technique was maintained throughout the procedure.  Using #312 blade/tissue nipper sharp debridement of necrotic/nonviable tissue was performed until healthy bleeding wound bed was achieved.  No underlying bone or tendon was exposed during debridement.  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  Debridement removed 0.5 cm x 0.5 cm of the necrotic tissue and subcutaneous tissue and no purulence present. Presence/absence of tissue: Necrotic tissue/nonviable tissue present at the base of the wound.  Sharp debridement was performed to remove the necrotic tissue/nonviable tissue back to viable tissue.  No devitalized/nonviable tissue present postdebridement.  Wound appeared clean and clear of infection No material in the wound was present that was identified to be inhibiting healing. Dressing:  Dry, sterile, dressing with Prisma collagen on ulcer base followed by dry sterile dressings gauze 4 x 4 ABD pad and nonadherent layer. Disposition: Patient tolerated procedure  well. Patient to return for ongoing wound care as noted below.          Return in about 3 weeks (around 11/20/2023), or if symptoms worsen or fail to improve, for wound care.

## 2023-11-19 ENCOUNTER — Other Ambulatory Visit: Payer: Self-pay | Admitting: Family

## 2023-11-20 ENCOUNTER — Ambulatory Visit (INDEPENDENT_AMBULATORY_CARE_PROVIDER_SITE_OTHER): Admitting: Podiatry

## 2023-11-20 VITALS — Ht 72.0 in | Wt 230.0 lb

## 2023-11-20 DIAGNOSIS — L97512 Non-pressure chronic ulcer of other part of right foot with fat layer exposed: Secondary | ICD-10-CM | POA: Diagnosis not present

## 2023-11-20 NOTE — Progress Notes (Signed)
 Subjective:  Patient ID: Scott Hale, male    DOB: [birth year],  MRN: [id number]  Chief Complaint  Patient presents with   Wound Check    Rm 3 Patient is here of ulcer of right foot. Ulcer is partially closed with moderate drainage.    DOS: 07/26/2023 Procedure: Right foot debridement and Kerecis application with Prevena wound VAC  73 y.o. male returns for post-op check.    Review of Systems: Negative except as noted in the HPI. Denies N/V/F/Ch.   Objective:   There were no vitals filed for this visit.  Body mass index is 31.19 kg/m. Constitutional Well developed. Well nourished.  Vascular Foot warm and well perfused.  Calf is soft and supple, no posterior calf or knee pain, negative Homans' sign  Neurologic Normal speech. Oriented to person, place, and time. Epicritic sensation to light touch grossly absent bilaterally.  Dermatologic   Full-thickness ulceration with exposed subcutaneous tissue, fascia no longer exposed.  Fibrogranular wound bed.  No sign of infection.  No drainage or odor today.  Healing well  Orthopedic: No pain to palpation noted about the surgical site.      Assessment:   1. 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  S/p foot surgery right Ulcer right foot -We discussed the etiology and factors that are a part of the wound healing process.  We also discussed the risk of infection both soft tissue and osteomyelitis from open ulceration.  Discussed the risk of limb loss if this happens or worsens. -Debridement as below. -Dressed with Prisma collagen, DSD. -Continue home dressing changes 3 times weekly with Prisma collagen dressings.  Discontinue wound VAC. - Gradual weightbearing to the heel as allowable at this point -Vascular testing no further intervention is needed -HgbA1c: 8.0% on 08/29/2023  Procedure: Excisional Debridement of Wound Tool: Sharp #312  chisel blade/tissue nipper Type of Debridement: Sharp Excisional Frequency: Every 2 to 3 weeks until appropriately healed.  Dressing is to be changed daily/keeping the wound clean and dry Rationale: Removal of non-viable soft tissue from the wound to promote healing.  Anesthesia: none Pre-Debridement Wound Measurements: 3.0 x 0.5 cm x 0.5 cm  Post-Debridement Wound Measurements: 5.0 x 1.0 cm x 0.5 cm  Area devitalized tissue removed(nonviable tissue only): 2.0 cm x 0.5 cm.  Blood loss: Minimal (<50cc) Hemostasis: manual pressure Depth of Debridement: with fat layer exposed Description of tissue removed: Slough, Necrotic, and Devitalized Tissue Technique: The wound and the surrounding skin were prepped in usual aseptic fashion.  Aseptic technique was maintained throughout the procedure.  Using #312 blade/tissue nipper sharp debridement of necrotic/nonviable tissue was performed until healthy bleeding wound bed was achieved.  No underlying bone or tendon was exposed during debridement.  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  Debridement removed 2.0 cm x 0.5 cm of the necrotic tissue and subcutaneous tissue and no purulence present. Presence/absence of tissue: Necrotic tissue/nonviable tissue present at the base of the wound.  Sharp debridement was performed to remove the necrotic tissue/nonviable tissue back to viable tissue.  No devitalized/nonviable tissue present postdebridement.  Wound appeared clean and clear of infection No material in the wound was present that was identified to be inhibiting healing. Dressing: Dry, sterile, dressing with Prisma collagen on  ulcer base followed by dry sterile dressings gauze 4 x 4 ABD pad and nonadherent layer. Disposition: Patient tolerated procedure well. Patient to return for ongoing wound care  as noted below.          Return in about 3 weeks (around 12/11/2023) for wound care.

## 2023-12-03 ENCOUNTER — Telehealth: Payer: Self-pay | Admitting: Family

## 2023-12-03 NOTE — Telephone Encounter (Signed)
 Called to confirm/remind patient of their appointment at [facility] on 12/04/23.   Appointment:   [x] Confirmed  [] Left mess   [] No answer/No voice mail  [] VM Full/unable to leave message  [] Phone not in service  Patient reminded to bring all medications and/or complete list.  Confirmed patient has transportation. Gave directions, instructed to utilize valet parking.

## 2023-12-03 NOTE — Progress Notes (Unsigned)
 [facility] Note     PCP: Valora Agent, MD (last seen 02/25) Primary Cardiologist: Bosie Kent, MD (last seen 09/22)  Chief Complaint: heart failure visit  HPI:  Scott Hale is a 73 y/o male with a history of diabetes, HTN and chronic heart failure.   Was in the ED 10/28/22 due to shortness of breath for a week, worse with exertion along with orthopnea. BNP elevated. Ultrasound negative for DVT.  EKG chest x-ray labs are all unremarkable,   Echo 04/29/17: EF 35% along with mild Scott/TR/AR.   Stress test 04/29/17: EF of 31% along with inferolateral T wave inversions with stress.  Echo 03/31/20: EF 55-60% along with mild Scott. Echo 05/14/22: EF 45-50% with mild LAE and mild Scott.   He presents today for a heart failure follow-up visit. Currently denies any shortness of breath, chest pain, cough, palpitations, abdominal distention, pedal edema or dizziness. Says that some mornings he wakes up and both of his hands are tingling/ numb. Once he gets up and moves around, the symptoms resolve. Has also noted a gradual weight gain because he's continuing to lift weights and has increased his muscle mass which is why he thinks his weight has increased.   ROS: All systems negative except as listed in HPI, PMH and Problem List.  SH:  Social History   Socioeconomic History   Marital status: Single    Spouse name: Not on file   Number of children: 5   Years of education: 12   Highest education level: 12th grade  Occupational History   Occupation: retired  Tobacco Use   Smoking status: Never   Smokeless tobacco: Never  Vaping Use   Vaping status: Never Used  Substance and Sexual Activity   Alcohol use: No   Drug use: No   Sexual activity: Yes    Birth control/protection: Condom  Other Topics Concern   Not on file  Social History Narrative   Not on file   Social Drivers of Health   Financial Resource Strain: Patient Declined (07/23/2023)   Received from [facility]   Overall Financial Resource Strain (CARDIA)    Difficulty of Paying Living Expenses: Patient declined  Food Insecurity: Patient Declined (07/23/2023)   Received from [facility]   Hunger Vital Sign    Within the past 12 months, you worried that your food would run out before you got the money to buy more.: Patient declined    Within the past 12 months, the food you bought just didn't last and you didn't have money to get more.: Patient declined  Recent Concern: Food Insecurity - Food Insecurity Present (06/08/2023)   Hunger Vital Sign    Worried About Running Out of Food in the Last Year: Sometimes true    Ran Out of Food in the Last Year: Never true  Transportation Needs: Patient Declined (07/23/2023)   Received from [facility] - Transportation    In the past 12 months, has lack of transportation kept you from medical appointments or from getting medications?: Patient declined    Lack of Transportation (Non-Medical): Patient declined  Physical Activity: Sufficiently Active (03/26/2017)   Exercise Vital Sign    Days of Exercise per Week: 7 days    Minutes of Exercise per Session: 60 min  Stress: No Stress Concern Present (03/26/2017)   [employer] of Occupational Health - Occupational Stress Questionnaire    Feeling of Stress : Only  a little  Social Connections: Moderately Isolated (06/08/2023)   Social Connection and Isolation Panel    Frequency of Communication with Friends and Family: More than three times a week    Frequency of Social Gatherings with Friends and Family: Once a week    Attends Religious Services: More than 4 times per year    Active Member of [employer] or Organizations: No    Attends Banker Meetings: Never    Marital Status: Never married  Intimate Partner Violence: Not At Risk (06/08/2023)   Humiliation, Afraid, Rape, and Kick questionnaire    Fear of Current or Ex-Partner: No    Emotionally  Abused: No    Physically Abused: No    Sexually Abused: No    FH:  Family History  Problem Relation Age of Onset   Diabetes Mother    Alcoholism Father    Diabetes Sister    Diabetes Sister    Arrhythmia Sister    Diabetes Sister     Past Medical History:  Diagnosis Date   Arthritis    CHF (congestive heart failure) (HCC)    Diabetes mellitus without complication (HCC)    Erectile dysfunction    History of kidney stones    Hypertension    Sleep apnea     Current Outpatient Medications  Medication Sig Dispense Refill   acetaminophen  (TYLENOL ) 500 MG tablet Take 500 mg by mouth every 6 (six) hours as needed for mild pain (pain score 1-3), headache, fever or moderate pain (pain score 4-6).     amoxicillin -clavulanate (AUGMENTIN ) 875-125 MG tablet Take 1 tablet by mouth 2 (two) times daily. 60 tablet 1   aspirin  EC 81 MG tablet Take 1 tablet (81 mg total) by mouth daily. Swallow whole. 150 tablet 2   atorvastatin  (LIPITOR) 40 MG tablet TAKE 1 TABLET BY MOUTH EVERY DAY *NEW PRESCRIPTION REQUEST* 90 tablet 1   carvedilol  (COREG ) 3.125 MG tablet Take 1 tablet by mouth twice daily 180 tablet 0   clopidogrel  (PLAVIX ) 75 MG tablet Take 75 mg by mouth daily.     dapagliflozin  propanediol (FARXIGA ) 10 MG TABS tablet TAKE 1 TABLET BY MOUTH EVERY DAY BEFORE BREAKFAST *NEW PRESCRIPTION REQUEST* 90 tablet 1   furosemide  (LASIX ) 40 MG tablet Take 1 tablet (40 mg total) by mouth daily. 30 tablet 0   glipiZIDE (GLUCOTROL) 5 MG tablet Take 5 mg by mouth daily before breakfast.     insulin  glargine (LANTUS ) 100 UNIT/ML Solostar Pen Inject 45 Units into the skin at bedtime. (Patient taking differently: Inject 80 Units into the skin at bedtime. Basaglar ) 15 mL 0   sacubitril -valsartan  (ENTRESTO ) 97-103 MG Take 1 tablet by mouth 2 (two) times daily. 180 tablet 1   sodium chloride  (OCEAN) 0.65 % SOLN nasal spray Place 1 spray into both nostrils daily as needed for congestion.     spironolactone   (ALDACTONE ) 25 MG tablet Take 1 tablet (25 mg total) by mouth daily. 30 tablet 0   No current facility-administered medications for this visit.   There were no vitals filed for this visit.  Wt Readings from Last 3 Encounters:  11/20/23 230 lb (104.3 kg)  10/30/23 230 lb (104.3 kg)  10/16/23 230 lb (104.3 kg)   Lab Results  Component Value Date   CREATININE 1.18 08/01/2023   CREATININE 1.42 (H) 07/01/2023   CREATININE 1.40 (H) 06/25/2023    PHYSICAL EXAM:  General: Well appearing. No resp difficulty HEENT: normal Neck: supple, no JVD Cor: Regular rhythm,  bradycardic. No rubs, gallops or murmurs Lungs: clear Abdomen: soft, nontender, nondistended. Extremities: no cyanosis, clubbing, rash, edema Neuro: alert & oriented X 3. Moves all 4 extremities w/o difficulty. Affect pleasant   ECG: not done   ASSESSMENT & PLAN:  1: NICM with mildly reduced ejection fraction- - etiology likely d/t HTN - NYHA class I - euvolemic - weighing daily; reminded to call for an overnight weight gain of >2 pounds or a weekly weight gain of >5 pounds - weight up 13 pounds from last visit here 6 months ago; he says it's gradual gain due to increasing muscle mass - Echo 03/31/20: EF 55-60% along with mild Scott. - Echo 05/14/22: EF 45-50% with mild LAE and mild Scott.  - going to the gym and power lifting weights daily at [employer]  - goes out dancing on Saturday nights - not adding salt to his food. Reviewed the importance of closely following a 2000mg  sodium diet  - does eat out occasionally  - keeping daily fluid intake to 60-64 oz - saw cardiology (Fath) 09/22; cardiology referral placed to [facility] - continue carvedilol  3.125mg  BID; HR will not allow for titration - continue farxiga  10mg  daily - continue furosemide  40mg  daily  - continue entresto  97/103mg  BID - continue spironolactone  25mg  daily - BNP 11/12/22 was 1639.6  2: HTN- - BP 140/73 - saw PCP Loree) 02/25 - BMP 05/27/23  reviewed: sodium 139, potassium 4.0, creatinine 1.5 and GFR 50  3: Diabetes-  - A1c 05/27/23 was 7.6% - saw endocrinology (Solum) 10/24; returns tomorrow - taking 80 units of insulin  if needed; unclear of when he uses it - glucose running 60's-90's  4: Bilateral hand numbness- - he says he occasionally wakes up with this and once he gets up, symptoms resolve - does admit that he's bench pressing heavy weights and strains his neck when lifting - encouraged to use heat before the gym and cold afterwards to decrease the inflammation - followup with PCP if symptoms continue   Return in 6 months, sooner if needed.   Ellouise DELENA Class, FNP 12/03/23

## 2023-12-04 ENCOUNTER — Encounter: Payer: Self-pay | Admitting: Family

## 2023-12-04 ENCOUNTER — Ambulatory Visit: Attending: Family | Admitting: Family

## 2023-12-04 ENCOUNTER — Other Ambulatory Visit (INDEPENDENT_AMBULATORY_CARE_PROVIDER_SITE_OTHER): Payer: Self-pay | Admitting: Vascular Surgery

## 2023-12-04 VITALS — BP 104/79 | HR 69 | Wt 242.0 lb

## 2023-12-04 DIAGNOSIS — Z89431 Acquired absence of right foot: Secondary | ICD-10-CM | POA: Insufficient documentation

## 2023-12-04 DIAGNOSIS — Z7902 Long term (current) use of antithrombotics/antiplatelets: Secondary | ICD-10-CM | POA: Insufficient documentation

## 2023-12-04 DIAGNOSIS — E1169 Type 2 diabetes mellitus with other specified complication: Secondary | ICD-10-CM | POA: Insufficient documentation

## 2023-12-04 DIAGNOSIS — Z7984 Long term (current) use of oral hypoglycemic drugs: Secondary | ICD-10-CM | POA: Diagnosis not present

## 2023-12-04 DIAGNOSIS — Z794 Long term (current) use of insulin: Secondary | ICD-10-CM | POA: Insufficient documentation

## 2023-12-04 DIAGNOSIS — E119 Type 2 diabetes mellitus without complications: Secondary | ICD-10-CM | POA: Diagnosis not present

## 2023-12-04 DIAGNOSIS — I739 Peripheral vascular disease, unspecified: Secondary | ICD-10-CM

## 2023-12-04 DIAGNOSIS — Z79899 Other long term (current) drug therapy: Secondary | ICD-10-CM | POA: Diagnosis not present

## 2023-12-04 DIAGNOSIS — I10 Essential (primary) hypertension: Secondary | ICD-10-CM | POA: Diagnosis not present

## 2023-12-04 DIAGNOSIS — N644 Mastodynia: Secondary | ICD-10-CM | POA: Diagnosis not present

## 2023-12-04 DIAGNOSIS — I5022 Chronic systolic (congestive) heart failure: Secondary | ICD-10-CM | POA: Insufficient documentation

## 2023-12-04 DIAGNOSIS — M868X7 Other osteomyelitis, ankle and foot: Secondary | ICD-10-CM | POA: Diagnosis not present

## 2023-12-04 DIAGNOSIS — I428 Other cardiomyopathies: Secondary | ICD-10-CM | POA: Diagnosis not present

## 2023-12-04 DIAGNOSIS — M869 Osteomyelitis, unspecified: Secondary | ICD-10-CM

## 2023-12-04 DIAGNOSIS — I110 Hypertensive heart disease with heart failure: Secondary | ICD-10-CM | POA: Insufficient documentation

## 2023-12-04 NOTE — Patient Instructions (Signed)
 It was good to see you today!

## 2023-12-06 ENCOUNTER — Other Ambulatory Visit (INDEPENDENT_AMBULATORY_CARE_PROVIDER_SITE_OTHER)

## 2023-12-06 ENCOUNTER — Encounter (INDEPENDENT_AMBULATORY_CARE_PROVIDER_SITE_OTHER): Payer: Self-pay | Admitting: Nurse Practitioner

## 2023-12-06 ENCOUNTER — Ambulatory Visit (INDEPENDENT_AMBULATORY_CARE_PROVIDER_SITE_OTHER): Admitting: Nurse Practitioner

## 2023-12-06 VITALS — BP 96/63 | HR 62 | Resp 18 | Ht 72.0 in | Wt 243.2 lb

## 2023-12-06 DIAGNOSIS — Z794 Long term (current) use of insulin: Secondary | ICD-10-CM

## 2023-12-06 DIAGNOSIS — I10 Essential (primary) hypertension: Secondary | ICD-10-CM

## 2023-12-06 DIAGNOSIS — Z9889 Other specified postprocedural states: Secondary | ICD-10-CM

## 2023-12-06 DIAGNOSIS — I739 Peripheral vascular disease, unspecified: Secondary | ICD-10-CM | POA: Diagnosis not present

## 2023-12-06 DIAGNOSIS — E1152 Type 2 diabetes mellitus with diabetic peripheral angiopathy with gangrene: Secondary | ICD-10-CM

## 2023-12-08 ENCOUNTER — Encounter (INDEPENDENT_AMBULATORY_CARE_PROVIDER_SITE_OTHER): Payer: Self-pay | Admitting: Nurse Practitioner

## 2023-12-08 NOTE — Progress Notes (Signed)
 Subjective:    Patient ID: Scott Hale, male    DOB: 04/02/1950, 72 y.o.   MRN: [id number] Chief Complaint  Patient presents with   Follow-up    3 months + ABI + Bilat Art Duplex    The patient returns to the office for followup and review status post angiogram with intervention on 08/01/2023.   Procedure: Procedure(s) Performed:             1.  Ultrasound guidance for vascular access right femoral artery             2.  Catheter placement into left SFA from right femoral proximal             3.  Aortogram and selective left lower extremity angiogram             4.  Stent placement to the left popliteal artery with 3.5 mm diameter by 38 mm length Esprit scaffolding system postdilated to 4 mm             5.  Stent placement to the left peroneal artery with 3.5 mm diameter by 38 mm length Esprit scaffolding system             6.  StarClose closure device right femoral artery  .  Additionally he had intervention on the right lower extremity on 06/13/2023 including:  Procedure(s) Performed:             1.  Ultrasound guidance for vascular access left femoral artery             2.  Catheter placement into right common femoral artery from left femoral approach             3.  Aortogram and selective right lower extremity angiogram             4.  Percutaneous transluminal angioplasty of right anterior tibial artery with 3 mm diameter angioplasty balloon throughout and 3.5 mm diameter angioplasty balloon proximally             5.  Stent placement to the proximal right anterior tibial artery with 3.75 mm diameter by 38 mm length Esprit scaffolding system             6.  Angioplasty of the right popliteal artery and distal SFA with 5 mm diameter by 30 cm length Lutonix drug-coated angioplasty balloon             7.  Stent placement to the distal right SFA and most proximal popliteal artery with 7 mm diameter by 12 cm length life stent             8.  Angioplasty of the origin of the right  SFA with 5 mm diameter by 6 cm length Lutonix drug-coated angioplasty balloon             9.  StarClose closure device left femoral artery   The patient notes improvement in the lower extremity symptoms.  The patient is still healing a previous TMA on his right foot.  Based on the portion patient and his wife his wound has just healed.  No longer has a wound VAC in place.  There have been no significant changes to the patient's overall health care.  No documented history of amaurosis fugax or recent TIA symptoms. There are no recent neurological changes noted. No documented history of DVT, PE or superficial thrombophlebitis. The patient denies recent episodes of angina or shortness of  breath.   ABI's Rt=1.16 and Lt=1.16  (previous ABI's Rt=1.11 and Lt=1.13) Duplex US  bilateral lower extremity shows primarily biphasic/triphasic waveforms throughout with some monophasic waveforms in the posterior tibial arteries.    Review of Systems  Skin:  Positive for wound.  Neurological:  Positive for weakness.  All other systems reviewed and are negative.      Objective:   Physical Exam Vitals reviewed.  HENT:     Head: Normocephalic.  Cardiovascular:     Rate and Rhythm: Normal rate.     Pulses:          Dorsalis pedis pulses are detected w/ Doppler on the right side and detected w/ Doppler on the left side.       Posterior tibial pulses are detected w/ Doppler on the right side and detected w/ Doppler on the left side.  Pulmonary:     Effort: Pulmonary effort is normal.  Skin:    General: Skin is warm and dry.  Neurological:     Mental Status: He is alert and oriented to person, place, and time.  Psychiatric:        Mood and Affect: Mood normal.        Behavior: Behavior normal.        Thought Content: Thought content normal.        Judgment: Judgment normal.     BP 96/63   Pulse 62   Resp 18   Ht 6' (1.829 m)   Wt 243 lb 3.2 oz (110.3 kg)   BMI 32.98 kg/m   Past Medical  History:  Diagnosis Date   Arthritis    CHF (congestive heart failure) (HCC)    Diabetes mellitus without complication (HCC)    Erectile dysfunction    History of kidney stones    Hypertension    Sleep apnea     Social History   Socioeconomic History   Marital status: Single    Spouse name: Not on file   Number of children: 5   Years of education: 12   Highest education level: 12th grade  Occupational History   Occupation: retired  Tobacco Use   Smoking status: Never   Smokeless tobacco: Never  Vaping Use   Vaping status: Never Used  Substance and Sexual Activity   Alcohol use: No   Drug use: No   Sexual activity: Yes    Birth control/protection: Condom  Other Topics Concern   Not on file  Social History Narrative   Not on file   Social Drivers of Health   Financial Resource Strain: Patient Declined (07/23/2023)   Received from [facility]   Overall Financial Resource Strain (CARDIA)    Difficulty of Paying Living Expenses: Patient declined  Food Insecurity: Patient Declined (07/23/2023)   Received from [facility]   Hunger Vital Sign    Within the past 12 months, you worried that your food would run out before you got the money to buy more.: Patient declined    Within the past 12 months, the food you bought just didn't last and you didn't have money to get more.: Patient declined  Recent Concern: Food Insecurity - Food Insecurity Present (06/08/2023)   Hunger Vital Sign    Worried About Running Out of Food in the Last Year: Sometimes true    Ran Out of Food in the Last Year: Never true  Transportation Needs: Patient Declined (07/23/2023)   Received from [facility]   PRAPARE -  Transportation    In the past 12 months, has lack of transportation kept you from medical appointments or from getting medications?: Patient declined    Lack of Transportation (Non-Medical): Patient declined  Physical Activity:  Sufficiently Active (03/26/2017)   Exercise Vital Sign    Days of Exercise per Week: 7 days    Minutes of Exercise per Session: 60 min  Stress: No Stress Concern Present (03/26/2017)   [employer] of Occupational Health - Occupational Stress Questionnaire    Feeling of Stress : Only a little  Social Connections: Moderately Isolated (06/08/2023)   Social Connection and Isolation Panel    Frequency of Communication with Friends and Family: More than three times a week    Frequency of Social Gatherings with Friends and Family: Once a week    Attends Religious Services: More than 4 times per year    Active Member of [employer] or Organizations: No    Attends Banker Meetings: Never    Marital Status: Never married  Intimate Partner Violence: Not At Risk (06/08/2023)   Humiliation, Afraid, Rape, and Kick questionnaire    Fear of Current or Ex-Partner: No    Emotionally Abused: No    Physically Abused: No    Sexually Abused: No    Past Surgical History:  Procedure Laterality Date   ALLOGRAFT APPLICATION Right 07/26/2023   Procedure: APPLICATION, ALLOGRAFT, SKIN;  Surgeon: Silva Juliene SAUNDERS, DPM;  Location: [facility] ORS;  Service: Orthopedics/Podiatry;  Laterality: Right;   APPLICATION OF WOUND VAC Right 07/26/2023   Procedure: APPLICATION, WOUND VAC;  Surgeon: Silva Juliene SAUNDERS, DPM;  Location: [facility];  Service: Orthopedics/Podiatry;  Laterality: Right;   CARDIAC CATHETERIZATION     COLONOSCOPY N/A 03/07/2021   Procedure: COLONOSCOPY;  Surgeon: Maryruth Ole DASEN, MD;  Location: [facility] ENDOSCOPY;  Service: Endoscopy;  Laterality: N/A;  IDDM   COLONOSCOPY WITH PROPOFOL  N/A 04/09/2017   Procedure: COLONOSCOPY WITH PROPOFOL ;  Surgeon: Therisa Bi, MD;  Location: [facility] ENDOSCOPY;  Service: Gastroenterology;  Laterality: N/A;   CORONARY ARTERY BYPASS GRAFT  2003   GRAFT APPLICATION  06/17/2023   Procedure: GRAFT APPLICATION WITH DELAYED PRIMARY CLOSURE;  Surgeon: Malvin Marsa FALCON, DPM;   Location: [facility] ORS;  Service: Orthopedics/Podiatry;;   IRRIGATION AND DEBRIDEMENT FOOT Right 06/17/2023   Procedure: IRRIGATION AND DEBRIDEMENT FOOT;  Surgeon: Malvin Marsa FALCON, DPM;  Location: [facility] ORS;  Service: Orthopedics/Podiatry;  Laterality: Right;   JOINT REPLACEMENT     total knee   LOWER EXTREMITY ANGIOGRAPHY Right 06/13/2023   Procedure: Lower Extremity Angiography;  Surgeon: Marea Selinda RAMAN, MD;  Location: [facility] INVASIVE CV LAB;  Service: Cardiovascular;  Laterality: Right;   LOWER EXTREMITY ANGIOGRAPHY Left 08/01/2023   Procedure: Lower Extremity Angiography;  Surgeon: Marea Selinda RAMAN, MD;  Location: [facility] INVASIVE CV LAB;  Service: Cardiovascular;  Laterality: Left;   RENAL ARTERY STENT     due to kidney stones   TRANSMETATARSAL AMPUTATION Right 06/14/2023   Procedure: AMPUTATION, FOOT, TRANSMETATARSAL;  Surgeon: Malvin Marsa FALCON, DPM;  Location: [facility] ORS;  Service: Orthopedics/Podiatry;  Laterality: Right;   WOUND DEBRIDEMENT Right 07/26/2023   Procedure: DEBRIDEMENT, WOUND;  Surgeon: Silva Juliene SAUNDERS, DPM;  Location: [facility] ORS;  Service: Orthopedics/Podiatry;  Laterality: Right;    Family History  Problem Relation Age of Onset   Diabetes Mother    Alcoholism Father    Diabetes Sister    Diabetes Sister    Arrhythmia Sister    Diabetes Sister     No Known  Allergies     Latest Ref Rng & Units 07/01/2023    7:03 PM 06/25/2023    2:46 PM 06/19/2023    3:33 AM  CBC  WBC 4.0 - 10.5 K/uL 7.0  9.4  12.9   Hemoglobin 13.0 - 17.0 g/dL 88.5  88.8  9.8   Hematocrit 39.0 - 52.0 % 35.8  34.0  30.1   Platelets 150 - 400 K/uL 332  431  417       CMP     Component Value Date/Time   NA 134 (L) 07/01/2023 1903   K 4.3 07/01/2023 1903   CL 99 07/01/2023 1903   CO2 25 07/01/2023 1903   GLUCOSE 233 (H) 07/01/2023 1903   BUN 19 08/01/2023 1013   CREATININE 1.18 08/01/2023 1013   CALCIUM  9.0 07/01/2023 1903   PROT 8.6 (H) 06/25/2023 1446   ALBUMIN 2.8 (L) 06/25/2023 1446    AST 25 06/25/2023 1446   ALT 40 06/25/2023 1446   ALKPHOS 73 06/25/2023 1446   BILITOT 0.4 06/25/2023 1446   GFRNONAA >60 08/01/2023 1013     VAS US  ABI WITH/WO TBI Result Date: 07/23/2023  LOWER EXTREMITY DOPPLER STUDY Patient Name:  Adeel Guiffre  Date of Exam:   07/19/2023 Medical Rec #: [id number]          Accession #:    [id number] Date of Birth: 07-Sep-1950         Patient Gender: M Patient Age:   81 years Exam Location:  [facility] Procedure:      VAS US  ABI WITH/WO TBI Referring Phys: SELINDA DEW --------------------------------------------------------------------------------  High Risk Factors: Hypertension, Diabetes, no history of smoking.  Vascular Interventions: 06/13/2023 Stent right SFA , ATA. Performing Technologist: Donnice Charnley RVT  Examination Guidelines: A complete evaluation includes at minimum, Doppler waveform signals and systolic blood pressure reading at the level of bilateral brachial, anterior tibial, and posterior tibial arteries, when vessel segments are accessible. Bilateral testing is considered an integral part of a complete examination. Photoelectric Plethysmograph (PPG) waveforms and toe systolic pressure readings are included as required and additional duplex testing as needed. Limited examinations for reoccurring indications may be performed as noted.  ABI Findings: +---------+------------------+-----+----------+---------------------+ Right    Rt Pressure (mmHg)IndexWaveform  Comment               +---------+------------------+-----+----------+---------------------+ Brachial 140                                                    +---------+------------------+-----+----------+---------------------+ PTA      85                0.61 monophasic                      +---------+------------------+-----+----------+---------------------+ DP       152               1.09 Hyperemic                        +---------+------------------+-----+----------+---------------------+ Great Toe                                 Metatarsal amputation +---------+------------------+-----+----------+---------------------+ +---------+------------------+-----+----------+-------+ Left     Lt Pressure (mmHg)IndexWaveform  Comment +---------+------------------+-----+----------+-------+ Brachial 136                                      +---------+------------------+-----+----------+-------+ PTA      61                0.44 monophasic        +---------+------------------+-----+----------+-------+ DP       77                0.55 monophasic        +---------+------------------+-----+----------+-------+ Great Toe44                0.31                   +---------+------------------+-----+----------+-------+ +-------+-----------+-----------+------------+------------+ ABI/TBIToday's ABIToday's TBIPrevious ABIPrevious TBI +-------+-----------+-----------+------------+------------+ Right  1.09       amputation                          +-------+-----------+-----------+------------+------------+ Left   0.55       0.31                                +-------+-----------+-----------+------------+------------+   Summary: Right: Resting right ankle-brachial index is within normal range. Left: Resting left ankle-brachial index indicates moderate left lower extremity arterial disease. The left toe-brachial index is abnormal. *See table(s) above for measurements and observations.  Electronically signed by Selinda Gu MD on 07/23/2023 at 8:32:53 AM.    Final        Assessment & Plan:   1. Peripheral arterial disease with history of revascularization (HCC) (Primary)  Recommend:  The patient has evidence of atherosclerosis of the lower extremities with claudication.  The patient does not voice lifestyle limiting changes at this point in time.  Noninvasive studies do not suggest clinically  significant change.  No invasive studies, angiography or surgery at this time The patient should continue walking and begin a more formal exercise program.  The patient should continue antiplatelet therapy and aggressive treatment of the lipid abnormalities  No changes in the patient's medications at this time  Continued surveillance is indicated as atherosclerosis is likely to progress with time.    The patient will continue follow up with noninvasive studies as ordered.   2. Primary hypertension Continue antihypertensive medications as already ordered, these medications have been reviewed and there are no changes at this time.  3. Type 2 diabetes mellitus with diabetic peripheral angiopathy and gangrene, with long-term current use of insulin  (HCC) Continue hypoglycemic medications as already ordered, these medications have been reviewed and there are no changes at this time.  Hgb A1C to be monitored as already arranged by primary service   Current Outpatient Medications on File Prior to Visit  Medication Sig Dispense Refill   acetaminophen  (TYLENOL ) 500 MG tablet Take 500 mg by mouth every 6 (six) hours as needed for mild pain (pain score 1-3), headache, fever or moderate pain (pain score 4-6).     aspirin  EC 81 MG tablet Take 1 tablet (81 mg total) by mouth daily. Swallow whole. 150 tablet 2   atorvastatin  (LIPITOR) 40 MG tablet TAKE 1 TABLET BY MOUTH EVERY DAY *NEW PRESCRIPTION REQUEST* 90 tablet 1   carvedilol  (COREG ) 3.125 MG tablet Take 1 tablet by mouth twice daily 180 tablet 0  clopidogrel  (PLAVIX ) 75 MG tablet Take 75 mg by mouth daily.     dapagliflozin  propanediol (FARXIGA ) 10 MG TABS tablet TAKE 1 TABLET BY MOUTH EVERY DAY BEFORE BREAKFAST *NEW PRESCRIPTION REQUEST* 90 tablet 1   furosemide  (LASIX ) 40 MG tablet Take 1 tablet (40 mg total) by mouth daily. 30 tablet 0   glipiZIDE (GLUCOTROL) 5 MG tablet Take 5 mg by mouth daily before breakfast.     insulin  glargine  (LANTUS ) 100 UNIT/ML Solostar Pen Inject 45 Units into the skin at bedtime. 15 mL 0   sacubitril -valsartan  (ENTRESTO ) 97-103 MG Take 1 tablet by mouth 2 (two) times daily. 180 tablet 1   Semaglutide (RYBELSUS) 7 MG TABS Take by mouth daily.     sodium chloride  (OCEAN) 0.65 % SOLN nasal spray Place 1 spray into both nostrils daily as needed for congestion.     spironolactone  (ALDACTONE ) 25 MG tablet Take 1 tablet (25 mg total) by mouth daily. 30 tablet 0   No current facility-administered medications on file prior to visit.    There are no Patient Instructions on file for this visit. No follow-ups on file.   Jody Silas E Hoyle Barkdull, NP

## 2023-12-11 ENCOUNTER — Ambulatory Visit (INDEPENDENT_AMBULATORY_CARE_PROVIDER_SITE_OTHER): Admitting: Podiatry

## 2023-12-11 VITALS — Ht 72.0 in | Wt 243.2 lb

## 2023-12-11 DIAGNOSIS — L97511 Non-pressure chronic ulcer of other part of right foot limited to breakdown of skin: Secondary | ICD-10-CM

## 2023-12-11 LAB — VAS US ABI WITH/WO TBI
Left ABI: 1.16
Right ABI: 0.99

## 2023-12-12 NOTE — Progress Notes (Signed)
  Subjective:  Patient ID: Scott Hale, male    DOB: 07/14/1950,  MRN: [id number]  Chief Complaint  Patient presents with   Wound Check    RM 2 Patient is here for ulcer of the right foot. Wound is closed with no visible drainage. Pt request nail trim of left foot.    DOS: 07/26/2023 Procedure: Right foot debridement and Kerecis application with Prevena wound VAC  73 y.o. male returns for post-op check.    Review of Systems: Negative except as noted in the HPI. Denies N/V/F/Ch.   Objective:   There were no vitals filed for this visit.  Body mass index is 32.98 kg/m. Constitutional Well developed. Well nourished.  Vascular Foot warm and well perfused.  Calf is soft and supple, no posterior calf or knee pain, negative Homans' sign  Neurologic Normal speech. Oriented to person, place, and time. Epicritic sensation to light touch grossly absent bilaterally.  Dermatologic Ulceration is now partial-thickness in nature.  Limited skin breakdown.  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      Assessment:   1. Ulcer of right foot, limited to breakdown of skin [facility])       Plan:  Patient was evaluated and treated and all questions answered.  S/p foot surgery right Ulcer right foot - Wound is nearly fully healed.  Has made good progress.  Should be ready for fitting for amputation filler at next visit. -Debridement as below. -Dressed with Prisma collagen, DSD. -Continue home dressing changes 3 times weekly with Prisma collagen dressings.  Okay to wash foot on day of dressing change.  Once it scabs over may transition to triple antibiotic ointment or Vaseline - Full weightbearing to the foot and surgical shoe -Vascular testing no further intervention is needed -HgbA1c: 8.8%.    Discussed the etiology and treatment options for the condition in detail with the patient.  Recommended debridement of the nails today. Sharp and mechanical debridement performed of all painful and mycotic nails today. Nails debrided in length and thickness using a nail nipper to level of comfort. Follow up as needed for painful nails.          Return in about 3 weeks (around 01/01/2024) for wound care.

## 2023-12-16 ENCOUNTER — Telehealth: Payer: Self-pay | Admitting: Podiatry

## 2023-12-16 NOTE — Telephone Encounter (Signed)
 Needs verbal order: Physical Therapy--1 time per week for 4 weeks. Please call [place] at [facility] at [phone] to provide the verbal order.

## 2023-12-18 ENCOUNTER — Other Ambulatory Visit: Payer: Self-pay | Admitting: Family

## 2023-12-18 ENCOUNTER — Telehealth: Payer: Self-pay | Admitting: Family

## 2023-12-18 NOTE — Telephone Encounter (Signed)
 Called pt. Spoke to him and his significant other. They report that the pt's regular bp is 100-110's/ 60-70's. They said that when the pt stands to do PT, the pt's bp drops and the pt loses consciousness. The pt's pcp suggest we drop the dose of entresto  but did not make official changes. Advice?

## 2023-12-19 MED ORDER — FUROSEMIDE 40 MG PO TABS: 20.0000 mg | ORAL_TABLET | Freq: Every day | ORAL | 0 refills | Status: DC

## 2023-12-19 MED ORDER — SACUBITRIL-VALSARTAN 24-26 MG PO TABS
1.0000 | ORAL_TABLET | Freq: Two times a day (BID) | ORAL | 3 refills | Status: AC
Start: 2023-12-19 — End: ?

## 2023-12-19 NOTE — Telephone Encounter (Signed)
 Spoke to the pt about medication changes. Orders sent to preferred pharmacy. Follow up appointment made. No further questions at this time.

## 2023-12-26 ENCOUNTER — Other Ambulatory Visit (HOSPITAL_COMMUNITY): Payer: Self-pay

## 2023-12-30 ENCOUNTER — Telehealth: Payer: Self-pay | Admitting: Family

## 2023-12-30 NOTE — Telephone Encounter (Signed)
 Called to confirm/remind patient of [facility] on [facility].   Appointment:   [x] Confirmed  [] Left mess   [] No answer/No voice mail  [] VM Full/unable to leave message  [] Phone not in service  Patient reminded to bring all medications and/or complete list.  Confirmed patient has transportation. Gave directions, instructed to utilize valet parking.

## 2023-12-30 NOTE — Progress Notes (Unsigned)
 Advanced Heart Failure Clinic Note     PCP: Valora Agent, MD (last seen 02/25) Primary Cardiologist: Bosie Kent, MD (last seen 09/22)  Chief Complaint:    HPI:  [name] is a 73 y/o male with a history of diabetes, HTN and chronic heart failure.   Was in the ED 10/28/22 due to shortness of breath for a week, worse with exertion along with orthopnea. BNP elevated. Ultrasound negative for DVT.  EKG chest x-ray labs are all unremarkable,   Echo 04/29/17: EF 35% along with mild MR/TR/AR.   Stress test 04/29/17: EF of 31% along with inferolateral T wave inversions with stress.  Echo 03/31/20: EF 55-60% along with mild MR.  Echo 05/14/22: EF 45-50% with mild LAE and mild MR.   Admitted 06/07/23 with 2 week history of right foot pain, swelling and then redness extending up to his knee. IV antibiotics given for sepsis. ID/ podiatry consulted. Right foot MRI showed multiple areas of osteomyelitis. 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 ABI with findings indicative of moderate right and mild left lower extremity peripheral arterial disease. Vascular consulted and S/p angio with stent placement x 2.  Was in the ED 06/25/23 with syncopal episode. Passed out sitting on bench chair in the shower after working with PT. EKG normal. D-dimer elevated. Chest CTA without PE.   Was in the ED 07/01/23 after seeing bleeding from the dressing. Wound dressing hadn't been changed in the last week due to difficulty coordinating schedule.   Had right foot debridement with subsequent wound vac 06/25.  He presents today, with his girlfriend, for a HF follow-up visit with a chief complaint of shortness of breath. He notices discomfort over this left lower breast when he lays down on this left side. It resolves once he gets up. Continues to have a wound on his right foot and he says it is healing. Home health is doing wound care a few times / week. Was going to gym daily prior to the amputation and,  since then, has been non-weight bearing. He feels like his shortness of breath has worsened because of his immobility.   ROS: All systems negative except as listed in HPI, PMH and Problem List.  SH:  Social History   Socioeconomic History   Marital status: Single    Spouse name: Not on file   Number of children: 5   Years of education: 12   Highest education level: 12th grade  Occupational History   Occupation: retired  Tobacco Use   Smoking status: Never   Smokeless tobacco: Never  Vaping Use   Vaping status: Never Used  Substance and Sexual Activity   Alcohol use: No   Drug use: No   Sexual activity: Yes    Birth control/protection: Condom  Other Topics Concern   Not on file  Social History Narrative   Not on file   Social Drivers of Health   Financial Resource Strain: Patient Declined (07/23/2023)   Received from [facility]   Overall Financial Resource Strain (CARDIA)    Difficulty of Paying Living Expenses: Patient declined  Food Insecurity: Patient Declined (07/23/2023)   Received from [facility]   Hunger Vital Sign    Within the past 12 months, you worried that your food would run out before you got the money to buy more.: Patient declined    Within the past 12 months, the food you bought just didn't last and you didn't have money to get more.:  Patient declined  Recent Concern: Food Insecurity - Food Insecurity Present (06/08/2023)   Hunger Vital Sign    Worried About Running Out of Food in the Last Year: Sometimes true    Ran Out of Food in the Last Year: Never true  Transportation Needs: Patient Declined (07/23/2023)   Received from [facility] - Transportation    In the past 12 months, has lack of transportation kept you from medical appointments or from getting medications?: Patient declined    Lack of Transportation (Non-Medical): Patient declined  Physical Activity: Sufficiently Active  (03/26/2017)   Exercise Vital Sign    Days of Exercise per Week: 7 days    Minutes of Exercise per Session: 60 min  Stress: No Stress Concern Present (03/26/2017)   [employer] of Occupational Health - Occupational Stress Questionnaire    Feeling of Stress : Only a little  Social Connections: Moderately Isolated (06/08/2023)   Social Connection and Isolation Panel    Frequency of Communication with Friends and Family: More than three times a week    Frequency of Social Gatherings with Friends and Family: Once a week    Attends Religious Services: More than 4 times per year    Active Member of [employer] or Organizations: No    Attends Banker Meetings: Never    Marital Status: Never married  Intimate Partner Violence: Not At Risk (06/08/2023)   Humiliation, Afraid, Rape, and Kick questionnaire    Fear of Current or Ex-Partner: No    Emotionally Abused: No    Physically Abused: No    Sexually Abused: No    FH:  Family History  Problem Relation Age of Onset   Diabetes Mother    Alcoholism Father    Diabetes Sister    Diabetes Sister    Arrhythmia Sister    Diabetes Sister     Past Medical History:  Diagnosis Date   Arthritis    CHF (congestive heart failure) (HCC)    Diabetes mellitus without complication (HCC)    Erectile dysfunction    History of kidney stones    Hypertension    Sleep apnea     Current Outpatient Medications  Medication Sig Dispense Refill   acetaminophen  (TYLENOL ) 500 MG tablet Take 500 mg by mouth every 6 (six) hours as needed for mild pain (pain score 1-3), headache, fever or moderate pain (pain score 4-6).     aspirin  EC 81 MG tablet Take 1 tablet (81 mg total) by mouth daily. Swallow whole. 150 tablet 2   atorvastatin  (LIPITOR) 40 MG tablet TAKE 1 TABLET BY MOUTH EVERY DAY *NEW PRESCRIPTION REQUEST* 90 tablet 1   carvedilol  (COREG ) 3.125 MG tablet Take 1 tablet by mouth twice daily 180 tablet 0   clopidogrel  (PLAVIX ) 75 MG tablet Take  75 mg by mouth daily.     dapagliflozin  propanediol (FARXIGA ) 10 MG TABS tablet TAKE 1 TABLET BY MOUTH EVERY DAY BEFORE BREAKFAST *NEW PRESCRIPTION REQUEST* 90 tablet 1   furosemide  (LASIX ) 40 MG tablet Take 0.5 tablets (20 mg total) by mouth daily. 45 tablet 0   glipiZIDE (GLUCOTROL) 5 MG tablet Take 5 mg by mouth daily before breakfast.     insulin  glargine (LANTUS ) 100 UNIT/ML Solostar Pen Inject 45 Units into the skin at bedtime. 15 mL 0   sacubitril -valsartan  (ENTRESTO ) 24-26 MG Take 1 tablet by mouth 2 (two) times daily. 180 tablet 3   Semaglutide (RYBELSUS) 7 MG TABS Take by mouth  daily.     sodium chloride  (OCEAN) 0.65 % SOLN nasal spray Place 1 spray into both nostrils daily as needed for congestion.     spironolactone  (ALDACTONE ) 25 MG tablet Take 1 tablet (25 mg total) by mouth daily. 30 tablet 0   No current facility-administered medications for this visit.   There were no vitals filed for this visit.  Wt Readings from Last 3 Encounters:  12/11/23 243 lb 3.2 oz (110.3 kg)  12/06/23 243 lb 3.2 oz (110.3 kg)  12/04/23 242 lb (109.8 kg)   Lab Results  Component Value Date   CREATININE 1.18 08/01/2023   CREATININE 1.42 (H) 07/01/2023   CREATININE 1.40 (H) 06/25/2023     PHYSICAL EXAM:  General: Well appearing male in wheelchair.  Cor: No JVD. Regular rhythm, rate.  Lungs: clear Abdomen: soft, nontender, nondistended. Extremities: right foot wrapped in bandage/ kerlix and has a boot on Neuro:. Affect pleasant   ECG: not done   ASSESSMENT & PLAN:  1: NICM with mildly reduced ejection fraction- - etiology likely d/t HTN - NYHA class II - euvolemic - weight unchanged from last visit here 6 months ago - Echo 03/31/20: EF 55-60% along with mild MR. - Echo 05/14/22: EF 45-50% with mild LAE and mild MR.  - will discuss getting echo updated at next visit - has not been able to weight bear since his right foot amputation and notices that he gets SOB now from being  inactive - not adding salt to his food. Reviewed the importance of closely following a 2000mg  sodium diet  - does eat out occasionally  - keeping daily fluid intake to 60-64 oz - saw cardiology (Fath) 09/22; needs card referral but will defer today - continue carvedilol  3.125mg  BID - continue farxiga  10mg  daily - continue furosemide  40mg  daily  - continue entresto  97/103mg  BID - continue spironolactone  25mg  daily - BNP 11/12/22 was 1639.6  2: HTN- - BP 104/79 - saw PCP Loree) 06/25 - BMP 11/29/23 reviewed: sodium 137, potassium 4.8, creatinine 1.2 and GFR 65  3: Diabetes-  - A1c 11/29/23 was 8.8% - saw endocrinology (Solum) 07/25 - taking 80 units of lantus  at bedtime  4: 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   I spent 35 minutes reviewing records, interviewing/ examing patient and managing plan/ orders.   Ellouise DELENA Class, FNP 12/30/23

## 2023-12-31 ENCOUNTER — Encounter: Payer: Self-pay | Admitting: Family

## 2023-12-31 ENCOUNTER — Ambulatory Visit: Admitting: Family

## 2023-12-31 ENCOUNTER — Other Ambulatory Visit
Admission: RE | Admit: 2023-12-31 | Discharge: 2023-12-31 | Disposition: A | Discharge status: Home / Self Care | Source: Ambulatory Visit | Attending: Family | Admitting: Family

## 2023-12-31 ENCOUNTER — Ambulatory Visit: Payer: Self-pay | Admitting: Family

## 2023-12-31 VITALS — BP 116/79 | HR 71 | Wt 244.2 lb

## 2023-12-31 DIAGNOSIS — M869 Osteomyelitis, unspecified: Secondary | ICD-10-CM

## 2023-12-31 DIAGNOSIS — I5022 Chronic systolic (congestive) heart failure: Secondary | ICD-10-CM

## 2023-12-31 DIAGNOSIS — E119 Type 2 diabetes mellitus without complications: Secondary | ICD-10-CM | POA: Diagnosis not present

## 2023-12-31 DIAGNOSIS — I10 Essential (primary) hypertension: Secondary | ICD-10-CM

## 2023-12-31 DIAGNOSIS — Z794 Long term (current) use of insulin: Secondary | ICD-10-CM

## 2023-12-31 LAB — BASIC METABOLIC PANEL WITH GFR
Anion gap: 10 (ref 5–15)
BUN: 14 mg/dL (ref 8–23)
CO2: 27 mmol/L (ref 22–32)
Calcium: 9.5 mg/dL (ref 8.9–10.3)
Chloride: 99 mmol/L (ref 98–111)
Creatinine, Ser: 1.16 mg/dL (ref 0.61–1.24)
GFR, Estimated: >60 mL/min (ref >60)
Glucose, Bld: 137 mg/dL — ABNORMAL HIGH (ref 70–99)
Potassium: 4.4 mmol/L (ref 3.5–5.1)
Sodium: 136 mmol/L (ref 135–145)

## 2023-12-31 MED ORDER — FUROSEMIDE 40 MG PO TABS: 20.0000 mg | ORAL_TABLET | ORAL | Status: DC | PRN

## 2023-12-31 NOTE — Patient Instructions (Signed)
 Medication Changes:  CHANGE Furosemide  to 1/2 tab as needed for weight gain or shortness of breath  Lab Work:  Go over to the [facility]. Go pass the gift shop and have your blood work completed.  We will only call you if the results are abnormal or if the provider would like to make medication changes.  No news is good news.     Testing/Procedures:  Your physician has requested that you have an echocardiogram. Echocardiography is a painless test that uses sound waves to create images of your heart. It provides your doctor with information about the size and shape of your heart and how well your heart's chambers and valves are working. This procedure takes approximately one hour. There are no restrictions for this procedure. Please do NOT wear cologne, perfume, aftershave, or lotions (deodorant is allowed). Please arrive 15 minutes prior to your appointment time.  Please note: We ask at that you not bring children with you during ultrasound (echo/ vascular) testing. Due to room size and safety concerns, children are not allowed in the ultrasound rooms during exams. Our front office staff cannot provide observation of children in our lobby area while testing is being conducted. An adult accompanying a patient to their appointment will only be allowed in the ultrasound room at the discretion of the ultrasound technician under special circumstances. We apologize for any inconvenience.  Someone will contact you in order to schedule your appointment.   Referrals:  You have been referred to [facility]. They will call you in order to schedule your appointment.    Follow-Up in: Please follow up with the Advanced Heart Failure Clinic in 1 month with Ellouise Class, FNP   Thank you for choosing [facility].    At the Advanced Heart Failure Clinic, you and your health needs are our priority. We have a designated team specialized in the  treatment of Heart Failure. This Care Team includes your primary Heart Failure Specialized Cardiologist (physician), Advanced Practice Providers (APPs- Physician Assistants and Nurse Practitioners), and Pharmacist who all work together to provide you with the care you need, when you need it.   You may see any of the following providers on your designated Care Team at your next follow up:  Dr. Toribio Fuel Dr. Ezra Shuck Dr. Ria Commander Dr. Morene Brownie Ellouise Class, FNP Jaun Bash, RPH-CPP  Please be sure to bring in all your medications bottles to every appointment.   Need to Contact Us :  If you have any questions or concerns before your next appointment please send us  a message through [facility] or call our office at [phone].    TO LEAVE A MESSAGE FOR THE NURSE SELECT OPTION 2, PLEASE LEAVE A MESSAGE INCLUDING: YOUR NAME DATE OF BIRTH CALL BACK NUMBER REASON FOR CALL**this is important as we prioritize the call backs  YOU WILL RECEIVE A CALL BACK THE SAME DAY AS LONG AS YOU CALL BEFORE 4:00 PM

## 2023-12-31 NOTE — Addendum Note (Signed)
 Addended by: SHARL GRATE A on: 12/31/2023 01:38 PM   Modules accepted: Orders

## 2024-01-01 ENCOUNTER — Ambulatory Visit: Admitting: Podiatry

## 2024-01-01 VITALS — Ht 72.0 in | Wt 244.2 lb

## 2024-01-01 DIAGNOSIS — L97511 Non-pressure chronic ulcer of other part of right foot limited to breakdown of skin: Secondary | ICD-10-CM

## 2024-01-01 NOTE — Progress Notes (Signed)
  Subjective:  Patient ID: Scott Hale, male    DOB: 05/18/1950,  MRN: [id number]  Chief Complaint  Patient presents with   Wound Check    RM 2 Patient si here for ulcer of the right foot. Wound is closed with visible drainage.     DOS: 07/26/2023 Procedure: Right foot debridement and Kerecis application with Prevena wound VAC  73 y.o. male returns for post-op check.  Doing well they have not seen any drainage  Review of Systems: Negative except as noted in the HPI. Denies N/V/F/Ch.   Objective:   There were no vitals filed for this visit.  Body mass index is 33.12 kg/m. Constitutional Well developed. Well nourished.  Vascular Foot warm and well perfused.  Calf is soft and supple, no posterior calf or knee pain, negative Homans' sign  Neurologic Normal speech. Oriented to person, place, and time. Epicritic sensation to light touch grossly absent bilaterally.  Dermatologic Ulceration is fully healed with a exception of a very small area on the lateral side with limited skin breakdown  Orthopedic: No pain to palpation noted about the surgical site.      Assessment:   1. Ulcer of right foot, limited to breakdown of skin [facility])       Plan:  Patient was evaluated and treated and all questions answered.  S/p foot surgery right Ulcer right foot - Doing very well.  May wash of the foot regularly and apply lotion as needed.  Neosporin and a Band-Aid to the small side and lateral side that does not need wraps or bandages.  Weightbearing as tolerated may gradual return to regular shoe gear utilize a sock now for shoe filler we will have him scheduled for amputation foot with our pedorthist.  Okay to return to activity at the gym low impact minimal weightbearing exercise such as elliptical stationary bike and cable machines.  Follow-up in 4 weeks to reevaluate.         Return in about 4 weeks (around 01/29/2024) for wound check.

## 2024-01-10 ENCOUNTER — Other Ambulatory Visit: Payer: Self-pay | Admitting: Cardiology

## 2024-01-17 ENCOUNTER — Other Ambulatory Visit: Payer: Self-pay | Admitting: Family

## 2024-01-24 ENCOUNTER — Ambulatory Visit

## 2024-01-24 DIAGNOSIS — Z794 Long term (current) use of insulin: Secondary | ICD-10-CM

## 2024-01-24 DIAGNOSIS — S98131A Complete traumatic amputation of one right lesser toe, initial encounter: Secondary | ICD-10-CM

## 2024-01-24 DIAGNOSIS — M2142 Flat foot [pes planus] (acquired), left foot: Secondary | ICD-10-CM

## 2024-01-24 NOTE — Progress Notes (Signed)
 Visit:  ORTHOTIC SCAN/ EVALUATION  Patient presented for evaluation/ scan for accommodative insert left and  toe filler right foot.    Patient will benefit from custom accommodative insert left to provide total contact to medial longitudinal arch to help balance and distribute body weight more evenly.   Device will encourage forefoot and rearfoot alignment.     Toe filller to help with shoe fit and decrease pressure from shear forces for the right at prior transmetatarsal amputation site  Patient was scanned today with [facility] scanner.  Order entered via ohi portal   When the toe filler and insert is ready for pick up, will call to make an appointment for a fitting.    Hikeem Andersson, DPM

## 2024-01-29 ENCOUNTER — Telehealth: Payer: Self-pay | Admitting: Family

## 2024-01-29 ENCOUNTER — Ambulatory Visit: Admitting: Podiatry

## 2024-01-29 NOTE — Progress Notes (Unsigned)
 Advanced [facility] Failure Clinic Note     PCP: Valora Agent, MD (last seen 02/25) Primary Cardiologist: Bosie Kent, MD (inactive)   Chief Complaint: shortness of breath   HPI:  Scott Hale is a 73 y/o male with a history of diabetes, HTN and chronic heart failure.   Was in the ED 10/28/22 due to shortness of breath for a week, worse with exertion along with orthopnea. BNP elevated. Ultrasound negative for DVT.  EKG chest x-ray labs are all unremarkable,   Echo 04/29/17: EF 35% along with mild Scott/TR/AR.   Stress test 04/29/17: EF of 31% along with inferolateral T wave inversions with stress.  Echo 03/31/20: EF 55-60% along with mild Scott.  Echo 05/14/22: EF 45-50% with mild LAE and mild Scott.   Admitted 06/07/23 with 2 week history of right foot pain, swelling and then redness extending up to his knee. IV antibiotics given for sepsis. ID/ podiatry consulted. Right foot MRI showed multiple areas of osteomyelitis. 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 ABI with findings indicative of moderate right and mild left lower extremity peripheral arterial disease. Vascular consulted and S/p angio with stent placement x 2.  Was in the ED 06/25/23 with syncopal episode. Passed out sitting on bench chair in the shower after working with PT. EKG normal. D-dimer elevated. Chest CTA without PE.   Was in the ED 07/01/23 after seeing bleeding from the dressing. Wound dressing hadn't been changed in the last week due to difficulty coordinating schedule.   Had right foot debridement with subsequent wound vac 06/25.  He presents today, with his girlfriend, for a HF follow-up visit with a chief complaint of minimal shortness of breath. Has associated fatigue, rare chest pain, dizziness with position changes. He feels like he's eating / drinking well. Notes dizziness when standing up or when working with PT. No falls.   ROS: All systems negative except as listed in HPI, PMH and Problem List.  SH:   Social History   Socioeconomic History   Marital status: Single    Spouse name: Not on file   Number of children: 5   Years of education: 12   Highest education level: 12th grade  Occupational History   Occupation: retired  Tobacco Use   Smoking status: Never   Smokeless tobacco: Never  Vaping Use   Vaping status: Never Used  Substance and Sexual Activity   Alcohol use: No   Drug use: No   Sexual activity: Yes    Birth control/protection: Condom  Other Topics Concern   Not on file  Social History Narrative   Not on file   Social Drivers of Health   Tobacco Use: Low Risk (12/31/2023)   Patient History    Smoking Tobacco Use: Never    Smokeless Tobacco Use: Never    Passive Exposure: Not on file  Financial Resource Strain: Patient Declined (07/23/2023)   Received from [facility]   Overall Financial Resource Strain (CARDIA)    Difficulty of Paying Living Expenses: Patient declined  Food Insecurity: Patient Declined (07/23/2023)   Received from [facility]   Epic    Within the past 12 months, you worried that your food would run out before you got the money to buy more.: Patient declined    Within the past 12 months, the food you bought just didn't last and you didn't have money to get more.: Patient declined  Recent Concern: Food Insecurity - Food Insecurity Present (06/08/2023)   Hunger  Vital Sign    Worried About Programme Researcher, Broadcasting/film/video in the Last Year: Sometimes true    Ran Out of Food in the Last Year: Never true  Transportation Needs: Patient Declined (07/23/2023)   Received from [facility] - Transportation    In the past 12 months, has lack of transportation kept you from medical appointments or from getting medications?: Patient declined    Lack of Transportation (Non-Medical): Patient declined  Physical Activity: Not on file  Stress: Not on file  Social Connections: Moderately Isolated (06/08/2023)    Social Connection and Isolation Panel    Frequency of Communication with Friends and Family: More than three times a week    Frequency of Social Gatherings with Friends and Family: Once a week    Attends Religious Services: More than 4 times per year    Active Member of [employer] or Organizations: No    Attends Banker Meetings: Never    Marital Status: Never married  Intimate Partner Violence: Not At Risk (06/08/2023)   Humiliation, Afraid, Rape, and Kick questionnaire    Fear of Current or Ex-Partner: No    Emotionally Abused: No    Physically Abused: No    Sexually Abused: No  Depression (PHQ2-9): Low Risk (09/25/2021)   Depression (PHQ2-9)    PHQ-2 Score: 0  Alcohol Screen: Not on file  Housing: Patient Declined (07/23/2023)   Received [facility]   Epic    In the last 12 months, was there a time when you were not able to pay the mortgage or rent on time?: Patient declined    Number of Times Moved in the Last Year: Not on file    At any time in the past 12 months, were you homeless or living in a shelter (including now)?: Patient declined  Recent Concern: Housing - High Risk (05/27/2023)   Received from [facility]   Housing Stability Vital Sign    Unable to Pay for Housing in the Last Year: Yes    Number of Times Moved in the Last Year: Not on file    Homeless in the Last Year: No  Utilities: Patient Declined (07/23/2023)   Received from [facility]   Epic    In the past 12 months has the electric, gas, oil, or water company threatened to shut off services in your home?: Patient declined  Recent Concern: Utilities - At Risk (06/08/2023)   [employer] Utilities    Threatened with loss of utilities: Yes  Health Literacy: Not on file    FH:  Family History  Problem Relation Age of Onset   Diabetes Mother    Alcoholism Father    Diabetes Sister    Diabetes Sister    Arrhythmia Sister    Diabetes Sister     Past  Medical History:  Diagnosis Date   Arthritis    CHF (congestive heart failure) (HCC)    Diabetes mellitus without complication (HCC)    Erectile dysfunction    History of kidney stones    Hypertension    Sleep apnea     Current Outpatient Medications  Medication Sig Dispense Refill   acetaminophen  (TYLENOL ) 500 MG tablet Take 500 mg by mouth every 6 (six) hours as needed for mild pain (pain score 1-3), headache, fever or moderate pain (pain score 4-6).     aspirin  EC 81 MG tablet Take 1 tablet (81 mg total) by mouth  daily. Swallow whole. 150 tablet 2   atorvastatin  (LIPITOR) 40 MG tablet TAKE 1 TABLET BY MOUTH EVERY DAY 90 tablet 1   carvedilol  (COREG ) 3.125 MG tablet Take 1 tablet by mouth twice daily 180 tablet 0   clopidogrel  (PLAVIX ) 75 MG tablet Take 75 mg by mouth daily.     dapagliflozin  propanediol (FARXIGA ) 10 MG TABS tablet TAKE 1 TABLET BY MOUTH EVERY DAY BEFORE BREAKFAST *NEW PRESCRIPTION REQUEST* 90 tablet 1   furosemide  (LASIX ) 40 MG tablet Take 0.5 tablets (20 mg total) by mouth as needed.     glipiZIDE (GLUCOTROL) 5 MG tablet Take 5 mg by mouth daily before breakfast.     insulin  glargine (LANTUS ) 100 UNIT/ML Solostar Pen Inject 45 Units into the skin at bedtime. 15 mL 0   sacubitril -valsartan  (ENTRESTO ) 24-26 MG Take 1 tablet by mouth 2 (two) times daily. 180 tablet 3   Semaglutide (RYBELSUS) 7 MG TABS Take by mouth daily.     sodium chloride  (OCEAN) 0.65 % SOLN nasal spray Place 1 spray into both nostrils daily as needed for congestion.     spironolactone  (ALDACTONE ) 25 MG tablet Take 1 tablet (25 mg total) by mouth daily. 30 tablet 0   No current facility-administered medications for this visit.   There were no vitals filed for this visit.  Wt Readings from Last 3 Encounters:  01/01/24 244 lb 3.2 oz (110.8 kg)  12/31/23 244 lb 3.2 oz (110.8 kg)  12/11/23 243 lb 3.2 oz (110.3 kg)   Lab Results  Component Value Date   CREATININE 1.16 12/31/2023   CREATININE  1.18 08/01/2023   CREATININE 1.42 (H) 07/01/2023     PHYSICAL EXAM:  General: Well appearing male in wheelchair  Cor: No JVD. Regular rhythm, rate.  Lungs: clear Abdomen: soft, nontender, nondistended. Extremities: no edema. Right foot wrapped in kerlix and boot on.  Neuro:. Affect pleasant   ECG: not done   ASSESSMENT & PLAN:  1: NICM with mildly reduced ejection fraction- - etiology likely d/t HTN - NYHA Hale II - euvolemic - Echo 03/31/20: EF 55-60% along with mild Scott. - Echo 05/14/22: EF 45-50% with mild LAE and mild Scott.  - will order updated echo - has not been able to weight bear since his right foot amputation and notices that he gets SOB now from being inactive - not adding salt to his food. Reviewed the importance of closely following a 2000mg  sodium diet  - does eat out occasionally  - keeping daily fluid intake to 60-64 oz - saw cardiology (Fath) 09/22; refer to [facility] cardiology - continue carvedilol  3.125mg  BID - continue farxiga  10mg  daily - Change furosemide  to 20mg  daily PRN for weight gain or worsening SOB  - continue entresto  24/26mg  BID - continue spironolactone  25mg  daily - BNP 11/12/22 was 1639.6  2: HTN- - BP 116/79 - saw PCP Loree) 06/25 - BMP 11/29/23 reviewed: sodium 137, potassium 4.8, creatinine 1.2 and GFR 65 - BMET today  3: Diabetes-  - A1c 11/29/23 was 8.8% - saw endocrinology (Solum) 10/25 - taking 80 units of lantus  at bedtime  4: 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   I spent 25 minutes reviewing records, interviewing/ examing patient and managing plan/ orders.  Scott DELENA Class, FNP 01/29/2024

## 2024-01-29 NOTE — Telephone Encounter (Signed)
 Called to confirm/remind patient of their appointment at the [facility] Clinic on 01/30/24.   Appointment:   [x] Confirmed  [] Left mess   [] No answer/No voice mail  [] VM Full/unable to leave message  [] Phone not in service  Patient reminded to bring all medications and/or complete list.  Confirmed patient has transportation. Gave directions, instructed to utilize valet parking.

## 2024-01-30 ENCOUNTER — Encounter: Payer: Self-pay | Admitting: Family

## 2024-01-30 ENCOUNTER — Ambulatory Visit: Attending: Family | Admitting: Family

## 2024-01-30 ENCOUNTER — Other Ambulatory Visit: Payer: Self-pay

## 2024-01-30 VITALS — BP 104/74 | HR 69 | Wt 240.0 lb

## 2024-01-30 DIAGNOSIS — E119 Type 2 diabetes mellitus without complications: Secondary | ICD-10-CM | POA: Diagnosis not present

## 2024-01-30 DIAGNOSIS — Z833 Family history of diabetes mellitus: Secondary | ICD-10-CM | POA: Insufficient documentation

## 2024-01-30 DIAGNOSIS — Z7984 Long term (current) use of oral hypoglycemic drugs: Secondary | ICD-10-CM | POA: Insufficient documentation

## 2024-01-30 DIAGNOSIS — Z794 Long term (current) use of insulin: Secondary | ICD-10-CM | POA: Insufficient documentation

## 2024-01-30 DIAGNOSIS — Z7982 Long term (current) use of aspirin: Secondary | ICD-10-CM | POA: Insufficient documentation

## 2024-01-30 DIAGNOSIS — Z7902 Long term (current) use of antithrombotics/antiplatelets: Secondary | ICD-10-CM | POA: Insufficient documentation

## 2024-01-30 DIAGNOSIS — M869 Osteomyelitis, unspecified: Secondary | ICD-10-CM | POA: Diagnosis not present

## 2024-01-30 DIAGNOSIS — Z79899 Other long term (current) drug therapy: Secondary | ICD-10-CM | POA: Insufficient documentation

## 2024-01-30 DIAGNOSIS — I10 Essential (primary) hypertension: Secondary | ICD-10-CM | POA: Diagnosis not present

## 2024-01-30 DIAGNOSIS — I5022 Chronic systolic (congestive) heart failure: Secondary | ICD-10-CM | POA: Diagnosis not present

## 2024-01-30 DIAGNOSIS — I110 Hypertensive heart disease with heart failure: Secondary | ICD-10-CM | POA: Insufficient documentation

## 2024-01-30 DIAGNOSIS — I428 Other cardiomyopathies: Secondary | ICD-10-CM | POA: Diagnosis not present

## 2024-01-30 DIAGNOSIS — I509 Heart failure, unspecified: Secondary | ICD-10-CM | POA: Insufficient documentation

## 2024-01-30 MED ORDER — FUROSEMIDE 40 MG PO TABS: 20.0000 mg | ORAL_TABLET | ORAL | 1 refills | Status: DC | PRN

## 2024-01-30 NOTE — Patient Instructions (Addendum)
 Take the lasix  if you need it for overnight weight gain of 3 pounds or more or for worsening shortness of breath, swelling.

## 2024-01-30 NOTE — Telephone Encounter (Signed)
 Received call from [facility] pharmacy requesting medication refills. Pt to have appt today. Will verify doses and refill meds at appt because the dosages didn't match.

## 2024-02-03 ENCOUNTER — Ambulatory Visit (INDEPENDENT_AMBULATORY_CARE_PROVIDER_SITE_OTHER): Admitting: Podiatry

## 2024-02-03 VITALS — Ht 72.0 in | Wt 240.0 lb

## 2024-02-03 DIAGNOSIS — L97511 Non-pressure chronic ulcer of other part of right foot limited to breakdown of skin: Secondary | ICD-10-CM

## 2024-02-04 NOTE — Progress Notes (Signed)
"  °  Subjective:  Patient ID: Scott Hale, male    DOB: 1950/06/24,  MRN: [id number]  Chief Complaint  Patient presents with   Wound Check    RM 7 Patient is here for ulcer of the right foot. Wo9und is closed with some minor scabbing, no drainage.    DOS: 07/26/2023 Procedure: Right foot debridement and Kerecis application with Prevena wound VAC  73 y.o. male returns for post-op check.  Doing well they have not seen any drainage  Review of Systems: Negative except as noted in the HPI. Denies N/V/F/Ch.   Objective:   There were no vitals filed for this visit.  Body mass index is 32.55 kg/m. Constitutional Well developed. Well nourished.  Vascular Foot warm and well perfused.  Calf is soft and supple, no posterior calf or knee pain, negative Homans' sign  Neurologic Normal speech. Oriented to person, place, and time. Epicritic sensation to light touch grossly absent bilaterally.  Dermatologic Ulceration is fully healed   Orthopedic: No pain to palpation noted about the surgical site.      Assessment:   1. Ulcer of right foot, limited to breakdown of skin [facility])       Plan:  Patient was evaluated and treated and all questions answered.  S/p foot surgery right Ulcer right foot Doing very well has healed.  He has been fitted for an amputation filler and is due to receive it next month.  Activity as tolerated.  Return in 8 weeks for reevaluation.        No follow-ups on file.  "

## 2024-02-05 ENCOUNTER — Other Ambulatory Visit: Payer: Self-pay

## 2024-02-05 MED ORDER — FUROSEMIDE 40 MG PO TABS: 20.0000 mg | ORAL_TABLET | Freq: Every day | ORAL | 1 refills | Status: AC | PRN

## 2024-02-11 ENCOUNTER — Ambulatory Visit

## 2024-02-11 DIAGNOSIS — I5022 Chronic systolic (congestive) heart failure: Secondary | ICD-10-CM | POA: Diagnosis not present

## 2024-02-11 LAB — ECHOCARDIOGRAM COMPLETE
AR max vel: 2.57 cm2
AV Area VTI: 2.33 cm2
AV Area mean vel: 2.43 cm2
AV Mean grad: 3 mmHg
AV Peak grad: 5.6 mmHg
Ao pk vel: 1.18 m/s
Area-P 1/2: 2.66 cm2
S' Lateral: 4.2 cm
Single Plane A4C EF: 40.2 %

## 2024-02-12 ENCOUNTER — Telehealth: Payer: Self-pay | Admitting: Podiatry

## 2024-02-12 NOTE — Telephone Encounter (Signed)
 Inserts are in [place] office. Left a message for Kiron to schedule appt for fitting.

## 2024-02-12 NOTE — Telephone Encounter (Signed)
 Patient's significant other Elly) called in regards to picking up orthotics and stated that they won't be able to make it to the [place], and will need to pick them up in [place].

## 2024-02-21 ENCOUNTER — Ambulatory Visit: Admitting: Podiatry

## 2024-02-21 DIAGNOSIS — E1152 Type 2 diabetes mellitus with diabetic peripheral angiopathy with gangrene: Secondary | ICD-10-CM

## 2024-02-21 DIAGNOSIS — S98131A Complete traumatic amputation of one right lesser toe, initial encounter: Secondary | ICD-10-CM

## 2024-02-21 DIAGNOSIS — Z794 Long term (current) use of insulin: Secondary | ICD-10-CM

## 2024-02-21 NOTE — Progress Notes (Signed)
 Patient comes in today to pick up his custom inserts for his shoes. The right one with the toe filler fits snugly in his shoes and is very comfortable for the patient. I also put in one of the left inserts in left shoe. Advised to change the left one out every 4 months. Patient is asking for a second right insert with toe filler - it will be difficult for the patient to switch the right one from shoe to shoe

## 2024-03-17 ENCOUNTER — Telehealth: Payer: Self-pay

## 2024-03-17 ENCOUNTER — Encounter (HOSPITAL_COMMUNITY): Payer: Self-pay

## 2024-03-17 NOTE — Telephone Encounter (Signed)
 Advanced Heart Failure Patient Advocate Encounter  This patient was renewed for a [employer] grant that will help cover the cost of Carvedilol , Entresto , Farxiga , Spironolactone .  Total amount awarded, $7,500.  Effective: 02/16/2024 - 02/14/2025.  BIN [id number] PCN [id number] [id number] ID [id number]  Pharmacy provided with approval and processing information. Patient informed via MyChart.  Rachel DEL, CPhT Rx Patient Advocate Phone: [phone]

## 2024-03-30 ENCOUNTER — Ambulatory Visit: Admitting: Podiatry

## 2024-06-03 ENCOUNTER — Ambulatory Visit: Admitting: Family

## 2024-06-05 ENCOUNTER — Encounter (INDEPENDENT_AMBULATORY_CARE_PROVIDER_SITE_OTHER)

## 2024-06-05 ENCOUNTER — Ambulatory Visit (INDEPENDENT_AMBULATORY_CARE_PROVIDER_SITE_OTHER): Admitting: Nurse Practitioner
